# Patient Record
Sex: Female | Born: 1945 | Race: White | Hispanic: Yes | Smoking: Never smoker
Health system: Southern US, Community
[De-identification: ages and names within clinical notes are randomized; demographics above are authoritative.]

## PROBLEM LIST (undated history)

## (undated) DIAGNOSIS — E785 Hyperlipidemia, unspecified: Secondary | ICD-10-CM

## (undated) DIAGNOSIS — R7303 Prediabetes: Secondary | ICD-10-CM

## (undated) DIAGNOSIS — Z9889 Other specified postprocedural states: Secondary | ICD-10-CM

## (undated) DIAGNOSIS — M1990 Unspecified osteoarthritis, unspecified site: Secondary | ICD-10-CM

## (undated) DIAGNOSIS — R112 Nausea with vomiting, unspecified: Secondary | ICD-10-CM

## (undated) DIAGNOSIS — I10 Essential (primary) hypertension: Secondary | ICD-10-CM

## (undated) DIAGNOSIS — K649 Unspecified hemorrhoids: Secondary | ICD-10-CM

## (undated) DIAGNOSIS — G510 Bell's palsy: Secondary | ICD-10-CM

## (undated) DIAGNOSIS — M810 Age-related osteoporosis without current pathological fracture: Secondary | ICD-10-CM

## (undated) HISTORY — DX: Unspecified hemorrhoids: K64.9

## (undated) HISTORY — DX: Bell's palsy: G51.0

## (undated) HISTORY — DX: Hyperlipidemia, unspecified: E78.5

## (undated) HISTORY — DX: Prediabetes: R73.03

## (undated) HISTORY — DX: Age-related osteoporosis without current pathological fracture: M81.0

## (undated) HISTORY — DX: Essential (primary) hypertension: I10

---

## 2006-10-15 ENCOUNTER — Emergency Department (HOSPITAL_COMMUNITY): Admission: EM | Admit: 2006-10-15 | Discharge: 2006-10-16 | Payer: Self-pay | Admitting: Emergency Medicine

## 2009-03-28 ENCOUNTER — Ambulatory Visit: Payer: Self-pay | Admitting: Family Medicine

## 2009-04-06 ENCOUNTER — Ambulatory Visit: Payer: Self-pay | Admitting: *Deleted

## 2013-04-09 DIAGNOSIS — Z131 Encounter for screening for diabetes mellitus: Secondary | ICD-10-CM | POA: Diagnosis not present

## 2013-04-09 DIAGNOSIS — Z13220 Encounter for screening for lipoid disorders: Secondary | ICD-10-CM | POA: Diagnosis not present

## 2013-04-09 DIAGNOSIS — I10 Essential (primary) hypertension: Secondary | ICD-10-CM | POA: Diagnosis not present

## 2013-04-09 DIAGNOSIS — M2550 Pain in unspecified joint: Secondary | ICD-10-CM | POA: Diagnosis not present

## 2013-05-10 DIAGNOSIS — Z1211 Encounter for screening for malignant neoplasm of colon: Secondary | ICD-10-CM | POA: Diagnosis not present

## 2013-05-10 DIAGNOSIS — M2550 Pain in unspecified joint: Secondary | ICD-10-CM | POA: Diagnosis not present

## 2013-05-10 DIAGNOSIS — E669 Obesity, unspecified: Secondary | ICD-10-CM | POA: Diagnosis not present

## 2013-05-10 DIAGNOSIS — I10 Essential (primary) hypertension: Secondary | ICD-10-CM | POA: Diagnosis not present

## 2013-08-09 DIAGNOSIS — I10 Essential (primary) hypertension: Secondary | ICD-10-CM | POA: Diagnosis not present

## 2013-08-09 DIAGNOSIS — Z1239 Encounter for other screening for malignant neoplasm of breast: Secondary | ICD-10-CM | POA: Diagnosis not present

## 2013-08-09 DIAGNOSIS — M2550 Pain in unspecified joint: Secondary | ICD-10-CM | POA: Diagnosis not present

## 2013-08-09 DIAGNOSIS — Z23 Encounter for immunization: Secondary | ICD-10-CM | POA: Diagnosis not present

## 2013-08-18 DIAGNOSIS — E782 Mixed hyperlipidemia: Secondary | ICD-10-CM | POA: Diagnosis not present

## 2013-08-18 DIAGNOSIS — I10 Essential (primary) hypertension: Secondary | ICD-10-CM | POA: Diagnosis not present

## 2013-08-18 DIAGNOSIS — Z0000 Encounter for general adult medical examination without abnormal findings: Secondary | ICD-10-CM | POA: Diagnosis not present

## 2013-08-18 DIAGNOSIS — M1710 Unilateral primary osteoarthritis, unspecified knee: Secondary | ICD-10-CM | POA: Diagnosis not present

## 2013-08-18 DIAGNOSIS — M25519 Pain in unspecified shoulder: Secondary | ICD-10-CM | POA: Diagnosis not present

## 2013-09-08 ENCOUNTER — Other Ambulatory Visit: Payer: Self-pay

## 2013-09-08 DIAGNOSIS — Z1231 Encounter for screening mammogram for malignant neoplasm of breast: Secondary | ICD-10-CM

## 2013-10-01 ENCOUNTER — Ambulatory Visit
Admission: RE | Admit: 2013-10-01 | Discharge: 2013-10-01 | Disposition: A | Discharge status: Home / Self Care | Payer: Medicare Other | Source: Ambulatory Visit

## 2013-10-01 DIAGNOSIS — Z1231 Encounter for screening mammogram for malignant neoplasm of breast: Secondary | ICD-10-CM

## 2013-10-05 ENCOUNTER — Other Ambulatory Visit: Payer: Self-pay | Admitting: Internal Medicine

## 2013-10-05 DIAGNOSIS — R928 Other abnormal and inconclusive findings on diagnostic imaging of breast: Secondary | ICD-10-CM

## 2013-10-25 DIAGNOSIS — I10 Essential (primary) hypertension: Secondary | ICD-10-CM | POA: Diagnosis not present

## 2013-10-25 DIAGNOSIS — F430 Acute stress reaction: Secondary | ICD-10-CM | POA: Diagnosis not present

## 2013-10-25 DIAGNOSIS — M2550 Pain in unspecified joint: Secondary | ICD-10-CM | POA: Diagnosis not present

## 2013-10-25 DIAGNOSIS — J3089 Other allergic rhinitis: Secondary | ICD-10-CM | POA: Diagnosis not present

## 2013-11-01 DIAGNOSIS — M2550 Pain in unspecified joint: Secondary | ICD-10-CM | POA: Diagnosis not present

## 2013-11-01 DIAGNOSIS — F430 Acute stress reaction: Secondary | ICD-10-CM | POA: Diagnosis not present

## 2013-11-01 DIAGNOSIS — I10 Essential (primary) hypertension: Secondary | ICD-10-CM | POA: Diagnosis not present

## 2013-11-19 ENCOUNTER — Ambulatory Visit
Admission: RE | Admit: 2013-11-19 | Discharge: 2013-11-19 | Disposition: A | Discharge status: Home / Self Care | Payer: Medicare Other | Source: Ambulatory Visit | Attending: Internal Medicine | Admitting: Internal Medicine

## 2013-11-19 ENCOUNTER — Other Ambulatory Visit: Payer: Self-pay | Admitting: Internal Medicine

## 2013-11-19 ENCOUNTER — Ambulatory Visit
Admission: RE | Admit: 2013-11-19 | Discharge: 2013-11-19 | Disposition: A | Discharge status: Home / Self Care | Payer: Medicaid Other | Source: Ambulatory Visit | Attending: Internal Medicine | Admitting: Internal Medicine

## 2013-11-19 DIAGNOSIS — R928 Other abnormal and inconclusive findings on diagnostic imaging of breast: Secondary | ICD-10-CM

## 2013-11-19 DIAGNOSIS — N6009 Solitary cyst of unspecified breast: Secondary | ICD-10-CM | POA: Diagnosis not present

## 2013-11-19 DIAGNOSIS — N6002 Solitary cyst of left breast: Secondary | ICD-10-CM

## 2013-11-30 ENCOUNTER — Ambulatory Visit
Admission: RE | Admit: 2013-11-30 | Discharge: 2013-11-30 | Disposition: A | Discharge status: Home / Self Care | Payer: Medicare Other | Source: Ambulatory Visit | Attending: Internal Medicine | Admitting: Internal Medicine

## 2013-11-30 DIAGNOSIS — N6002 Solitary cyst of left breast: Secondary | ICD-10-CM

## 2013-11-30 DIAGNOSIS — N6009 Solitary cyst of unspecified breast: Secondary | ICD-10-CM | POA: Diagnosis not present

## 2013-12-12 ENCOUNTER — Emergency Department (INDEPENDENT_AMBULATORY_CARE_PROVIDER_SITE_OTHER)
Admission: EM | Admit: 2013-12-12 | Discharge: 2013-12-12 | Disposition: A | Discharge status: Home / Self Care | Payer: Medicare Other | Source: Home / Self Care | Attending: Family Medicine | Admitting: Family Medicine

## 2013-12-12 ENCOUNTER — Encounter (HOSPITAL_COMMUNITY): Payer: Self-pay | Admitting: Emergency Medicine

## 2013-12-12 DIAGNOSIS — F418 Other specified anxiety disorders: Secondary | ICD-10-CM

## 2013-12-12 DIAGNOSIS — F411 Generalized anxiety disorder: Secondary | ICD-10-CM | POA: Diagnosis not present

## 2013-12-12 MED ORDER — CLONAZEPAM 0.5 MG PO TABS: 0.5000 mg | ORAL_TABLET | Freq: Two times a day (BID) | ORAL | Status: DC | PRN

## 2013-12-12 NOTE — Discharge Instructions (Signed)
Take medicine as needed and see your doctor on mon for recheck.

## 2013-12-12 NOTE — ED Provider Notes (Signed)
CSN: [id number]     Arrival date & time 12/12/13  1328 History   First MD Initiated Contact with Patient 12/12/13 1433     Chief Complaint  Patient presents with  . Generalized Body Aches   (Consider location/radiation/quality/duration/timing/severity/associated sxs/prior Treatment) Patient is a 68 y.o. female presenting with neurologic complaint. The history is provided by the patient and a relative.  Neurologic Problem This is a new problem. The current episode started 12 to 24 hours ago (watching tv and began with left sided body painsfrom head to toes, no weakness, no speech or mental status changes.. no n/v. no dizziness.). The problem has not changed since onset.Associated symptoms include chest pain and abdominal pain. Pertinent negatives include no headaches. Nothing aggravates the symptoms. Nothing relieves the symptoms.    History reviewed. No pertinent past medical history. History reviewed. No pertinent past surgical history. History reviewed. No pertinent family history. History  Substance Use Topics  . Smoking status: Not on file  . Smokeless tobacco: Not on file  . Alcohol Use: No   OB History   Grav Para Term Preterm Abortions TAB SAB Ect Mult Living                 Review of Systems  Constitutional: Negative.   HENT: Negative.   Cardiovascular: Positive for chest pain.  Gastrointestinal: Positive for abdominal pain.  Neurological: Negative for dizziness, facial asymmetry, speech difficulty, weakness, numbness and headaches.    Allergies  Review of patient's allergies indicates no known allergies.  Home Medications   Current Outpatient Rx  Name  Route  Sig  Dispense  Refill  . aspirin 81 MG tablet   Oral   Take 81 mg by mouth daily.         Marland Kitchen. losartan (COZAAR) 50 MG tablet   Oral   Take 50 mg by mouth daily.         . clonazePAM (KLONOPIN) 0.5 MG tablet   Oral   Take 1 tablet (0.5 mg total) by mouth 2 (two) times daily as needed for anxiety.  30 tablet   0    There were no vitals taken for this visit. Physical Exam  Nursing note and vitals reviewed. Constitutional: She is oriented to person, place, and time. She appears well-developed and well-nourished.  Eyes: Conjunctivae and EOM are normal. Pupils are equal, round, and reactive to light.  Neck: Trachea normal and normal range of motion. Neck supple. Carotid bruit is not present.  Musculoskeletal: Normal range of motion.  Pulses nl, no edema.  Lymphadenopathy:    She has no cervical adenopathy.  Neurological: She is alert and oriented to person, place, and time. No cranial nerve deficit. She exhibits normal muscle tone. Coordination normal.  Skin: Skin is warm and dry.    ED Course  Procedures (including critical care time) Labs Review Labs Reviewed - No data to display Imaging Review No results found.  EKG Interpretation    Date/Time:    Ventricular Rate:    PR Interval:    QRS Duration:   QT Interval:    QTC Calculation:   R Axis:     Text Interpretation:              MDM   1. Situational anxiety       Linna HoffJames D Kindl, MD 12/12/13 1505

## 2013-12-12 NOTE — ED Notes (Addendum)
pT  REPORTS  NECK PAIN   WITH  PAIN  DOWN L  SIDE  OF  BODY          FROM TOP  OF  HER HEAD  TO THE  TIP OF HIS  TOES          SHE  DESCIBES  THE  SENSATION AS  PINS  AND  NEEDLES            SHE DENYS  ANY  SPECEFIC  INJURY   Symptoms  Started  Last pm

## 2013-12-22 DIAGNOSIS — Z1211 Encounter for screening for malignant neoplasm of colon: Secondary | ICD-10-CM | POA: Diagnosis not present

## 2014-08-25 DIAGNOSIS — Z13220 Encounter for screening for lipoid disorders: Secondary | ICD-10-CM | POA: Diagnosis not present

## 2014-08-25 DIAGNOSIS — N3281 Overactive bladder: Secondary | ICD-10-CM | POA: Diagnosis not present

## 2014-08-25 DIAGNOSIS — K649 Unspecified hemorrhoids: Secondary | ICD-10-CM | POA: Diagnosis not present

## 2014-08-25 DIAGNOSIS — Z23 Encounter for immunization: Secondary | ICD-10-CM | POA: Diagnosis not present

## 2014-08-25 DIAGNOSIS — M25572 Pain in left ankle and joints of left foot: Secondary | ICD-10-CM | POA: Diagnosis not present

## 2014-08-25 DIAGNOSIS — I10 Essential (primary) hypertension: Secondary | ICD-10-CM | POA: Diagnosis not present

## 2014-08-25 DIAGNOSIS — Z131 Encounter for screening for diabetes mellitus: Secondary | ICD-10-CM | POA: Diagnosis not present

## 2015-02-21 ENCOUNTER — Other Ambulatory Visit: Payer: Self-pay

## 2015-02-21 DIAGNOSIS — Z1231 Encounter for screening mammogram for malignant neoplasm of breast: Secondary | ICD-10-CM

## 2015-02-23 ENCOUNTER — Ambulatory Visit
Admission: RE | Admit: 2015-02-23 | Discharge: 2015-02-23 | Disposition: A | Discharge status: Home / Self Care | Payer: Medicare Other | Source: Ambulatory Visit

## 2015-02-23 DIAGNOSIS — Z1231 Encounter for screening mammogram for malignant neoplasm of breast: Secondary | ICD-10-CM

## 2015-02-24 ENCOUNTER — Ambulatory Visit: Payer: Medicare Other

## 2015-04-03 DIAGNOSIS — H3530 Unspecified macular degeneration: Secondary | ICD-10-CM | POA: Diagnosis not present

## 2015-09-05 DIAGNOSIS — K5900 Constipation, unspecified: Secondary | ICD-10-CM | POA: Diagnosis not present

## 2015-09-05 DIAGNOSIS — Z131 Encounter for screening for diabetes mellitus: Secondary | ICD-10-CM | POA: Diagnosis not present

## 2015-09-05 DIAGNOSIS — G4700 Insomnia, unspecified: Secondary | ICD-10-CM | POA: Diagnosis not present

## 2015-09-05 DIAGNOSIS — K648 Other hemorrhoids: Secondary | ICD-10-CM | POA: Diagnosis not present

## 2015-09-05 DIAGNOSIS — Z13220 Encounter for screening for lipoid disorders: Secondary | ICD-10-CM | POA: Diagnosis not present

## 2015-09-05 DIAGNOSIS — J302 Other seasonal allergic rhinitis: Secondary | ICD-10-CM | POA: Diagnosis not present

## 2015-09-05 DIAGNOSIS — I10 Essential (primary) hypertension: Secondary | ICD-10-CM | POA: Diagnosis not present

## 2015-09-05 DIAGNOSIS — Z23 Encounter for immunization: Secondary | ICD-10-CM | POA: Diagnosis not present

## 2015-10-20 DIAGNOSIS — E2839 Other primary ovarian failure: Secondary | ICD-10-CM | POA: Diagnosis not present

## 2015-10-20 DIAGNOSIS — N760 Acute vaginitis: Secondary | ICD-10-CM | POA: Diagnosis not present

## 2015-10-20 DIAGNOSIS — I10 Essential (primary) hypertension: Secondary | ICD-10-CM | POA: Diagnosis not present

## 2015-10-20 DIAGNOSIS — K648 Other hemorrhoids: Secondary | ICD-10-CM | POA: Diagnosis not present

## 2015-10-20 DIAGNOSIS — Z124 Encounter for screening for malignant neoplasm of cervix: Secondary | ICD-10-CM | POA: Diagnosis not present

## 2015-10-27 ENCOUNTER — Other Ambulatory Visit: Payer: Self-pay | Admitting: Internal Medicine

## 2015-10-27 DIAGNOSIS — E2839 Other primary ovarian failure: Secondary | ICD-10-CM

## 2015-11-03 ENCOUNTER — Ambulatory Visit
Admission: RE | Admit: 2015-11-03 | Discharge: 2015-11-03 | Disposition: A | Discharge status: Home / Self Care | Payer: Medicare Other | Source: Ambulatory Visit | Attending: Internal Medicine | Admitting: Internal Medicine

## 2015-11-03 DIAGNOSIS — E2839 Other primary ovarian failure: Secondary | ICD-10-CM

## 2015-11-03 DIAGNOSIS — M810 Age-related osteoporosis without current pathological fracture: Secondary | ICD-10-CM | POA: Diagnosis not present

## 2015-11-06 ENCOUNTER — Other Ambulatory Visit: Payer: Medicare Other

## 2015-11-19 HISTORY — PX: EYE SURGERY: SHX253

## 2016-01-23 DIAGNOSIS — K648 Other hemorrhoids: Secondary | ICD-10-CM | POA: Diagnosis not present

## 2016-01-23 DIAGNOSIS — M810 Age-related osteoporosis without current pathological fracture: Secondary | ICD-10-CM | POA: Diagnosis not present

## 2016-01-23 DIAGNOSIS — L259 Unspecified contact dermatitis, unspecified cause: Secondary | ICD-10-CM | POA: Diagnosis not present

## 2016-01-23 DIAGNOSIS — I10 Essential (primary) hypertension: Secondary | ICD-10-CM | POA: Diagnosis not present

## 2016-01-23 DIAGNOSIS — J302 Other seasonal allergic rhinitis: Secondary | ICD-10-CM | POA: Diagnosis not present

## 2016-03-19 ENCOUNTER — Ambulatory Visit: Payer: Medicare Other | Admitting: Family Medicine

## 2016-04-02 ENCOUNTER — Ambulatory Visit: Payer: Medicare Other | Admitting: Family Medicine

## 2016-04-04 ENCOUNTER — Other Ambulatory Visit: Payer: Self-pay

## 2016-04-10 ENCOUNTER — Encounter: Payer: Self-pay | Admitting: Family Medicine

## 2016-04-10 ENCOUNTER — Other Ambulatory Visit: Payer: Self-pay

## 2016-04-10 ENCOUNTER — Ambulatory Visit (INDEPENDENT_AMBULATORY_CARE_PROVIDER_SITE_OTHER): Payer: Medicare Other | Admitting: Family Medicine

## 2016-04-10 VITALS — BP 147/71 | HR 68 | Temp 98.5°F | Resp 16 | Ht <= 58 in | Wt 159.0 lb

## 2016-04-10 DIAGNOSIS — Z91048 Other nonmedicinal substance allergy status: Secondary | ICD-10-CM

## 2016-04-10 DIAGNOSIS — K5900 Constipation, unspecified: Secondary | ICD-10-CM

## 2016-04-10 DIAGNOSIS — E781 Pure hyperglyceridemia: Secondary | ICD-10-CM

## 2016-04-10 DIAGNOSIS — M1990 Unspecified osteoarthritis, unspecified site: Secondary | ICD-10-CM | POA: Insufficient documentation

## 2016-04-10 DIAGNOSIS — E669 Obesity, unspecified: Secondary | ICD-10-CM | POA: Insufficient documentation

## 2016-04-10 DIAGNOSIS — I10 Essential (primary) hypertension: Secondary | ICD-10-CM | POA: Diagnosis not present

## 2016-04-10 DIAGNOSIS — Z23 Encounter for immunization: Secondary | ICD-10-CM | POA: Diagnosis not present

## 2016-04-10 DIAGNOSIS — R739 Hyperglycemia, unspecified: Secondary | ICD-10-CM | POA: Diagnosis not present

## 2016-04-10 DIAGNOSIS — Z1239 Encounter for other screening for malignant neoplasm of breast: Secondary | ICD-10-CM

## 2016-04-10 DIAGNOSIS — Z1231 Encounter for screening mammogram for malignant neoplasm of breast: Secondary | ICD-10-CM

## 2016-04-10 DIAGNOSIS — Z789 Other specified health status: Secondary | ICD-10-CM

## 2016-04-10 DIAGNOSIS — M810 Age-related osteoporosis without current pathological fracture: Secondary | ICD-10-CM

## 2016-04-10 DIAGNOSIS — M129 Arthropathy, unspecified: Secondary | ICD-10-CM | POA: Diagnosis not present

## 2016-04-10 DIAGNOSIS — R7309 Other abnormal glucose: Secondary | ICD-10-CM | POA: Diagnosis not present

## 2016-04-10 DIAGNOSIS — Z9109 Other allergy status, other than to drugs and biological substances: Secondary | ICD-10-CM | POA: Insufficient documentation

## 2016-04-10 DIAGNOSIS — G4700 Insomnia, unspecified: Secondary | ICD-10-CM

## 2016-04-10 LAB — POCT URINALYSIS DIP (DEVICE)
BILIRUBIN URINE: NEGATIVE
Glucose, UA: NEGATIVE mg/dL
HGB URINE DIPSTICK: NEGATIVE
KETONES UR: NEGATIVE mg/dL
Nitrite: NEGATIVE
Protein, ur: NEGATIVE mg/dL
SPECIFIC GRAVITY, URINE: 1.015 (ref 1.005–1.030)
Urobilinogen, UA: 0.2 mg/dL (ref 0.0–1.0)
pH: 7.5 (ref 5.0–8.0)

## 2016-04-10 LAB — COMPLETE METABOLIC PANEL WITH GFR
ALBUMIN: 4 g/dL (ref 3.6–5.1)
ALK PHOS: 61 U/L (ref 33–130)
ALT: 18 U/L (ref 6–29)
AST: 23 U/L (ref 10–35)
BILIRUBIN TOTAL: 0.4 mg/dL (ref 0.2–1.2)
BUN: 16 mg/dL (ref 7–25)
CO2: 21 mmol/L (ref 20–31)
Calcium: 8.4 mg/dL — ABNORMAL LOW (ref 8.6–10.4)
Chloride: 106 mmol/L (ref 98–110)
Creat: 0.54 mg/dL (ref 0.50–0.99)
GFR, Est African American: >89 mL/min (ref >=60)
GFR, Est Non African American: >89 mL/min (ref >=60)
GLUCOSE: 98 mg/dL (ref 65–99)
POTASSIUM: 3.7 mmol/L (ref 3.5–5.3)
SODIUM: 139 mmol/L (ref 135–146)
TOTAL PROTEIN: 7.3 g/dL (ref 6.1–8.1)

## 2016-04-10 LAB — CBC WITH DIFFERENTIAL/PLATELET
BASOS ABS: 0 {cells}/uL (ref 0–200)
Basophils Relative: 0 %
EOS PCT: 1 %
Eosinophils Absolute: 68 cells/uL (ref 15–500)
HCT: 36.9 % (ref 35.0–45.0)
HEMOGLOBIN: 11.8 g/dL (ref 11.7–15.5)
LYMPHS ABS: 2448 {cells}/uL (ref 850–3900)
Lymphocytes Relative: 36 %
MCH: 26 pg — AB (ref 27.0–33.0)
MCHC: 32 g/dL (ref 32.0–36.0)
MCV: 81.5 fL (ref 80.0–100.0)
MPV: 9.7 fL (ref 7.5–12.5)
Monocytes Absolute: 476 cells/uL (ref 200–950)
Monocytes Relative: 7 %
NEUTROS PCT: 56 %
Neutro Abs: 3808 cells/uL (ref 1500–7800)
Platelets: 238 10*3/uL (ref 140–400)
RBC: 4.53 MIL/uL (ref 3.80–5.10)
RDW: 16.1 % — ABNORMAL HIGH (ref 11.0–15.0)
WBC: 6.8 10*3/uL (ref 3.8–10.8)

## 2016-04-10 LAB — HEMOGLOBIN A1C
Hgb A1c MFr Bld: 6.1 % — ABNORMAL HIGH (ref <5.7)
MEAN PLASMA GLUCOSE: 128 mg/dL

## 2016-04-10 LAB — SEDIMENTATION RATE: SED RATE: 11 mm/h (ref 0–30)

## 2016-04-10 LAB — C-REACTIVE PROTEIN: CRP: <0.5 mg/dL (ref <0.60)

## 2016-04-10 LAB — GLUCOSE, CAPILLARY: Glucose-Capillary: 107 mg/dL — ABNORMAL HIGH (ref 65–99)

## 2016-04-10 MED ORDER — CELECOXIB 100 MG PO CAPS: 100.0000 mg | ORAL_CAPSULE | Freq: Two times a day (BID) | ORAL | Status: DC

## 2016-04-10 MED ORDER — ALENDRONATE SODIUM 70 MG PO TABS: 70.0000 mg | ORAL_TABLET | ORAL | Status: DC

## 2016-04-10 MED ORDER — CETIRIZINE HCL 10 MG PO TABS: 10.0000 mg | ORAL_TABLET | Freq: Every day | ORAL | Status: DC

## 2016-04-10 NOTE — Patient Instructions (Addendum)
Centrum Silver OTC Multivitain Will add Celibrex 100 mg twice daily for arthritis pain Will continue Cetirizine 10 mg daily for environmental allergies Will continue Amitriptyline 10 mg at night for insomnia as needed.   Artritis (Arthritis) El significado del trmino artritis es Engineer, mining de las articulaciones. Tambin puede significar enfermedad articular. Una articulacin es Immunologist de unin de [place]. Las personas que sufren artritis pueden tener lo siguiente:  Enrojecimiento de las articulaciones.  Inflamaciones articulares.  Articulaciones rgidas.  Articulaciones calientes.  Grant Ruts.  Sensacin de estar enfermo. CUIDADOS EN EL HOGAR Est atento a cualquier cambio en los sntomas. Tome estas medidas para Engineer, materials y la hinchazn. Medicamentos  [employer] de venta libre y los recetados solamente como se lo haya indicado el mdico.  No tome aspirina para Engineer, materials si el mdico le dice que puede tener gota. Actividades  Ponga la articulacin en reposo si el mdico se lo indica.  Evite las actividades que intensifiquen Chief Technology Officer.  Haga ejercicios con la articulacin como se lo haya indicado el mdico. Intente hacer ejercicios tales como:  Natacin.  Gimnasia acutica.  Andar en bicicleta.  Caminar. Cuidado de la articulacin  Si la articulacin est hinchada, mantngala elevada si el mdico se lo indic.  Si al despertar por la maana, nota que la articulacin est rgida, intente tomar una ducha con agua tibia.  Si es diabtico, no se ponga calor sin consultarle al mdico.  Si se lo indican, pngase calor en la articulacin:  Coloque una toalla entre la articulacin y la compresa caliente o la almohadilla trmica.  Coloque el calor en la zona durante 20 o .  Si se lo indican, pngase hielo en la articulacin:  Ponga el hielo en una bolsa plstica.  Coloque una toalla entre la piel y la bolsa de hielo.  Coloque el hielo  durante , 2 a 3veces por Futures trader.  Concurra a todas las visitas de control como se lo haya indicado el mdico. SOLICITE AYUDA SI:  El dolor empeora.  Tiene fiebre. SOLICITE AYUDA DE INMEDIATO SI:  Siente un dolor muy intenso en la articulacin.  Se le hincha la articulacin.  La articulacin est enrojecida.  Siente dolor en muchas articulaciones y se le hinchan.  Siente un dolor muy intenso en la espalda.  Tiene la pierna muy dbil.  No puede controlar la miccin (orina) o la defecacin (materia fecal).   Esta informacin no tiene Theme park manager el consejo del mdico. Asegrese de hacerle al mdico cualquier pregunta que tenga.   Document Released: 05/05/2012 Document Revised: 07/26/2015 Elsevier Interactive Patient Education 2016 [employer]. [place] (Allergies) La alergia ocurre cuando el cuerpo reacciona a una sustancia de un modo que no es normal. Una reaccin alrgica puede producirse despus de cualquiera de estas acciones:  Comer algo.  Inhalar algo.  Tocar algo. CULES SON LAS CLASES DE ALERGIAS? Se puede ser alrgico a lo siguiente:  Cosas que surgen solo durante ciertas temporadas, como moho y [place].  Alimentos.  Medicamentos.  Insectos.  Caspa de los [place]. CULES SON LOS SNTOMAS DE LAS ALERGIAS?  Hinchazn. Puede aparecer en los labios, la cara, la lengua, la boca o la garganta.  Estornudos.  Tos.  Respiracin ruidosa (sibilancias).  Nariz tapada.  Hormigueo en la boca.  Una erupcin.  Picazn.  Zonas de piel hinchadas, rojas y que producen picazn (ronchas).  Lagrimeo.  Vmitos.  Deposiciones lquidas (diarrea).  Mareos.  Desmayo o sensacin de desvanecimiento.  Problemas para respirar o tragar.  Sensacin de opresin en el pecho.  Latidos cardacos acelerados. CMO SE DIAGNOSTICAN LAS ALERGIAS? Las alergias pueden diagnosticarse mediante lo siguiente:  Antecedentes mdicos y  familiares.  Pruebas cutneas.  Anlisis de [place].  Un registro de alimentos. Un registro de [employer], las bebidas y los sntomas diarios.  Los resultados de una dieta de eliminacin. Esta dieta implica asegurarse de no comer determinados alimentos y [employer] ver qu sucede cuando comienza a comerlos de nuevo. CMO SE TRATAN LAS ALERGIAS? No hay una cura para las [place], pero las reacciones alrgicas pueden tratarse con medicamentos. Generalmente, las reacciones graves deben tratarse en un hospital.  CMO PUEDEN PREVENIRSE LAS REACCIONES? La mejor manera de prevenir una reaccin alrgica es evitar el elemento que le causa Programmer, multimedia. Las vacunas y los medicamentos para la alergia tambin pueden ayudar a prevenir las reacciones en [employer].   Esta informacin no tiene Theme park manager el consejo del mdico. Asegrese de hacerle al mdico cualquier pregunta que tenga.   Document Released: 07/07/2013 Document Revised: 11/25/2014 Elsevier Interactive Patient Education 2016 [employer]. Dolor de rodilla (Knee Pain) El dolor de rodilla es un sntoma muy comn y puede tener muchas causas. Suele desaparecer cuando se siguen las indicaciones del mdico en lo que respecta a Engineer, materials y las molestias en la casa. Sin embargo, puede Scientist, product/process development en una afeccin que requiere [place]. Algunas afecciones pueden incluir lo siguiente:  Artritis por uso y Chiropractor (artrosis).  Artritis por hinchazn e irritacin (artritis reumatoide o gota).  Un quiste o un crecimiento en la rodilla.  Una infeccin en la articulacin de la rodilla.  Una lesin que no se [place].  Dao, hinchazn o irritacin de los tejidos que sostienen la rodilla (distensin de ligamentos o tendinitis). Si el dolor de Paramedic, tal vez haya que realizar ms estudios para Scientist, forensic, los cuales pueden incluir radiografas u otros estudios de diagnstico  por imgenes de la rodilla. Adems, es posible que haya que extraerle lquido de la rodilla. El tratamiento del dolor continuo de rodilla depende de la causa, pero puede incluir lo siguiente:  Medicamentos para Engineer, materials o reducir la hinchazn.  Inyecciones de corticoides en la rodilla.  Fisioterapia.  [place]. INSTRUCCIONES PARA EL CUIDADO EN EL HOGAR  Tome los medicamentos solamente como se lo haya indicado el mdico.  Mantenga la rodilla en reposo y en alto (elevada) mientras est descansando.  No haga cosas que le causen dolor o que lo intensifiquen.  Evite las actividades o los ejercicios de alto impacto, como correr, Public relations account executive soga o hacer saltos de tijera.  Aplique hielo sobre la zona de la rodilla:  Ponga el hielo en una bolsa plstica.  Coloque una toalla entre la piel y la bolsa de hielo.  Coloque el hielo durante 20 minutos, 2 a 3 veces por da.  Pregntele al mdico si debe usar una Neurosurgeon.  Cuando duerma, pngase una almohada debajo de la rodilla.  Baje de peso si es necesario. [place] extra puede generar presin en la rodilla.  No consuma ningn producto que contenga tabaco, lo que incluye cigarrillos, tabaco de Theatre manager o Administrator, Civil Service. Si necesita ayuda para dejar de fumar, consulte al mdico. Fumar puede retrasar la curacin de cualquier problema que tenga en el hueso y Nurse, learning disability. SOLICITE ATENCIN MDICA SI:  El dolor de rodilla contina, [place].  Tiene fiebre junto con dolor de  rodilla.  La rodilla se le tuerce o se le traba.  La rodilla est ms hinchada. SOLICITE ATENCIN MDICA DE INMEDIATO SI:   La articulacin de la rodilla est caliente al tacto.  Tiene dolor en el pecho o dificultad para respirar.   Esta informacin no tiene Theme park manager el consejo del mdico. Asegrese de hacerle al mdico cualquier pregunta que tenga.   Document Released: 04/22/2008 Document Revised: 11/25/2014 Elsevier  Interactive Patient Education 2016 [employer]. Dieta restringida en grasas y colesterol (Fat and Cholesterol Restricted Diet) El exceso de grasas y colesterol en la dieta puede causar problemas de [place]. Esta dieta lo ayudar a Pharmacologist las grasas y Print production planner en los niveles normales para evitar enfermarse. QU TIPOS DE GRASAS DEBO ELEGIR?  Elija grasas monosaturadas y polinsaturadas. Estas se encuentran en alimentos como el aceite de oliva, aceite de canola, semillas de lino, nueces, almendras y semillas.  Consuma ms grasas omega-3. Las mejores opciones incluyen salmn, caballa, sardinas, atn, aceite de lino y semillas de lino molidas.  Limite el consumo de grasas saturadas, que se encuentran en productos de origen animal, como carnes, mantequilla y crema. Tambin pueden estar en productos vegetales, como aceite de palma, de palmiste y de coco.   Evite los alimentos con aceites parcialmente hidrogenados. Estos contienen grasas trans. Entre los ejemplos de alimentos con grasas trans se incluyen margarinas en barra, algunas margarinas untables, galletas dulces o saladas y otros productos horneados. QU PAUTAS GENERALES DEBO SEGUIR?   Lea las etiquetas de los alimentos. Busque las palabras "grasas trans" y "grasas saturadas".  Al preparar una comida:  Llene la mitad del plato con verduras y ensaladas de hojas verdes.  Llene un cuarto del plato con cereales integrales. Busque la palabra "integral" en Estate agent de la lista de ingredientes.  Llene un cuarto del plato con alimentos con protenas magras.  Limite las frutas a dos porciones por da. Elija frutas en lugar de jugos.  Coma ms alimentos con fibra soluble, por ejemplo, manzanas, brcoli, zanahorias, frijoles, guisantes y [place]. Trate de consumir de 20a 30g (gramos) de Firefighter.  Coma ms comidas caseras. Coma menos en los restaurantes y los bares.  Limite o evite el alcohol.  Limite los alimentos con alto  contenido de almidn y International aid/development worker.  Limite el consumo de alimentos fritos.  Cocine los alimentos sin frerlos. Las opciones de coccin ms [place] son Development worker, community, Regulatory affairs officer, Software engineer y asar a Patent attorney.  Baje de peso si es necesario. Aunque pierda [employer], esto puede ser importante para la salud general. Tambin puede ayudar a prevenir enfermedades como diabetes y enfermedad cardaca. QU ALIMENTOS PUEDO COMER? Cereales Cereales integrales, como los panes de salvado o [place], las [place], los cereales y las pastas. Avena sin endulzar, trigo, [place], quinua o arroz integral. Tortillas de harina de maz o de salvado. Verduras Verduras frescas o congeladas (crudas, al vapor, asadas o grilladas). Ensaladas de hojas verdes. Nils Pyle Nils Pyle frescas, en conserva (en su jugo natural) o frutas congeladas. Carnes y otros productos con protenas Carne de res molida (al 85% o ms [place]), carne de res de animales alimentados con pastos o carne de res sin la grasa. Pollo o pavo sin piel. Carne de pollo o de [place]. Cerdo sin la grasa. Todos los pescados y frutos de mar. Huevos. Porotos, guisantes o lentejas secos. Frutos secos o semillas sin sal. Frijoles secos o en lata sin sal. Lcteos Productos lcteos con bajo contenido de [place], como [place]  descremada o al 1%, quesos reducidos en grasas o al 2%, ricota con bajo contenido de grasas o Leggett & Plattqueso cottage, o yogur natural con bajo contenido de [place] Cornergrasas. Grasas y Hershey Companyaceites Margarinas untables que no contengan grasas trans. [place] y condimentos para ensaladas livianos o reducidos en grasas. [place]. Aceites de oliva, canola, ssamo o crtamo. Mantequilla natural de cacahuate o almendra (elija la que no tenga agregado de aceite o azcar). Los artculos mencionados arriba pueden no ser Raytheonuna lista completa de las bebidas o los alimentos recomendados. Comunquese con el nutricionista para conocer ms opciones. QU ALIMENTOS NO SE RECOMIENDAN? Cereales Pan  blanco. Pastas blancas. Arroz blanco. Pan de maz. Bagels, pasteles y croissants. Galletas saladas que contengan grasas trans. Verduras Papas blancas. MazHoover Brunette. Verduras con crema o fritas. Verduras en salsa de [place]. Nils PyleFrutas Frutas secas. Fruta enlatada en almbar liviano o espeso. Jugo de frutas. Carnes y otros productos con protenas [place] de carne con Holiday representativegrasa. Costillas, alas de pollo, tocineta, salchicha, mortadela, salame, chinchulines, tocino, perros calientes, salchichas alemanas y embutidos envasados. Hgado y otros rganos. Lcteos Leche entera o al 2%, crema, mezcla de [place] y crema, y queso crema. Quesos enteros. Yogur entero o endulzado. Quesos con toda su grasa. Cremas no lcteas y coberturas batidas. Quesos procesados, quesos para untar o cuajadas. Dulces y postres Jarabe de maz, azcares, miel y Radio broadcast assistantmelazas. Caramelos. Mermelada y Kazakhstanjalea. Doreen BeamJarabe. Cereales endulzados. Galletas, pasteles, bizcochuelos, donas, muffins y helado. Grasas y [address] Mainaceites Mantequilla, Indiamargarina en barra, Jackson Centermanteca de Emmetcerdo, Bell Arthurgrasa, Singaporemantequilla clarificada o grasa de tocino. Aceites de coco, de palmiste o de palma. Bebidas Alcohol. Bebidas endulzadas (como refrescos, limonadas y bebidas frutales o ponches). Los artculos mencionados arriba pueden no ser Raytheonuna lista completa de las bebidas y los alimentos que se Theatre stage managerdeben evitar. Comunquese con el nutricionista para obtener ms informacin.   Esta informacin no tiene Theme park managercomo fin reemplazar el consejo del mdico. Asegrese de hacerle al mdico cualquier pregunta que tenga.   Document Released: 11/04/2005 Document Revised: 11/25/2014 Elsevier Interactive Patient Education [employer] Inc2016 Elsevier Inc.

## 2016-04-10 NOTE — Progress Notes (Signed)
Subjective:    Patient ID: Elizabeth Jennings, female    DOB: Oct 21, 1946, 70 y.o.   MRN: [id number]  HPI  [name], a 70 year old female presents to establish care. Patient primariily speaks spanish, utilizing video interpreter and patient's niece to assist with communication. [name] says that she was a patient of Dr. Fleet Contras prior to establishing care.  Patient has a history of hypertension. She has been taking Losartan 50 mg consistently over the past several months. She does not check blood pressures at home.  She is not exercising and is not adherent to low salt diet.  Patient denies chest pain, dyspnea, fatigue, lower extremity edema, palpitations, syncope and tachypnea.  Cardiovascular risk factors include: obesity (BMI >= 30 kg/m2) and sedentary lifestyle.   Patient is also complaining of pain primarily involving the right knee. She also complains of joint pains in hands and elbows. Pain has been increasing over the past several months. She complains of stiffness in am. Also, pain interferes with sleeping, she has been taking Amitriptyline at night to induce sleep. Pain is worsened when transitioning from sitting to standing. She has not attempted any OTC interventions to alleviate pain. Her current pain intensity is 4/10 described as intermittent and aching.  She denies, fever, fatigue, dysuria, or skin changes.   [name] also has a history of allergic rhinitis. Symptoms typically occur with season changes. She complains of itchy, watery eyes and runny nose.   Current triggers include exposure to pollens, dust and mold.  The patient has tried prescription antihistamines with satisfactory relief of symptoms.   Past Medical History  Diagnosis Date  . Osteoporosis    Immunization History  Administered Date(s) Administered  . Pneumococcal Conjugate-13 04/10/2016  . Tdap 04/10/2016   Social History   Social History  . Marital Status: Divorced    Spouse Name: N/A   . Number of Children: N/A  . Years of Education: N/A   Occupational History  . Not on file.   Social History Main Topics  . Smoking status: Never Smoker   . Smokeless tobacco: Not on file  . Alcohol Use: No  . Drug Use: No  . Sexual Activity: Not on file   Other Topics Concern  . Not on file   Social History Narrative   Review of Systems  Constitutional: Positive for unexpected weight change (weight gain).  HENT: Negative.   Eyes: Negative.  Negative for photophobia and visual disturbance.  Respiratory: Negative.   Cardiovascular: Negative.  Negative for chest pain, palpitations and leg swelling.  Gastrointestinal: Negative.   Endocrine: Negative.   Genitourinary: Negative.   Musculoskeletal: Positive for arthralgias (Bilateral knee pain).  Skin: Negative.   Allergic/Immunologic: Positive for environmental allergies.  Neurological: Negative.   Hematological: Negative.   Psychiatric/Behavioral: Negative.  The patient is not nervous/anxious.        Objective:   Physical Exam  Constitutional: She is oriented to person, place, and time. She appears well-developed and well-nourished.  HENT:  Head: Normocephalic and atraumatic.  Right Ear: External ear normal.  Left Ear: External ear normal.  Nose: Rhinorrhea present. Right sinus exhibits no maxillary sinus tenderness and no frontal sinus tenderness. Left sinus exhibits no frontal sinus tenderness.  Mouth/Throat: Oropharynx is clear and moist.  Increased cerumen to left ear canal  Eyes: Conjunctivae and EOM are normal. Pupils are equal, round, and reactive to light.  Neck: Normal range of motion. Neck supple.  Cardiovascular: Normal rate, regular rhythm,  normal heart sounds and intact distal pulses.   Pulmonary/Chest: Effort normal and breath sounds normal.  Abdominal: Soft. Bowel sounds are normal.  Musculoskeletal:       Right knee: She exhibits decreased range of motion. Swelling: trace edema.       Left knee: She  exhibits decreased range of motion.  Neurological: She is alert and oriented to person, place, and time. She has normal reflexes.  Skin: Skin is warm and dry.  Psychiatric: She has a normal mood and affect. Her behavior is normal. Judgment and thought content normal.      BP 147/71 mmHg  Pulse 68  Temp(Src) 98.5 F (36.9 C) (Oral)  Resp 16  Ht 4\' 8"  (1.422 m)  Wt 159 lb (72.122 kg)  BMI 35.67 kg/m2  SpO2 98% Assessment & Plan:  1. Essential hypertension Blood pressure at goal on current medication regimen. Will continue Losartan 50 mg daily, no proteinuria present.  - Ambulatory referral to Ophthalmology - COMPLETE METABOLIC PANEL WITH GFR - CBC with Differential  2. Arthritis  - celecoxib (CELEBREX) 100 MG capsule; Take 1 capsule (100 mg total) by mouth 2 (two) times daily.  Dispense: 60 capsule; Refill: 0 - Sedimentation Rate - C-reactive protein  3. Osteoporosis - Vitamin D, 25-hydroxy - alendronate (FOSAMAX) 70 MG tablet; Take 1 tablet (70 mg total) by mouth once a week. Take with a full glass of water on an empty stomach.  Dispense: 12 tablet; Refill: 1  4. Obesity Recommend a lowfat, low carbohydrate diet divided over 5-6 small meals, increase water intake to 6-8 glasses, and 150 minutes per week of cardiovascular exercise.    5. Hyperglycemia - Glucose (CBG) - Hemoglobin A1c  6. Environmental allergies - triamcinolone cream (KENALOG) 0.5 %; Apply 1 application topically 2 (two) times daily. - Olopatadine HCl (PATADAY) 0.2 % SOLN; Apply 1 drop to eye every morning. - cetirizine (ZYRTEC) 10 MG tablet; Take 1 tablet (10 mg total) by mouth daily.  Dispense: 30 tablet; Refill: 11  7. Constipation, unspecified constipation type - polyethylene glycol powder (GLYCOLAX/MIRALAX) powder; Take 1 Container by mouth once.  8. Insomnia - amitriptyline (ELAVIL) 10 MG tablet; Take 10 mg by mouth at bedtime. Reported on 04/10/2016  9. Need for Tdap vaccination - Tdap vaccine  greater than or equal to 7yo IM  10. Immunization due - Pneumococcal conjugate vaccine 13-valent  11. Hypertriglyceridemia - Lipid Panel     Routine Health Maintenance:    Pap smear: 10/24/2015.  [facility] to date with vaccinations   RTC: 1 month for hypertension and arthritis  Massie MaroonHollis,Rapheal Masso M, FNP

## 2016-04-11 ENCOUNTER — Other Ambulatory Visit: Payer: Self-pay | Admitting: Family Medicine

## 2016-04-11 ENCOUNTER — Ambulatory Visit
Admission: RE | Admit: 2016-04-11 | Discharge: 2016-04-11 | Disposition: A | Discharge status: Home / Self Care | Payer: Medicare Other | Source: Ambulatory Visit

## 2016-04-11 DIAGNOSIS — Z1231 Encounter for screening mammogram for malignant neoplasm of breast: Secondary | ICD-10-CM | POA: Diagnosis not present

## 2016-04-11 DIAGNOSIS — E559 Vitamin D deficiency, unspecified: Secondary | ICD-10-CM | POA: Insufficient documentation

## 2016-04-11 DIAGNOSIS — R7303 Prediabetes: Secondary | ICD-10-CM

## 2016-04-11 LAB — VITAMIN D 25 HYDROXY (VIT D DEFICIENCY, FRACTURES): Vit D, 25-Hydroxy: 15 ng/mL — ABNORMAL LOW (ref 30–100)

## 2016-04-11 MED ORDER — ERGOCALCIFEROL 1.25 MG (50000 UT) PO CAPS: 50000.0000 [IU] | ORAL_CAPSULE | ORAL | Status: DC

## 2016-04-12 NOTE — Progress Notes (Signed)
Patient's niece returned with patient. Patient gave verbal permission to speak with niece regarding labs since patient speaks very little english. I advised patient of labs and to take vitamin D supplement once weekly and eat low fat/low carb diet over 5 to 6 small meals daily. Thanks!

## 2016-04-12 NOTE — Progress Notes (Signed)
Called using Interpreter ID # [id number] with [employer] interpre[employer] No answer left message advising patient to return call regarding labs. Thanks!

## 2016-05-09 DIAGNOSIS — H353110 Nonexudative age-related macular degeneration, right eye, stage unspecified: Secondary | ICD-10-CM | POA: Diagnosis not present

## 2016-05-09 DIAGNOSIS — H2512 Age-related nuclear cataract, left eye: Secondary | ICD-10-CM | POA: Diagnosis not present

## 2016-05-09 DIAGNOSIS — H2511 Age-related nuclear cataract, right eye: Secondary | ICD-10-CM | POA: Diagnosis not present

## 2016-05-09 DIAGNOSIS — H25011 Cortical age-related cataract, right eye: Secondary | ICD-10-CM | POA: Diagnosis not present

## 2016-05-09 DIAGNOSIS — H353120 Nonexudative age-related macular degeneration, left eye, stage unspecified: Secondary | ICD-10-CM | POA: Diagnosis not present

## 2016-05-09 DIAGNOSIS — H25012 Cortical age-related cataract, left eye: Secondary | ICD-10-CM | POA: Diagnosis not present

## 2016-05-16 ENCOUNTER — Ambulatory Visit: Payer: Medicare Other | Admitting: Family Medicine

## 2016-05-17 ENCOUNTER — Encounter: Payer: Self-pay | Admitting: Family Medicine

## 2016-05-17 ENCOUNTER — Ambulatory Visit: Payer: Medicare Other | Admitting: Family Medicine

## 2016-05-17 ENCOUNTER — Ambulatory Visit (INDEPENDENT_AMBULATORY_CARE_PROVIDER_SITE_OTHER): Payer: Medicare Other | Admitting: Family Medicine

## 2016-05-17 VITALS — BP 150/78 | HR 68 | Temp 98.0°F | Resp 16 | Ht <= 58 in | Wt 155.0 lb

## 2016-05-17 DIAGNOSIS — I10 Essential (primary) hypertension: Secondary | ICD-10-CM | POA: Diagnosis not present

## 2016-05-17 DIAGNOSIS — Z789 Other specified health status: Secondary | ICD-10-CM

## 2016-05-17 DIAGNOSIS — E785 Hyperlipidemia, unspecified: Secondary | ICD-10-CM | POA: Diagnosis not present

## 2016-05-17 DIAGNOSIS — M1990 Unspecified osteoarthritis, unspecified site: Secondary | ICD-10-CM

## 2016-05-17 LAB — LIPID PANEL
CHOLESTEROL: 136 mg/dL (ref 125–200)
HDL: 62 mg/dL (ref >=46)
LDL Cholesterol: 54 mg/dL (ref <130)
Total CHOL/HDL Ratio: 2.2 Ratio (ref <=5.0)
Triglycerides: 98 mg/dL (ref <150)
VLDL: 20 mg/dL (ref <30)

## 2016-05-17 MED ORDER — LOSARTAN POTASSIUM 50 MG PO TABS
50.0000 mg | ORAL_TABLET | Freq: Every day | ORAL | Status: DC
Start: 2016-05-17 — End: 2016-11-28

## 2016-05-17 MED ORDER — CELECOXIB 100 MG PO CAPS: 100.0000 mg | ORAL_CAPSULE | Freq: Two times a day (BID) | ORAL | Status: DC

## 2016-05-17 NOTE — Patient Instructions (Signed)
Plan de alimentación DASH  (DASH Eating Plan)  DASH es la sigla en inglés de "Enfoques Alimentarios para Detener la Hipertensión". El plan de alimentación DASH ha demostrado bajar la presión arterial elevada (hipertensión). Los beneficios adicionales para la salud pueden incluir la disminución del riesgo de diabetes mellitus tipo 2, enfermedades cardíacas e ictus. Este plan también puede ayudar a adelgazar.  ¿QUÉ DEBO SABER ACERCA DEL PLAN DE ALIMENTACIÓN DASH?  Para el plan de alimentación DASH, seguirá las siguientes pautas generales:  · Elija los alimentos con un valor porcentual diario de sodio de menos del 5 % (según figura en la etiqueta del alimento).  · Use hierbas o aderezos sin sal, en lugar de sal de mesa o sal marina.  · Consulte al médico o farmacéutico antes de usar sustitutos de la sal.  · Coma productos con bajo contenido de sodio, cuya etiqueta suele decir "bajo contenido de sodio" o "sin agregado de sal".  · Coma alimentos frescos.  · Coma más verduras, frutas y productos lácteos con bajo contenido de grasas.  · Elija los cereales integrales. Busque la palabra "integral" en el primer lugar de la lista de ingredientes.  · Elija el pescado y el pollo o el pavo sin piel más a menudo que las carnes rojas. Limite el consumo de pescado, carne de ave y carne a 6 onzas (170 g) por día.  · Limite el consumo de dulces, postres, azúcares y bebidas azucaradas.  · Elija las grasas saludables para el corazón.  · Limite el consumo de queso a 1 onza (28 g) por día.  · Consuma más comida casera y menos de restaurante, de bufé y comida rápida.  · Limite el consumo de alimentos fritos.  · Cocine los alimentos utilizando métodos que no sean la fritura.  · Limite las verduras enlatadas. Si las consume, enjuáguelas bien para disminuir el sodio.  · Cuando coma en un restaurante, pida que preparen su comida con menos sal o, en lo posible, sin nada de sal.  ¿QUÉ ALIMENTOS PUEDO COMER?  Pida ayuda a un nutricionista para  conocer las necesidades calóricas individuales.  Cereales  Pan de salvado o integral. Arroz integral. Pastas de salvado o integrales. Quinua, trigo burgol y cereales integrales. Cereales con bajo contenido de sodio. Tortillas de harina de maíz o de salvado. Pan de maíz integral. Galletas saladas integrales. Galletas con bajo contenido de sodio.  Vegetales  Verduras frescas o congeladas (crudas, al vapor, asadas o grilladas). Jugos de tomate y verduras con contenido bajo o reducido de sodio. Pasta y salsa de tomate con contenido bajo o reducido de sodio. Verduras enlatadas con bajo contenido de sodio o reducido de sodio.   Frutas  Frutas frescas, en conserva (en su jugo natural) o frutas congeladas.  Carnes y otros productos con proteínas  Carne de res molida (al 85 % o más magra), carne de res de animales alimentados con pastos o carne de res sin la grasa. Pollo o pavo sin piel. Carne de pollo o de pavo molida. Cerdo sin la grasa. Todos los pescados y frutos de mar. Huevos. Porotos, guisantes o lentejas secos. Frutos secos y semillas sin sal. Frijoles enlatados sin sal.  Lácteos  Productos lácteos con bajo contenido de grasas, como leche descremada o al 1 %, quesos reducidos en grasas o al 2 %, ricota con bajo contenido de grasas o queso cottage, o yogur natural con bajo contenido de grasas. Quesos con contenido bajo o reducido de sodio.  Grasas y aceites  Margarinas en barra que no contengan grasas trans.   Mayonesa y aliños para ensaladas livianos o reducidos en grasas (reducidos en sodio). [place]. Aceites de cártamo, oliva o canola. Mantequilla natural de maní o almendra.  Otros  Palomitas de maíz y pretzels sin sal.  Los artículos mencionados arriba pueden no ser una lista completa de las bebidas o los alimentos recomendados. Comuníquese con el nutricionista para conocer más opciones.  ¿QUÉ ALIMENTOS NO SE RECOMIENDAN?  Cereales  Pan blanco. Pastas blancas. Arroz blanco. Pan de maíz refinado. Bagels y  croissants. Galletas saladas que contengan grasas trans.  Vegetales  Vegetales con crema o fritos. Verduras en salsa de queso. Verduras enlatadas comunes. Pasta y salsa de tomate en lata comunes. Jugos comunes de tomate y de verduras.  Frutas  Frutas secas. Fruta enlatada en almíbar liviano o espeso. Jugo de frutas.  Carnes y otros productos con proteínas  Cortes de carne con grasa. Costillas, alas de pollo, tocineta, salchicha, mortadela, salame, chinchulines, tocino, perros calientes, salchichas alemanas y embutidos envasados. Frutos secos y semillas con sal. Frijoles con sal en lata.  Lácteos  Leche entera o al 2 %, crema, mezcla de leche y crema, y queso crema. Yogur entero o endulzado. Quesos o queso azul con alto contenido de grasas. Cremas no lácteas y coberturas batidas. Quesos procesados, quesos para untar o cuajadas.  Condimentos  Sal de cebolla y ajo, sal condimentada, sal de mesa y sal marina. Salsas en lata y envasadas. Salsa Worcestershire. Salsa tártara. Salsa barbacoa. Salsa teriyaki. Salsa de soja, incluso la que tiene contenido reducido de sodio. Salsa de carne. Salsa de pescado. Salsa de ostras. Salsa rosada. Rábano picante. Ketchup y mostaza. Saborizantes y tiernizantes para carne. Caldo en cubitos. Salsa picante. Salsa tabasco. Adobos. Aderezos para tacos. Salsas.  Grasas y aceites  Mantequilla, margarina en barra, manteca de cerdo, grasa, mantequilla clarificada y grasa de tocino. Aceites de coco, de palmiste o de palma. Aderezos comunes para ensalada.  Otros  Pickles y aceitunas. Palomitas de maíz y pretzels con sal.  Los artículos mencionados arriba pueden no ser una lista completa de las bebidas y los alimentos que se deben evitar. Comuníquese con el nutricionista para obtener más información.  ¿DÓNDE PUEDO ENCONTRAR MÁS INFORMACIÓN?  Instituto Nacional del Corazón, del Pulmón y de la Sangre (National Heart, Lung, and Blood Institute): www.nhlbi.nih.gov/health/health-topics/topics/dash/      Esta información no tiene como fin reemplazar el consejo del médico. Asegúrese de hacerle al médico cualquier pregunta que tenga.     Document Released: 10/24/2011 Document Revised: 11/25/2014  Elsevier Interactive Patient Education ©2016 Elsevier Inc.

## 2016-05-17 NOTE — Progress Notes (Signed)
Subjective:    Patient ID: Elizabeth Jennings, female    DOB: 12/06/1945, 70 y.o.   MRN: [id number]  HPI  Elizabeth Jennings, a 70 year old female presents for a 1 month follow up of hypertension. Patient primariily speaks spanish, utilizing video interpreter and patient's niece to assist with communication. She has not been taking Losartan 50 mg consistently over the past week. She does not check blood pressures at home.  She is not exercising and is not adherent to low salt diet.  Patient denies chest pain, dyspnea, fatigue, lower extremity edema, palpitations, syncope and tachypnea.  Cardiovascular risk factors include: obesity (BMI >= 30 kg/m2) and sedentary lifestyle.   Patient is also complaining of mild pain primarily involving the right knee. She also complains of joint pains in hands and elbows. Pain has been controlled on Celibrex twice daily. She complains of stiffness in am. Also, pain interferes with sleeping, she has been taking Amitriptyline at night to induce sleep. Pain is worsened when transitioning from sitting to standing. Her current pain intensity is 3/10 described as intermittent and aching.  She denies, fever, fatigue, dysuria, or skin changes.   Past Medical History  Diagnosis Date  . Osteoporosis    Immunization History  Administered Date(s) Administered  . Pneumococcal Conjugate-13 04/10/2016  . Tdap 04/10/2016   Social History   Social History  . Marital Status: Divorced    Spouse Name: N/A  . Number of Children: N/A  . Years of Education: N/A   Occupational History  . Not on file.   Social History Main Topics  . Smoking status: Never Smoker   . Smokeless tobacco: Not on file  . Alcohol Use: No  . Drug Use: No  . Sexual Activity: Not on file   Other Topics Concern  . Not on file   Social History Narrative   Review of Systems  HENT: Negative.   Eyes: Negative.  Negative for photophobia and visual disturbance.  Respiratory: Negative.    Cardiovascular: Negative.  Negative for chest pain, palpitations and leg swelling.  Gastrointestinal: Negative.   Endocrine: Negative.   Genitourinary: Negative.   Musculoskeletal: Positive for arthralgias (Bilateral knee pain) and arthritis.  Skin: Negative.   Neurological: Negative.   Hematological: Negative.   Psychiatric/Behavioral: Negative.  The patient is not nervous/anxious.        Objective:   Physical Exam  Constitutional: She is oriented to person, place, and time. She appears well-developed and well-nourished.  HENT:  Head: Normocephalic and atraumatic.  Right Ear: External ear normal.  Left Ear: External ear normal.  Eyes: Conjunctivae and EOM are normal. Pupils are equal, round, and reactive to light.  Neck: Normal range of motion. Neck supple.  Cardiovascular: Normal rate, regular rhythm, normal heart sounds and intact distal pulses.   Pulmonary/Chest: Effort normal and breath sounds normal.  Abdominal: Soft. Bowel sounds are normal.  Musculoskeletal:       Right knee: She exhibits decreased range of motion.  Neurological: She is alert and oriented to person, place, and time. She has normal reflexes.  Skin: Skin is warm and dry.  Psychiatric: She has a normal mood and affect. Her behavior is normal. Judgment and thought content normal.      BP 150/78 mmHg  Pulse 68  Temp(Src) 98 F (36.7 C) (Oral)  Resp 16  Ht 4\' 8"  (1.422 m)  Wt 155 lb (70.308 kg)  BMI 34.77 kg/m2  SpO2 97% Assessment & Plan:  1. Essential hypertension  Blood pressure is above goal on current medication regimen. She has not taken medication for greater than 1 week.   - losartan (COZAAR) 50 MG tablet; Take 1 tablet (50 mg total) by mouth daily. Reported on 05/17/2016  Dispense: 30 tablet; Refill: 5  2. Arthritis - celecoxib (CELEBREX) 100 MG capsule; Take 1 capsule (100 mg total) by mouth 2 (two) times daily.  Dispense: 60 capsule; Refill: 5  3. Hyperlipidemia The patient is asked to  make an attempt to improve diet and exercise patterns to aid in medical management of this problem. - Lipid Panel  4. Language barrier to communication Used video interpreter to assist with communication.   Routine Health Maintenance:    Pap smear: 10/24/2015.  [facility] Up to date with vaccinations   RTC: 1 month for hypertension and arthritis  Massie MaroonHollis,Alexei Ey M, FNP

## 2016-06-11 ENCOUNTER — Other Ambulatory Visit: Payer: Medicare Other

## 2016-06-11 NOTE — Progress Notes (Signed)
Patient has taken medication today and patient has eaten.

## 2016-06-11 NOTE — Patient Instructions (Signed)
Patient is aware of continuing with current medications and to FU in September.

## 2016-06-14 ENCOUNTER — Ambulatory Visit: Payer: Medicare Other

## 2016-08-02 ENCOUNTER — Ambulatory Visit: Payer: Medicare Other | Admitting: Family Medicine

## 2016-08-23 ENCOUNTER — Encounter: Payer: Self-pay | Admitting: Family Medicine

## 2016-08-23 DIAGNOSIS — H25013 Cortical age-related cataract, bilateral: Secondary | ICD-10-CM | POA: Diagnosis not present

## 2016-08-23 DIAGNOSIS — H2511 Age-related nuclear cataract, right eye: Secondary | ICD-10-CM | POA: Diagnosis not present

## 2016-08-23 DIAGNOSIS — H25011 Cortical age-related cataract, right eye: Secondary | ICD-10-CM | POA: Diagnosis not present

## 2016-08-23 DIAGNOSIS — H25041 Posterior subcapsular polar age-related cataract, right eye: Secondary | ICD-10-CM | POA: Diagnosis not present

## 2016-08-23 DIAGNOSIS — H35033 Hypertensive retinopathy, bilateral: Secondary | ICD-10-CM | POA: Diagnosis not present

## 2016-08-23 DIAGNOSIS — H353130 Nonexudative age-related macular degeneration, bilateral, stage unspecified: Secondary | ICD-10-CM | POA: Diagnosis not present

## 2016-08-23 DIAGNOSIS — H2513 Age-related nuclear cataract, bilateral: Secondary | ICD-10-CM | POA: Diagnosis not present

## 2016-08-23 LAB — HM DIABETES EYE EXAM

## 2016-08-28 ENCOUNTER — Ambulatory Visit: Payer: Medicare Other | Admitting: Family Medicine

## 2016-09-04 DIAGNOSIS — H25011 Cortical age-related cataract, right eye: Secondary | ICD-10-CM | POA: Diagnosis not present

## 2016-09-04 DIAGNOSIS — H25811 Combined forms of age-related cataract, right eye: Secondary | ICD-10-CM | POA: Diagnosis not present

## 2016-09-04 DIAGNOSIS — H2511 Age-related nuclear cataract, right eye: Secondary | ICD-10-CM | POA: Diagnosis not present

## 2016-09-11 DIAGNOSIS — H2512 Age-related nuclear cataract, left eye: Secondary | ICD-10-CM | POA: Diagnosis not present

## 2016-09-11 DIAGNOSIS — H25012 Cortical age-related cataract, left eye: Secondary | ICD-10-CM | POA: Diagnosis not present

## 2016-09-28 ENCOUNTER — Other Ambulatory Visit: Payer: Self-pay | Admitting: Family Medicine

## 2016-09-28 DIAGNOSIS — M810 Age-related osteoporosis without current pathological fracture: Secondary | ICD-10-CM

## 2016-10-02 ENCOUNTER — Other Ambulatory Visit: Payer: Self-pay | Admitting: Family Medicine

## 2016-10-02 ENCOUNTER — Ambulatory Visit: Payer: Medicare Other | Admitting: Family Medicine

## 2016-10-02 DIAGNOSIS — E559 Vitamin D deficiency, unspecified: Secondary | ICD-10-CM

## 2016-10-23 DIAGNOSIS — H2512 Age-related nuclear cataract, left eye: Secondary | ICD-10-CM | POA: Diagnosis not present

## 2016-10-23 DIAGNOSIS — H25812 Combined forms of age-related cataract, left eye: Secondary | ICD-10-CM | POA: Diagnosis not present

## 2016-10-23 DIAGNOSIS — H25012 Cortical age-related cataract, left eye: Secondary | ICD-10-CM | POA: Diagnosis not present

## 2016-11-18 HISTORY — PX: EYE SURGERY: SHX253

## 2016-11-28 ENCOUNTER — Telehealth: Payer: Self-pay

## 2016-11-28 DIAGNOSIS — I10 Essential (primary) hypertension: Secondary | ICD-10-CM

## 2016-11-28 MED ORDER — LOSARTAN POTASSIUM 50 MG PO TABS: 50.0000 mg | ORAL_TABLET | Freq: Every day | ORAL | 0 refills | Status: DC

## 2016-11-28 NOTE — Telephone Encounter (Signed)
Spoke with patient and appointment has been scheduled, refilled Lostartan for 1 month until patient can come in for appointment. Thanks!

## 2016-12-13 ENCOUNTER — Ambulatory Visit (INDEPENDENT_AMBULATORY_CARE_PROVIDER_SITE_OTHER): Payer: Medicare Other | Admitting: Family Medicine

## 2016-12-13 ENCOUNTER — Encounter: Payer: Self-pay | Admitting: Family Medicine

## 2016-12-13 VITALS — BP 146/84 | HR 72 | Temp 98.1°F | Resp 16 | Ht <= 58 in | Wt 161.0 lb

## 2016-12-13 DIAGNOSIS — M1990 Unspecified osteoarthritis, unspecified site: Secondary | ICD-10-CM | POA: Diagnosis not present

## 2016-12-13 DIAGNOSIS — M810 Age-related osteoporosis without current pathological fracture: Secondary | ICD-10-CM

## 2016-12-13 DIAGNOSIS — Z0000 Encounter for general adult medical examination without abnormal findings: Secondary | ICD-10-CM

## 2016-12-13 DIAGNOSIS — Z23 Encounter for immunization: Secondary | ICD-10-CM

## 2016-12-13 DIAGNOSIS — K649 Unspecified hemorrhoids: Secondary | ICD-10-CM

## 2016-12-13 DIAGNOSIS — R7303 Prediabetes: Secondary | ICD-10-CM | POA: Diagnosis not present

## 2016-12-13 DIAGNOSIS — I10 Essential (primary) hypertension: Secondary | ICD-10-CM | POA: Diagnosis not present

## 2016-12-13 DIAGNOSIS — E559 Vitamin D deficiency, unspecified: Secondary | ICD-10-CM

## 2016-12-13 DIAGNOSIS — Z9109 Other allergy status, other than to drugs and biological substances: Secondary | ICD-10-CM | POA: Diagnosis not present

## 2016-12-13 DIAGNOSIS — Z789 Other specified health status: Secondary | ICD-10-CM

## 2016-12-13 LAB — POCT GLYCOSYLATED HEMOGLOBIN (HGB A1C): Hemoglobin A1C: 5.6

## 2016-12-13 MED ORDER — ALENDRONATE SODIUM 70 MG PO TABS: ORAL_TABLET | ORAL | 1 refills | Status: DC

## 2016-12-13 MED ORDER — LOSARTAN POTASSIUM 50 MG PO TABS: 50.0000 mg | ORAL_TABLET | Freq: Every day | ORAL | 0 refills | Status: DC

## 2016-12-13 MED ORDER — CELECOXIB 100 MG PO CAPS: 100.0000 mg | ORAL_CAPSULE | Freq: Two times a day (BID) | ORAL | 5 refills | Status: DC

## 2016-12-13 MED ORDER — ZOSTER VACCINE LIVE 19400 UNT/0.65ML ~~LOC~~ SUSR: 0.6500 mL | Freq: Once | SUBCUTANEOUS | 0 refills | Status: AC

## 2016-12-13 MED ORDER — VITAMIN D (ERGOCALCIFEROL) 1.25 MG (50000 UNIT) PO CAPS: 50000.0000 [IU] | ORAL_CAPSULE | ORAL | 1 refills | Status: DC

## 2016-12-13 MED ORDER — CETIRIZINE HCL 10 MG PO TABS: 10.0000 mg | ORAL_TABLET | Freq: Every day | ORAL | 11 refills | Status: DC

## 2016-12-13 MED ORDER — HYDROCORTISONE 2.5 % RE CREA: 1.0000 "application " | TOPICAL_CREAM | Freq: Two times a day (BID) | RECTAL | 0 refills | Status: DC

## 2016-12-13 NOTE — Progress Notes (Signed)
Subjective:    Patient ID: Elizabeth Jennings, female    DOB: Jan 02, 1946, 71 y.o.   MRN: [id number]  HPI  [name], a 71 year old female presents for  follow up of hypertension. Patient primariily speaks spanish, utilizing patient's niece to assist with communication. She has  been taking Losartan 50 mg consistently over the past week. She does not check blood pressures at home.  She is not exercising and is not adherent to low salt diet.  Patient denies chest pain, dyspnea, fatigue, lower extremity edema, palpitations, syncope and tachypnea.  Cardiovascular risk factors include: obesity (BMI >= 30 kg/m2) and sedentary lifestyle.   Patient is also complaining of mild pain primarily involving the right knee. She also complains of joint pains in hands and elbows. Pain has been controlled on Celibrex twice daily. She complains of stiffness in am. Also, pain interferes with sleeping, she has been taking Amitriptyline at night to induce sleep. Pain is worsened when transitioning from sitting to standing. Her current pain intensity is 3/10 described as intermittent and aching.  She denies, fever, fatigue, dysuria, or skin changes.   Past Medical History:  Diagnosis Date  . Osteoporosis    Immunization History  Administered Date(s) Administered  . Pneumococcal Conjugate-13 04/10/2016  . Tdap 04/10/2016   Social History   Social History  . Marital status: Divorced    Spouse name: N/A  . Number of children: N/A  . Years of education: N/A   Occupational History  . Not on file.   Social History Main Topics  . Smoking status: Never Smoker  . Smokeless tobacco: Never Used  . Alcohol use No  . Drug use: No  . Sexual activity: Not on file   Other Topics Concern  . Not on file   Social History Narrative  . No narrative on file   Review of Systems  HENT: Negative.   Eyes: Negative.  Negative for photophobia and visual disturbance.  Respiratory: Negative.   Cardiovascular:  Negative.  Negative for chest pain, palpitations and leg swelling.  Gastrointestinal: Negative.   Endocrine: Negative.   Genitourinary: Negative.   Musculoskeletal: Positive for arthralgias (Bilateral knee pain).  Skin: Negative.   Neurological: Negative.   Hematological: Negative.   Psychiatric/Behavioral: Negative.  The patient is not nervous/anxious.        Objective:   Physical Exam  Constitutional: She is oriented to person, place, and time. She appears well-developed and well-nourished.  HENT:  Head: Normocephalic and atraumatic.  Right Ear: External ear normal.  Left Ear: External ear normal.  Eyes: Conjunctivae and EOM are normal. Pupils are equal, round, and reactive to light.  Neck: Normal range of motion. Neck supple.  Cardiovascular: Normal rate, regular rhythm, normal heart sounds and intact distal pulses.   Pulmonary/Chest: Effort normal and breath sounds normal.  Abdominal: Soft. Bowel sounds are normal.  Musculoskeletal:       Right knee: She exhibits decreased range of motion.  Neurological: She is alert and oriented to person, place, and time. She has normal reflexes.  Skin: Skin is warm and dry.  Psychiatric: She has a normal mood and affect. Her behavior is normal. Judgment and thought content normal.      BP (!) 146/84 (BP Location: Left Arm, Patient Position: Sitting, Cuff Size: Normal)   Pulse 72   Temp 98.1 F (36.7 C) (Oral)   Resp 16   Ht 4\' 8"  (1.422 m)   Wt 161 lb (73 kg)  SpO2 98%   BMI 36.10 kg/m  Assessment & Plan:  1. Essential hypertension Blood pressure is at goal on current medication regimen - COMPLETE METABOLIC PANEL WITH GFR - Lipid Panel - losartan (COZAAR) 50 MG tablet; Take 1 tablet (50 mg total) by mouth daily. Reported on 05/17/2016  Dispense: 30 tablet; Refill: 0  2. Arthritis  - celecoxib (CELEBREX) 100 MG capsule; Take 1 capsule (100 mg total) by mouth 2 (two) times daily.  Dispense: 60 capsule; Refill: 5  3.  Prediabetes Recommend a lowfat, low carbohydrate diet divided over 5-6 small meals, increase water intake to 6-8 glasses, and 150 minutes per week of cardiovascular exercise.   - HgB A1c  4. Vitamin D deficiency - Vitamin D, 25-hydroxy - Vitamin D, Ergocalciferol, (DRISDOL) 50000 units CAPS capsule; Take 1 capsule (50,000 Units total) by mouth once a week.  Dispense: 12 capsule; Refill: 1  5. Osteoporosis, unspecified osteoporosis type, unspecified pathological fracture presence - alendronate (FOSAMAX) 70 MG tablet; TAKE 1 TABLET BY MOUTH ONCE A WEEK ON AN EMPTY STOMACH WITH A FULL GLASS OF WATER  Dispense: 12 tablet; Refill: 1  6. Environmental allergies - cetirizine (ZYRTEC) 10 MG tablet; Take 1 tablet (10 mg total) by mouth daily.  Dispense: 30 tablet; Refill: 11  7. Hemorrhoids, unspecified hemorrhoid type - hydrocortisone (ANUSOL-HC) 2.5 % rectal cream; Place 1 application rectally 2 (two) times daily.  Dispense: 30 g; Refill: 0  8. Need for prophylactic vaccination and inoculation against influenza - Flu Vaccine QUAD 36+ mos PF IM (Fluarix & Fluzone Quad PF)  9. Immunization due  - Zoster Vaccine Live, PF, (ZOSTAVAX) [id number] UNT/0.65ML injection; Inject 19,400 Units into the skin once.  Dispense: 1 each; Refill: 0  10. Language barrier to communication Used niece to assist with interpreting, she primarily speaks spanish  3211. Healthcare maintenance - Multiple Vitamins-Minerals (ICAPS AREDS 2 PO); Take by mouth.   RTC: 6 months for chronic conditions   The patient was given clear instructions to go to ER or return to medical center if symptoms do not improve, worsen or new problems develop. The patient verbalized understanding. Will notify patient with laboratory results.  Massie MaroonHollis,Darika Ildefonso M, FNP

## 2016-12-13 NOTE — Patient Instructions (Addendum)
Artritis (Arthritis) El significado del trmino artritis es Engineer, mining de las articulaciones. Tambin puede significar enfermedad articular. Una articulacin es Immunologist de unin de [place]. Las personas que sufren artritis pueden tener lo siguiente:  Enrojecimiento de las articulaciones.  Inflamaciones articulares.  Articulaciones rgidas.  Articulaciones calientes.  Grant Ruts.  Sensacin de estar enfermo. CUIDADOS EN EL HOGAR Est atento a cualquier cambio en los sntomas. Tome estas medidas para Engineer, materials y la hinchazn. Medicamentos   [employer] de venta libre y los recetados solamente como se lo haya indicado el mdico.  No tome aspirina para Engineer, materials si el mdico le dice que puede tener gota. Actividades   Ponga la articulacin en reposo si el mdico se lo indica.  Evite las actividades que intensifiquen Chief Technology Officer.  Haga ejercicios con la articulacin como se lo haya indicado el mdico. Intente hacer ejercicios tales como:  Natacin.  Gimnasia acutica.  Andar en bicicleta.  Caminar. Cuidado de la articulacin   Si la articulacin est hinchada, mantngala elevada si el mdico se lo indic.  Si al despertar por la maana, nota que la articulacin est rgida, intente tomar una ducha con agua tibia.  Si es diabtico, no se ponga calor sin consultarle al mdico.  Si se lo indican, pngase calor en la articulacin:  Coloque una toalla entre la articulacin y la compresa caliente o la almohadilla trmica.  Coloque el calor en la zona durante 20 o .  Si se lo indican, pngase hielo en la articulacin:  Ponga el hielo en una bolsa plstica.  Coloque una toalla entre la piel y la bolsa de hielo.  Coloque el hielo durante , 2 a 3veces por Futures trader.  Concurra a todas las visitas de control como se lo haya indicado el mdico. SOLICITE AYUDA SI:  El dolor empeora.  Tiene fiebre. SOLICITE AYUDA DE INMEDIATO SI:  Siente  un dolor muy intenso en la articulacin.  Se le hincha la articulacin.  La articulacin est enrojecida.  Siente dolor en muchas articulaciones y se le hinchan.  Siente un dolor muy intenso en la espalda.  Tiene la pierna muy dbil.  No puede controlar la miccin (orina) o la defecacin (materia fecal). Esta informacin no tiene Theme park manager el consejo del mdico. Asegrese de hacerle al mdico cualquier pregunta que tenga. Document Released: 05/05/2012 Document Revised: 02/26/2016 Document Reviewed: 01/30/2015 Elsevier Interactive Patient Education  2017 Elsevier Inc.  Hipertensin (Hypertension) El trmino hipertensin es otra forma de denominar a la presin arterial elevada. La presin arterial elevada fuerza al corazn a trabajar ms para bombear la sangre. Una lectura de la presin arterial consta de dos nmeros: uno ms alto sobre uno ms bajo (por ejemplo, 110/72). CUIDADOS EN EL HOGAR  Haga que el mdico le tome nuevamente la presin arterial.  Tome los medicamentos solamente como se lo haya indicado el mdico. Siga cuidadosamente las indicaciones. Los medicamentos pierden eficacia si omite dosis. El hecho de omitir las dosis tambin [place] el riesgo de otros problemas.  No fume.  Contrlese la presin arterial en su casa como se lo haya indicado el mdico. SOLICITE AYUDA SI:  Piensa que tiene una reaccin a los medicamentos que est tomando.  Tiene mareos o dolores de cabeza reiterados.  Se le inflaman (hinchan) los tobillos.  Tiene problemas de visin. SOLICITE AYUDA DE INMEDIATO SI:  Tiene un dolor de cabeza muy intenso y est confundido.  Se siente dbil, aturdido o se desmaya.  Tiene  dolor en el pecho o el estmago (abdominal).  Tiene vmitos.  No puede respirar Kimberly-Clarkmuy bien. ASEGRESE DE QUE:  Comprende estas instrucciones.  Controlar su afeccin.  Recibir ayuda de inmediato si no mejora o si empeora. Esta informacin no tiene Microbiologistcomo fin  reemplazar el consejo del mdico. Asegrese de hacerle al mdico cualquier pregunta que tenga. Document Released: 04/24/2010 Document Revised: 11/09/2013 Document Reviewed: 08/27/2013 Elsevier Interactive Patient Education  2017 [employer]

## 2016-12-14 LAB — COMPLETE METABOLIC PANEL WITH GFR
ALBUMIN: 3.8 g/dL (ref 3.6–5.1)
ALK PHOS: 67 U/L (ref 33–130)
ALT: 21 U/L (ref 6–29)
AST: 27 U/L (ref 10–35)
BUN: 21 mg/dL (ref 7–25)
CALCIUM: 9 mg/dL (ref 8.6–10.4)
CO2: 25 mmol/L (ref 20–31)
Chloride: 103 mmol/L (ref 98–110)
Creat: 0.62 mg/dL (ref 0.50–0.99)
GFR, Est African American: >89 mL/min (ref >=60)
Glucose, Bld: 96 mg/dL (ref 65–99)
POTASSIUM: 4.1 mmol/L (ref 3.5–5.3)
SODIUM: 139 mmol/L (ref 135–146)
Total Bilirubin: 0.4 mg/dL (ref 0.2–1.2)
Total Protein: 7.2 g/dL (ref 6.1–8.1)

## 2016-12-14 LAB — LIPID PANEL
CHOL/HDL RATIO: 3 ratio (ref <5.0)
CHOLESTEROL: 127 mg/dL (ref <200)
HDL: 43 mg/dL — AB (ref >50)
LDL Cholesterol: 49 mg/dL (ref <100)
Triglycerides: 176 mg/dL — ABNORMAL HIGH (ref <150)
VLDL: 35 mg/dL — ABNORMAL HIGH (ref <30)

## 2016-12-14 LAB — VITAMIN D 25 HYDROXY (VIT D DEFICIENCY, FRACTURES): Vit D, 25-Hydroxy: 40 ng/mL (ref 30–100)

## 2016-12-17 ENCOUNTER — Telehealth: Payer: Self-pay

## 2016-12-17 NOTE — Telephone Encounter (Signed)
Called patient using interpreter Elizabeth Jennings with [employer] ID [phone], no answer. Message was left for patient to call back when available. Thanks!

## 2016-12-17 NOTE — Telephone Encounter (Signed)
-----   Message from Massie MaroonLachina M Hollis, [place]NP sent at 12/16/2016  4:54 PM EST ----- Regarding: lab results Please inform patient that triglycerides are mildly elevated. Recommend a lowfat, low carbohydrate diet divided over 5-6 small meals, increase water intake to 6-8 glasses, and 150 minutes per week of cardiovascular exercise. Will re-check in 6 months.   Thanks ----- Message ----- From: Loney HeringLaura E Delainee Tramel, LPN Sent: 8/29/56211/26/2018  10:35 AM To: Massie MaroonLachina M Hollis, FNP

## 2016-12-19 DIAGNOSIS — H02835 Dermatochalasis of left lower eyelid: Secondary | ICD-10-CM | POA: Diagnosis not present

## 2016-12-19 DIAGNOSIS — Z961 Presence of intraocular lens: Secondary | ICD-10-CM | POA: Diagnosis not present

## 2016-12-19 DIAGNOSIS — H02423 Myogenic ptosis of bilateral eyelids: Secondary | ICD-10-CM | POA: Diagnosis not present

## 2016-12-19 DIAGNOSIS — H02836 Dermatochalasis of left eye, unspecified eyelid: Secondary | ICD-10-CM | POA: Diagnosis not present

## 2016-12-19 DIAGNOSIS — H02831 Dermatochalasis of right upper eyelid: Secondary | ICD-10-CM | POA: Diagnosis not present

## 2016-12-19 DIAGNOSIS — H02833 Dermatochalasis of right eye, unspecified eyelid: Secondary | ICD-10-CM | POA: Diagnosis not present

## 2016-12-19 NOTE — Telephone Encounter (Signed)
Called using [employer] ID # [id number]. No answer. Message was left for patient to call back to our office. Thanks!

## 2016-12-20 NOTE — Telephone Encounter (Signed)
Have not been able to contact patient by phone will send letter. Thanks!

## 2017-01-26 ENCOUNTER — Other Ambulatory Visit: Payer: Self-pay | Admitting: Family Medicine

## 2017-01-26 DIAGNOSIS — I10 Essential (primary) hypertension: Secondary | ICD-10-CM

## 2017-02-09 ENCOUNTER — Other Ambulatory Visit: Payer: Self-pay | Admitting: Family Medicine

## 2017-02-09 DIAGNOSIS — K649 Unspecified hemorrhoids: Secondary | ICD-10-CM

## 2017-02-24 ENCOUNTER — Other Ambulatory Visit: Payer: Self-pay | Admitting: Family Medicine

## 2017-02-24 DIAGNOSIS — I10 Essential (primary) hypertension: Secondary | ICD-10-CM

## 2017-02-26 DIAGNOSIS — IMO0001 Reserved for inherently not codable concepts without codable children: Secondary | ICD-10-CM | POA: Insufficient documentation

## 2017-02-26 DIAGNOSIS — Z531 Procedure and treatment not carried out because of patient's decision for reasons of belief and group pressure: Secondary | ICD-10-CM | POA: Insufficient documentation

## 2017-03-05 ENCOUNTER — Other Ambulatory Visit: Payer: Self-pay | Admitting: Family Medicine

## 2017-03-05 DIAGNOSIS — E559 Vitamin D deficiency, unspecified: Secondary | ICD-10-CM

## 2017-03-19 ENCOUNTER — Other Ambulatory Visit: Payer: Self-pay | Admitting: Family Medicine

## 2017-03-19 DIAGNOSIS — I10 Essential (primary) hypertension: Secondary | ICD-10-CM

## 2017-03-28 ENCOUNTER — Encounter: Payer: Self-pay | Admitting: Family Medicine

## 2017-04-24 ENCOUNTER — Other Ambulatory Visit: Payer: Self-pay | Admitting: Family Medicine

## 2017-04-24 DIAGNOSIS — Z1231 Encounter for screening mammogram for malignant neoplasm of breast: Secondary | ICD-10-CM

## 2017-04-30 ENCOUNTER — Other Ambulatory Visit: Payer: Self-pay | Admitting: Family Medicine

## 2017-04-30 DIAGNOSIS — I10 Essential (primary) hypertension: Secondary | ICD-10-CM

## 2017-05-02 DIAGNOSIS — M1990 Unspecified osteoarthritis, unspecified site: Secondary | ICD-10-CM | POA: Diagnosis not present

## 2017-05-02 DIAGNOSIS — J309 Allergic rhinitis, unspecified: Secondary | ICD-10-CM | POA: Diagnosis not present

## 2017-05-02 DIAGNOSIS — Z7982 Long term (current) use of aspirin: Secondary | ICD-10-CM | POA: Diagnosis not present

## 2017-05-02 DIAGNOSIS — I10 Essential (primary) hypertension: Secondary | ICD-10-CM | POA: Diagnosis not present

## 2017-05-02 DIAGNOSIS — H02834 Dermatochalasis of left upper eyelid: Secondary | ICD-10-CM | POA: Diagnosis not present

## 2017-05-02 DIAGNOSIS — H0234 Blepharochalasis left upper eyelid: Secondary | ICD-10-CM | POA: Diagnosis not present

## 2017-05-02 DIAGNOSIS — H02831 Dermatochalasis of right upper eyelid: Secondary | ICD-10-CM | POA: Diagnosis not present

## 2017-05-02 DIAGNOSIS — H0231 Blepharochalasis right upper eyelid: Secondary | ICD-10-CM | POA: Diagnosis not present

## 2017-05-02 DIAGNOSIS — M810 Age-related osteoporosis without current pathological fracture: Secondary | ICD-10-CM | POA: Diagnosis not present

## 2017-05-02 DIAGNOSIS — H02423 Myogenic ptosis of bilateral eyelids: Secondary | ICD-10-CM | POA: Diagnosis not present

## 2017-05-09 ENCOUNTER — Ambulatory Visit: Payer: Medicare Other

## 2017-05-20 DIAGNOSIS — Z9889 Other specified postprocedural states: Secondary | ICD-10-CM | POA: Diagnosis not present

## 2017-05-23 ENCOUNTER — Ambulatory Visit
Admission: RE | Admit: 2017-05-23 | Discharge: 2017-05-23 | Disposition: A | Discharge status: Home / Self Care | Payer: Medicare Other | Source: Ambulatory Visit | Attending: Family Medicine | Admitting: Family Medicine

## 2017-05-23 DIAGNOSIS — Z1231 Encounter for screening mammogram for malignant neoplasm of breast: Secondary | ICD-10-CM | POA: Diagnosis not present

## 2017-05-23 DIAGNOSIS — H35033 Hypertensive retinopathy, bilateral: Secondary | ICD-10-CM | POA: Diagnosis not present

## 2017-05-23 DIAGNOSIS — H353132 Nonexudative age-related macular degeneration, bilateral, intermediate dry stage: Secondary | ICD-10-CM | POA: Diagnosis not present

## 2017-05-23 DIAGNOSIS — Z961 Presence of intraocular lens: Secondary | ICD-10-CM | POA: Diagnosis not present

## 2017-05-23 DIAGNOSIS — H02403 Unspecified ptosis of bilateral eyelids: Secondary | ICD-10-CM | POA: Diagnosis not present

## 2017-05-26 ENCOUNTER — Other Ambulatory Visit: Payer: Self-pay | Admitting: Family Medicine

## 2017-05-26 DIAGNOSIS — E559 Vitamin D deficiency, unspecified: Secondary | ICD-10-CM

## 2017-05-26 DIAGNOSIS — M810 Age-related osteoporosis without current pathological fracture: Secondary | ICD-10-CM

## 2017-06-01 ENCOUNTER — Other Ambulatory Visit: Payer: Self-pay | Admitting: Family Medicine

## 2017-06-01 DIAGNOSIS — I10 Essential (primary) hypertension: Secondary | ICD-10-CM

## 2017-06-13 ENCOUNTER — Ambulatory Visit: Payer: Medicare Other | Admitting: Family Medicine

## 2017-07-10 ENCOUNTER — Other Ambulatory Visit: Payer: Self-pay | Admitting: Family Medicine

## 2017-07-10 DIAGNOSIS — I10 Essential (primary) hypertension: Secondary | ICD-10-CM

## 2017-08-10 ENCOUNTER — Other Ambulatory Visit: Payer: Self-pay | Admitting: Family Medicine

## 2017-08-10 DIAGNOSIS — I10 Essential (primary) hypertension: Secondary | ICD-10-CM

## 2017-08-20 ENCOUNTER — Other Ambulatory Visit: Payer: Self-pay | Admitting: Family Medicine

## 2017-08-20 DIAGNOSIS — K649 Unspecified hemorrhoids: Secondary | ICD-10-CM

## 2017-09-03 ENCOUNTER — Other Ambulatory Visit: Payer: Self-pay | Admitting: Family Medicine

## 2017-09-03 DIAGNOSIS — M810 Age-related osteoporosis without current pathological fracture: Secondary | ICD-10-CM

## 2017-09-26 ENCOUNTER — Other Ambulatory Visit: Payer: Self-pay | Admitting: Family Medicine

## 2017-09-26 DIAGNOSIS — I10 Essential (primary) hypertension: Secondary | ICD-10-CM

## 2017-10-10 ENCOUNTER — Other Ambulatory Visit: Payer: Self-pay | Admitting: Family Medicine

## 2017-10-10 DIAGNOSIS — I10 Essential (primary) hypertension: Secondary | ICD-10-CM

## 2017-10-10 DIAGNOSIS — Z9109 Other allergy status, other than to drugs and biological substances: Secondary | ICD-10-CM

## 2017-11-07 ENCOUNTER — Other Ambulatory Visit: Payer: Self-pay | Admitting: Family Medicine

## 2017-11-07 DIAGNOSIS — E559 Vitamin D deficiency, unspecified: Secondary | ICD-10-CM

## 2017-11-19 ENCOUNTER — Other Ambulatory Visit: Payer: Self-pay | Admitting: Family Medicine

## 2017-11-19 DIAGNOSIS — I10 Essential (primary) hypertension: Secondary | ICD-10-CM

## 2017-11-19 DIAGNOSIS — M810 Age-related osteoporosis without current pathological fracture: Secondary | ICD-10-CM

## 2017-12-10 ENCOUNTER — Ambulatory Visit: Payer: Medicare Other | Admitting: Family Medicine

## 2017-12-11 ENCOUNTER — Ambulatory Visit (INDEPENDENT_AMBULATORY_CARE_PROVIDER_SITE_OTHER): Payer: Medicare Other | Admitting: Family Medicine

## 2017-12-11 ENCOUNTER — Encounter: Payer: Self-pay | Admitting: Family Medicine

## 2017-12-11 VITALS — BP 156/84 | HR 65 | Temp 98.3°F | Resp 16 | Ht <= 58 in | Wt 168.0 lb

## 2017-12-11 DIAGNOSIS — N898 Other specified noninflammatory disorders of vagina: Secondary | ICD-10-CM | POA: Diagnosis not present

## 2017-12-11 DIAGNOSIS — K649 Unspecified hemorrhoids: Secondary | ICD-10-CM

## 2017-12-11 DIAGNOSIS — I10 Essential (primary) hypertension: Secondary | ICD-10-CM

## 2017-12-11 DIAGNOSIS — M1990 Unspecified osteoarthritis, unspecified site: Secondary | ICD-10-CM

## 2017-12-11 DIAGNOSIS — M810 Age-related osteoporosis without current pathological fracture: Secondary | ICD-10-CM

## 2017-12-11 DIAGNOSIS — E559 Vitamin D deficiency, unspecified: Secondary | ICD-10-CM | POA: Diagnosis not present

## 2017-12-11 DIAGNOSIS — R7303 Prediabetes: Secondary | ICD-10-CM

## 2017-12-11 DIAGNOSIS — L219 Seborrheic dermatitis, unspecified: Secondary | ICD-10-CM

## 2017-12-11 DIAGNOSIS — Z789 Other specified health status: Secondary | ICD-10-CM

## 2017-12-11 DIAGNOSIS — Z23 Encounter for immunization: Secondary | ICD-10-CM | POA: Diagnosis not present

## 2017-12-11 LAB — POCT URINALYSIS DIP (DEVICE)
BILIRUBIN URINE: NEGATIVE
Glucose, UA: NEGATIVE mg/dL
Ketones, ur: NEGATIVE mg/dL
LEUKOCYTES UA: NEGATIVE
Nitrite: NEGATIVE
PH: 7 (ref 5.0–8.0)
Protein, ur: NEGATIVE mg/dL
Specific Gravity, Urine: 1.015 (ref 1.005–1.030)
Urobilinogen, UA: 0.2 mg/dL (ref 0.0–1.0)

## 2017-12-11 LAB — POCT GLYCOSYLATED HEMOGLOBIN (HGB A1C): Hemoglobin A1C: 5.5

## 2017-12-11 MED ORDER — HYDROCORTISONE 2.5 % RE CREA: 1.0000 "application " | TOPICAL_CREAM | Freq: Two times a day (BID) | RECTAL | 0 refills | Status: DC

## 2017-12-11 MED ORDER — ALENDRONATE SODIUM 70 MG PO TABS: ORAL_TABLET | ORAL | 1 refills | Status: DC

## 2017-12-11 MED ORDER — LOSARTAN POTASSIUM 50 MG PO TABS: 50.0000 mg | ORAL_TABLET | Freq: Every day | ORAL | 1 refills | Status: DC

## 2017-12-11 MED ORDER — SELENIUM SULFIDE 2.25 % EX SHAM: 1.0000 "application " | MEDICATED_SHAMPOO | CUTANEOUS | 1 refills | Status: DC

## 2017-12-11 NOTE — Patient Instructions (Addendum)
Will follow up by phone with any abnormal laboratory results.  Blood pressure is above goal, make sure that you are taking medication consistently.  We have discussed target BP range and blood pressure goal. I have advised patient to check BP regularly and to call us back or report to clinic if the numbers are consistently higher than 140/90. We discussed the importance of compliance with medical therapy and DASH diet recommended, consequences of uncontrolled hypertension discussed.    For scalp itching. Recommend washing hair with Selenium shampoo weekly. Use regular shampoo on other days.   For anal itching, apply Anusol cream as directed.    Prediabetes (Prediabetes) QU ES LA PREDIABETES? La prediabetes es la enfermedad que presenta un nivel de azcar en la sangre (glucemia) ms alto de lo normal, pero no lo suficientemente alto como para que le diagnostiquen diabetes tipo2. El hecho de ser prediabtico lo pone en riesgo de desarrollar diabetes tipo2 (diabetes mellitus tipo2). La prediabetes tambin se puede llamar intolerancia a la glucosa o glucosa alterada en ayunas. Generalmente, la prediabetes no causa sntomas. El mdico puede diagnosticar esta enfermedad por los anlisis de [place]. Los anlisis para Engineer, manufacturing la prediabetes se pueden realizar si usted tiene sobrepeso y si presenta al menos un factor de riesgo ms de prediabetes. Entre los factores de riesgo de prediabetes, se incluyen los siguientes:  Warehouse manager un familiar con diabetes tipo2.  Sobrepeso u obesidad.  Tener ms de 45 aos.  Ser descendiente de indgenas norteamericanos, afroamericanos, hispanos o latinos, o asiticos o isleos del Pacfico.  Tener un estilo de vida inactivo (sedentario).  Tener antecedentes de diabetes gestacional o sndrome de ovario poliqustico (SOP).  Tener niveles bajos del colesterol bueno (HDL-C) o niveles altos de grasas en la sangre (triglicridos).  Tener hipertensin arterial. QU  ES LA GLUCEMIA Y CMO SE MIDE? La glucemia hace referencia a la cantidad de glucosa que tiene en el torrente sanguneo. La glucosa proviene de los alimentos que contienen azcar y almidn (carbohidratos) que el organismo descompone para formar glucosa. El nivel de glucemia se puede medir en mg/dl (miligramos por decilitro) o mmol/l (milimoles por litro).La glucemia puede controlarse con uno o ms de los siguientes anlisis de sangre:  Medicin de la glucemia en [place]. No se le permitir comer (tendr que [employer]) durante al menos 8horas antes de que se tome una [place]. ? Un rango normal de glucemia en ayunas es de 70 a 100mg /dl (de 3,9 a 4,0JWJX/B).  Un anlisis de sangre de A1c (hemoglobina A1c). Este anlisis proporciona informacin sobre el control de la glucemia durante los ltimos 2 o .  Prueba de tolerancia a la glucosa oral (PTGO). Esta prueba mide la glucemia dos veces: ? Despus del ayuno. Este es el valor inicial. ? Dos horas despus de ingerir una bebida que contiene glucosa. Pueden diagnosticarle prediabetes en los siguientes casos:  Si la glucemia en ayunas es de 100 a 125mg /dl (de 5,6 a 1,4NWGN/F).  Si el nivel de A1c es del 5,7% al 6,4%.  Si el resultado de la PTGO es de 140 a 199mg /dl (de 7,8 a [id number]/M). Estos anlisis de sangre se pueden repetir para Pharmacist, hospital diagnstico. QU SUCEDE SI LA GLUCEMIA ES DEMASIADO ALTA? El pncreas produce una hormona (insulina) que ayuda a Merchant navy officer glucosa desde el torrente sanguneo hacia las clulas. Cuando las clulas no responden de forma [place] a la insulina que el organismo produce (resistencia a la insulina), el exceso de glucosa se acumula en la  sangre en vez de dirigirse hacia las clulas. Como consecuencia, se puede desarrollar glucemia alta (hiperglucemia), que puede causar muchas complicaciones. Este es uno de los sntomas de la prediabetes. QU PUEDE SUCEDER SI LA GLUCEMIA PERMANECE MS ALTA DE  LO NORMAL DURANTE MUCHO TIEMPO? Es peligroso Public librarian glucemia alta durante mucho tiempo. Demasiada glucosa en la sangre puede daar los nervios y los vasos sanguneos. El dao a largo plazo puede provocar complicaciones de la diabetes, por ejemplo:  Cardiopata.  Ictus.  Ceguera.  Enfermedad renal.  Depresin.  Mala circulacin en los pies y en las piernas, que podra llevar a la extraccin quirrgica (amputacin) en casos graves. CMO SE PUEDE EVITAR QUE LA PREDIABETES SE CONVIERTA EN DIABETES TIPO2? Para prevenir la diabetes tipo2, tome las siguientes medidas:  Haga actividad fsica. ? Haga actividad fsica de intensidad moderada durante al menos como mnimo 5das por [employer], o tanto como le haya indicado el mdico. Podra hacer caminatas dinmicas, ciclismo o [place]. ? Pregntele al mdico qu actividades son seguras para usted. Una combinacin de actividades puede ser la mejor opcin, por ejemplo, caminar, practicar natacin, andar en bicicleta y hacer entrenamiento de fuerza.  Baje de [employer] Electric se lo haya indicado el mdico. ? Bajar entre el 5% y el 7% del peso corporal puede revertir la resistencia a la insulina. ? El mdico puede determinar cuntos kilos tiene que bajar y [place] a que adelgace de Wellsite geologist segura.  Siga un plan de alimentacin saludable. Este incluye consumir protenas magras, hidratos de carbono complejos, frutas y verduras frescas, productos lcteos con bajo contenido de grasa y grasas saludables. ? Siga las indicaciones del mdico respecto de las restricciones para las comidas o las bebidas. ? Programe una cita con un especialista en alimentacin y nutricin (nutricionista certificado) para que lo ayude a Quarry manager plan de alimentacin saludable adecuado para usted.  No fume ni consuma ningn producto que contenga tabaco, lo que incluye cigarrillos, tabaco de Theatre manager y Administrator, Civil Service. Si necesita ayuda para dejar de fumar,  consulte al [employer] Express.  [employer] International de venta libre y los recetados como se lo haya indicado el mdico. Es posible que le receten medicamentos que ayuden a disminuir el riesgo de tener diabetes tipo2. Esta informacin no tiene Theme park manager el consejo del mdico. Asegrese de hacerle al mdico cualquier pregunta que tenga. Document Released: 12/26/2015 Document Revised: 12/26/2015 Document Reviewed: 12/26/2015 Elsevier Interactive Patient Education  2018 [employer].  Hemorroides (Hemorrhoids) Las hemorroides son venas inflamadas adentro o alrededor del recto o del ano. Las hemorroides pueden causar dolor, picazn o hemorragias. Generalmente no causan problemas graves. Con frecuencia mejoran al [employer] Materials dieta, el estilo de vida y otros tratamientos Facilities manager. CUIDADOS EN EL HOGAR Comida y bebida  Consuma alimentos que contengan fibra, como cereales integrales, porotos, frutos secos, frutas y verduras. Pregntele a su mdico acerca de tomar productos con fibra aadida en ellos (complementos defibra).  Beba suficiente lquido para mantener el pis (orina) claro o de color amarillo plido. En caso de dolor e hinchazn  Tome un bao de agua tibia (bao de asiento) durante 20 minutos para Engineer, materials. Hgalo 3 o 4veces al da.  Si se lo indican, aplique hielo sobre la zona adolorida. Puede ser beneficioso aplicar hielo [employer] baos con agua tibia. ? Ponga el hielo en una bolsa plstica. ? Coloque una FirstEnergy Corp piel y la bolsa de hielo. ? Coloque el hielo durante 20 minutos, 2  a 3 veces por da. Instrucciones generales  Bax[employer] International de venta libre y los recetados solamente como se lo haya indicado el mdico. ? Las cremas y medicamentos recetados que se colocan en el ano (supositorios) se pueden usar o Sales executive se lo hayan indicado.  Haga ejercicio fsico con frecuencia.  Vaya al bao cuando sienta la necesidad de defecar. No espere.  Evite  hacer demasiada fuerza al defecar.  Mantenga la zona del ano limpia y [place]. Use papel higinico hmedo o toallas de papel hmedas.  No pase mucho tiempo sentado en el inodoro. SOLICITE AYUDA SI:  Tiene alguno de estos sntomas: ? Dolor e hinchazn que no mejoran con el tratamiento o los medicamentos. ? Hemorragia que no se detiene. ? Dificultad para defecar o imposibilidad de hacerlo. ? Dolor o hinchazn en la zona exterior de las hemorroides. Esta informacin no tiene Theme park manager el consejo del mdico. Asegrese de hacerle al mdico cualquier pregunta que tenga. Document Released: 03/01/2013 Document Revised: 02/26/2016 Document Reviewed: 07/19/2015 Elsevier Interactive Patient Education  2018 [employer].  Prevenir el aumento de peso no saludable, en adultos Preventing Unhealthy [employer] Energy, Adult Mantener un peso saludable es importante. Cuando la grasa se acumula en el cuerpo, usted puede convertirse en obeso o tener sobrepeso. Estos problemas hacen que tenga un mayor riesgo de desarrollar ciertos problemas de salud, como enfermedad cardaca, diabetes, problemas para dormir, problemas articulares y ciertos tipos de cnceres. El aumento de peso no saludable suele ser es el resultado de tomar decisiones poco saludables en lo que respecta a lo que come. Tambin es el resultado de no hacer suficiente ejercicio fsico. Puede hacer cambios en su estilo de vida para prevenir la obesidad y mantenerse tan saludable como sea posible. Qu cambios nutricionales se pueden realizar? Para mantener un peso saludable y prevenir la obesidad:  Coma solo la cantidad que el cuerpo necesita. Haga lo siguiente: ? Preste atencin a los signos de que tiene hambre o que se siente satisfecho. Deje de comer tan pronto como se sienta satisfecho. ? Si tiene hambre, primero beba agua. Beba suficiente agua para que la orina sea clara o de color amarillo plido. ? Coma porciones ms pequeas. ? Consulte los  tamaos de las porciones en las etiquetas de los alimentos. La mayora de los alimentos contiene ms de una porcin por envase. ? Consuma la cantidad recomendada de caloras segn su sexo y [place]. Si bien la Harley[employer]Davidson de las personas activas deben consumir alrededor de 2000 caloras por da, si ests tratando de perder peso o si no es muy [place], debe consumir menos caloras. Hable con el mdico o el nutricionista sobre cuntas caloras debe consumir por Futures trader.  Elija alimentos saludables, por ejemplo: ? Frutas y verduras. Trate de que al Coca-Cola mitad del plato de cada comida sea de frutas y verduras. ? Cereales integrales, como el pan integral, el arroz integral y Sport and exercise psychologist. ? Carnes magras, como la carne de ave o pescado. ? Otras protenas saludables, como los frijoles, los huevos o el tofu. ? Grasas saludables, como las nueces, las semillas, los pescados grasos y el aceite de [place]. ? Productos lcteos con bajo contenido de [place] o sin grasa.  Revise las etiquetas de los alimentos y evite los alimentos y bebidas que: ? Contienen muchas caloras. ? Tienen agregados de International aid/development worker. ? Contienen [employer] Group. ? Tienen grasas saturadas o grasas trans.  Limite la cantidad que consume de los siguientes alimentos: ?  Comidas envasadas. ? Comida rpida. ? Comidas fritas. ? Carnes procesadas, como tocino, salchicha y fiambres. ? Cortes grasos de carne de res y carne de ave con piel.  Cocine los alimentos en formas ms saludables, tales como hornear, Technical sales engineeremparrillar o asar a la parrilla.  Cuando vaya de compras, intente mantenerse fuera de los pasillos de la tienda. Esto lo ayudar a comprar una mayor cantidad de alimentos frescos y Automotive engineerevitar los alimentos enlatados y preenvasados.  Qu cambios en el estilo de vida se pueden realizar?  Haga al menos 30minutos de ejercicio 5o ms Masco Corporationdas cada semana. Hacer ejercicio incluye caminar rpido, trabajar en el jardn, andar en bicicleta, correr, nadar y  practicar deportes en equipo como baloncesto y ftbol. Consulte al mdico qu ejercicios son seguros para usted.  No consuma ningn producto que contenga nicotina o tabaco, como cigarrillos y Administrator, Civil Servicecigarrillos electrnicos. Si necesita ayuda para dejar de fumar, consulte al mdico.  Limite el consumo de alcohol a no ms de 1medida por da si es mujer y no est [place]iembarazada, y 2medidas por da si es hombre. Una medida equivale a 12oz (355ml) de cerveza, 5oz (148ml) de vino o 1oz (44ml) de bebidas alcohlicas de alta graduacin.  Trate de dormir entre 7 y 9horas todas las noches. Qu otros cambios se pueden realizar?  Lleve un registro de los ConocoPhillipsalimentos que come y las actividades que realiza para Chief Operating Officercontrolar: ? Lo que comi y cuntas caloras tena. Recuerde Fortune Brandscontar las salsas, los aderezos y las guarniciones. ? Si estuvo activo y qu ejercicios hizo. ? Sus objetivos de Grenadacaloras, Morgandalepeso y Powayactividad.  Controle su peso regularmente. Haga un seguimiento de los Sibleycambios. Si observa que ha subido de [place]peso, haga cambios en su dieta o Somaliarutina de actividad.  Evite tomar medicamentos o suplementos para perder peso. Hable con el mdico antes de empezar cualquier medicamento o suplemento nuevo.  Hable con el mdico antes de probar una dieta nueva o un plan nuevo de actividad fsica. Por qu son importantes estos cambios? Comer sano, mantenerse activo y tener hbitos saludables no solo ayuda a prevenir la obesidad, sino tambin:  Le resultar ms sencillo controlar el estrs y las emociones.  Le resultar ms sencillo sentirse conectado con amigos y familiares.  Mejorar su autoestima.  Mejorar el sueo.  Prevendr problemas de salud a Air cabin crewlargo plazo.  Qu puede suceder si no se hacen cambios? Ser obeso o tener sobrepeso puede hacer que desarrolle problemas en las articulaciones o en los Johnsonburghuesos, lo que puede dificultarle mantenerse activo o Education officer, environmentalrealizar actividades que disfruta. Ser obeso o tener sobrepeso  hace que el corazn y los pulmones se esfuercen ms para Printmakertrabajar, y Leggett & Plattpuede provocar problemas de salud, como diabetes, enfermedad cardaca y ciertos tipos de cnceres. Dnde encontrar ms informacin: Hable con su mdico o un dietista sobre opciones de alimentacin saludable y estilo de vida saludable. Tambin puede encontrar ms informacin a travs de estos recursos:  MyPlate del Departamento de Estate manager/land agentAgricultura de los Estados FabulousVibe.finidos:www.choosemyplate.gov  Asociacin Estadounidense del Corazn (American Heart Association): www.heart.org  Centers for Disease Control and Prevention (Centros para el control y la prevencin de enfermedades, CDC): FootballExhibition.com.brwww.cdc.gov  Resumen  Mantener un peso saludable es importante. Ayuda a prevenir ciertas enfermedades y problemas de salud, como enfermedad cardaca, diabetes, trastornos del sueo, problemas articulares y ciertos tipos de cnceres.  Ser obeso o tener sobrepeso puede hacer que desarrolle problemas en las articulaciones o en los [place]huesos, lo que puede dificultarle mantenerse activo o Education officer, environmentalrealizar actividades que disfruta.  Puede prevenir el aumento de peso no saludable al llevar una dieta saludable, hacer ejercicio regularmente, no fumar, limitar el consumo de alcohol y dormir lo suficiente.  Hable con su mdico o un dietista para obtener asesoramiento sobre opciones de alimentacin saludable y estilo de vida saludable. Esta informacin no tiene Theme park manager el consejo del mdico. Asegrese de hacerle al mdico cualquier pregunta que tenga. Document Released: 03/14/2017 Document Revised: 03/14/2017 Document Reviewed: 03/14/2017 Elsevier Interactive Patient Education  [employer].

## 2017-12-11 NOTE — Progress Notes (Signed)
hgba °

## 2017-12-12 ENCOUNTER — Telehealth: Payer: Self-pay

## 2017-12-12 LAB — CMP AND LIVER
ALBUMIN: 4.4 g/dL (ref 3.5–4.8)
ALT: 25 IU/L (ref 0–32)
AST: 30 IU/L (ref 0–40)
Alkaline Phosphatase: 82 IU/L (ref 39–117)
BILIRUBIN TOTAL: 0.2 mg/dL (ref 0.0–1.2)
BILIRUBIN, DIRECT: 0.1 mg/dL (ref 0.00–0.40)
BUN: 16 mg/dL (ref 8–27)
CALCIUM: 9.5 mg/dL (ref 8.7–10.3)
CO2: 23 mmol/L (ref 20–29)
Chloride: 104 mmol/L (ref 96–106)
Creatinine, Ser: 0.71 mg/dL (ref 0.57–1.00)
GFR, EST AFRICAN AMERICAN: 100 mL/min/{1.73_m2} (ref >59)
GFR, EST NON AFRICAN AMERICAN: 87 mL/min/{1.73_m2} (ref >59)
Glucose: 93 mg/dL (ref 65–99)
POTASSIUM: 4.2 mmol/L (ref 3.5–5.2)
Sodium: 142 mmol/L (ref 134–144)
Total Protein: 7.5 g/dL (ref 6.0–8.5)

## 2017-12-12 LAB — LIPID PANEL
CHOL/HDL RATIO: 2.9 ratio (ref 0.0–4.4)
Cholesterol, Total: 137 mg/dL (ref 100–199)
HDL: 48 mg/dL (ref >39)
LDL Calculated: 49 mg/dL (ref 0–99)
Triglycerides: 199 mg/dL — ABNORMAL HIGH (ref 0–149)
VLDL Cholesterol Cal: 40 mg/dL (ref 5–40)

## 2017-12-12 LAB — VITAMIN D 25 HYDROXY (VIT D DEFICIENCY, FRACTURES): Vit D, 25-Hydroxy: 31.9 ng/mL (ref 30.0–100.0)

## 2017-12-12 NOTE — Telephone Encounter (Signed)
-----   Message from Massie MaroonLachina M Hollis, OregonFNP sent at 12/12/2017  9:35 AM EST ----- Regarding: lab results Please inform patient that triglycerides continue to be elevated. Recommend low fat, low carbohydrate diet as discussed during yesterday's appointment. Please follow up in clinic as scheduled.   Thanks

## 2017-12-12 NOTE — Telephone Encounter (Signed)
Called using McKessonLanguage Resources Line ID [id number]. No answer, a message was left for patient to contact our office regarding lab results and a phone number was left. Thanks!

## 2017-12-13 ENCOUNTER — Encounter: Payer: Self-pay | Admitting: Family Medicine

## 2017-12-13 LAB — VAGINITIS/VAGINOSIS, DNA PROBE
Candida Species: NEGATIVE
GARDNERELLA VAGINALIS: NEGATIVE
Trichomonas vaginosis: NEGATIVE

## 2017-12-13 NOTE — Progress Notes (Signed)
Subjective:    Patient ID: Elizabeth Jennings, female    DOB: 12-01-1945, 72 y.o.   MRN: [id number]  HPI Elizabeth Jennings, a 72 year old female with a history of hypertension, osteoporosis, prediabetes, obesity, and vitamin D deficiency presents for a follow up of chronic conditions. Patient primarily speaks spanish, will use interpreter to assist with communication. Patient in accompanied by friend. Elizabeth Jennings has been lost to follow up over the past year. She was taking all medications consistently prior to 1 week ago. She has been out of medications. She does not follow a low fat, low sodium diet or exercise. Also, Elizabeth Jennings does not check blood pressures at home. Patient denies headache, dizziness, chest pain, shortness of breath, or bilateral lower extremity edema. Cardiac risk factors include sedentary lifestyle, obesity, dyslipidemia, and hypertension.  Elizabeth Jennings also has a history of hemorrhoids. She has been using hydrocortisone to external anal area consistently. She is complaining of periodic vaginal and anal itching. Patient denies maroon colored stools, melena and weight loss.  Patient is also complaining of increased itching and flaking to scalp. She says that problem has increased over the past several weeks. She says that she has been using different brands of shampoo to alleviate the problem without success. She describes scalp as oily at times.  Past Medical History:  Diagnosis Date  . Hemorrhoids   . Hyperlipidemia   . Hypertension   . Osteoporosis   . Prediabetes    Social History   Socioeconomic History  . Marital status: Divorced    Spouse name: Not on file  . Number of children: Not on file  . Years of education: Not on file  . Highest education level: Not on file  Social Needs  . Financial resource strain: Not on file  . Food insecurity - worry: Not on file  . Food insecurity - inability: Not on file  . Transportation needs - medical: Not on file  .  Transportation needs - non-medical: Not on file  Occupational History  . Not on file  Tobacco Use  . Smoking status: Never Smoker  . Smokeless tobacco: Never Used  Substance and Sexual Activity  . Alcohol use: No  . Drug use: No  . Sexual activity: Not on file  Other Topics Concern  . Not on file  Social History Narrative  . Not on file   Immunization History  Administered Date(s) Administered  . Influenza,inj,Quad PF,6+ Mos 12/13/2016, 12/11/2017  . Pneumococcal Conjugate-13 04/10/2016, 12/11/2017  . Tdap 04/10/2016     Current Outpatient Medications:  .  acetaminophen (TYLENOL) 500 MG tablet, Take 500 mg by mouth every 6 (six) hours as needed., Disp: , Rfl:  .  alendronate (FOSAMAX) 70 MG tablet, TAKE 1 TABLET BY MOUTH 1 TIME A WEEK ON AN EMPTY STOMACH AND WITH A FULL GLASS OF WATER, Disp: 12 tablet, Rfl: 1 .  aspirin 81 MG tablet, Take 81 mg by mouth daily., Disp: , Rfl:  .  celecoxib (CELEBREX) 100 MG capsule, Take 1 capsule (100 mg total) by mouth 2 (two) times daily., Disp: 60 capsule, Rfl: 5 .  cetirizine (ZYRTEC) 10 MG tablet, Take 1 tablet (10 mg total) by mouth daily., Disp: 30 tablet, Rfl: 11 .  losartan (COZAAR) 50 MG tablet, Take 1 tablet (50 mg total) by mouth daily., Disp: 90 tablet, Rfl: 1 .  Multiple Vitamins-Minerals (ICAPS AREDS 2 PO), Take by mouth., Disp: , Rfl:  .  PROCTOZONE-HC 2.5 % rectal cream, USE  1 APPLICATORFUL RECTALLY TWICE DAILY, Disp: 30 g, Rfl: 0 .  Vitamin D, Ergocalciferol, (DRISDOL) 50000 units CAPS capsule, TAKE 1 CAPSULE BY MOUTH 1 TIME A WEEK, Disp: 12 capsule, Rfl: 0 .  amitriptyline (ELAVIL) 10 MG tablet, Take 10 mg by mouth at bedtime. Reported on 04/10/2016, Disp: , Rfl:  .  cetirizine (ZYRTEC) 10 MG tablet, TAKE 1 TABLET BY MOUTH DAILY, Disp: 30 tablet, Rfl: 0 .  hydrocortisone (ANUSOL-HC) 2.5 % rectal cream, Place 1 application rectally 2 (two) times daily., Disp: 30 g, Rfl: 0 .  Selenium Sulfide 2.25 % SHAM, Apply 1 application  topically once a week., Disp: 1 Bottle, Rfl: 1 Review of Systems  Constitutional: Positive for unexpected weight change (weight gain). Negative for fatigue.  HENT: Negative.   Eyes: Negative.   Respiratory: Negative for shortness of breath.   Cardiovascular: Negative.   Gastrointestinal: Negative.   Endocrine: Negative for polydipsia, polyphagia and polyuria.  Genitourinary:       Anal and vaginal itching  Musculoskeletal: Negative.   Skin: Negative.   Allergic/Immunologic: Negative for immunocompromised state.  Neurological: Negative.   Hematological: Negative.   Psychiatric/Behavioral: Negative.        Objective:   Physical Exam  Constitutional: She appears well-developed and well-nourished.  HENT:  Head: Normocephalic and atraumatic.  Right Ear: External ear normal.  Left Ear: External ear normal.  Mouth/Throat: Oropharynx is clear and moist.  Eyes: Pupils are equal, round, and reactive to light.  Neck: Normal range of motion. Neck supple.  Cardiovascular: Normal rate and regular rhythm.  Pulmonary/Chest: Effort normal.  Abdominal: Soft. Bowel sounds are normal.  Increased abdominal girth  Genitourinary: Rectal exam shows external hemorrhoid. Rectal exam shows guaiac negative stool.  Psychiatric: She has a normal mood and affect. Her speech is normal and behavior is normal. Judgment and thought content normal. Cognition and memory are normal.    BP (!) 156/84 (BP Location: Left Arm, Patient Position: Sitting, Cuff Size: Normal) Comment: manually  Pulse 65   Temp 98.3 F (36.8 C) (Oral)   Resp 16   Ht 4\' 8"  (1.422 m)   Wt 168 lb (76.2 kg)   SpO2 98%   BMI 37.66 kg/m     Assessment & Plan:  1. Essential hypertension Blood pressure is above goal. Patient has been without medications for 1 week. Will restart Losartan 50 mg daily.  Discussed a lowfat, low sodium diet at length.  We have discussed target BP range and blood pressure goal. I have advised patient to  check BP regularly and to contact clinic if blood pressure is  consistently higher than 140/90. We discussed the importance of compliance with medical therapy and DASH diet recommended, consequences of uncontrolled hypertension discussed.  - continue current BP medications  - POCT urinalysis dip (device) - losartan (COZAAR) 50 MG tablet; Take 1 tablet (50 mg total) by mouth daily.  Dispense: 90 tablet; Refill: 1 - CMP and Liver - Lipid Panel  2. Prediabetes Discussed diet modifications and  increase water intake to 6-8 glasses, and 150 minutes per week of cardiovascular exercise.   - HgB A1c - POCT urinalysis dip (device)  3. Osteoporosis, unspecified osteoporosis type, unspecified pathological fracture presence - alendronate (FOSAMAX) 70 MG tablet; TAKE 1 TABLET BY MOUTH 1 TIME A WEEK ON AN EMPTY STOMACH AND WITH A FULL GLASS OF WATER  Dispense: 12 tablet; Refill: 1  4. Language barrier to communication Patient primarily speaks spanish, used interpreter to assist with communication  5. Vitamin D deficiency - Vitamin D, 25-hydroxy  6. Seborrheic dermatitis of scalp - Selenium Sulfide 2.25 % SHAM; Apply 1 application topically once a week.  Dispense: 1 Bottle; Refill: 1  7. Arthritis - acetaminophen (TYLENOL) 500 MG tablet; Take 500 mg by mouth every 6 (six) hours as needed.  8. Immunization due - Pneumococcal conjugate vaccine 13-valent  9. Influenza vaccination given - Flu Vaccine QUAD 36+ mos IM (Fluarix & Fluzone Quad PF  10. Vaginal itching - Vaginitis/Vaginosis, DNA Probe  11. Hemorrhoids, unspecified hemorrhoid type - hydrocortisone (ANUSOL-HC) 2.5 % rectal cream; Place 1 application rectally 2 (two) times daily.  Dispense: 30 g; Refill: 0   Greater than 50% of visit spent with interpreter discussing diet options and medication regimen   RTC: 3 months for hypertension  Nolon NationsLachina Moore Waver Dibiasio  MSN, FNP-C Patient Care [facility] [address]  [place], [place] [id number] [phone]

## 2017-12-16 NOTE — Telephone Encounter (Signed)
Called using [employer] ID [phone]. Advised that triglycerides continue to be elevated and asked that patient start low fat/ low carb diet as discussed during appointment. Patient verbalized understanding and will keep next scheduled appointment. Thanks!

## 2017-12-17 ENCOUNTER — Other Ambulatory Visit: Payer: Self-pay | Admitting: Family Medicine

## 2017-12-17 DIAGNOSIS — Z9109 Other allergy status, other than to drugs and biological substances: Secondary | ICD-10-CM

## 2017-12-26 ENCOUNTER — Other Ambulatory Visit: Payer: Self-pay | Admitting: Family Medicine

## 2017-12-26 DIAGNOSIS — E559 Vitamin D deficiency, unspecified: Secondary | ICD-10-CM

## 2018-01-09 ENCOUNTER — Ambulatory Visit (INDEPENDENT_AMBULATORY_CARE_PROVIDER_SITE_OTHER): Payer: Medicare Other | Admitting: Family Medicine

## 2018-01-09 VITALS — BP 136/78

## 2018-01-09 DIAGNOSIS — I10 Essential (primary) hypertension: Secondary | ICD-10-CM

## 2018-01-09 NOTE — Progress Notes (Signed)
patient came in for a bp check today it was 136/78 Manually. Patient was advised to continue taking all medications as prescribed and to keep follow up in 3 months. Verbalized understanding. Thanks!

## 2018-01-23 ENCOUNTER — Other Ambulatory Visit: Payer: Self-pay | Admitting: Family Medicine

## 2018-01-23 DIAGNOSIS — Z9109 Other allergy status, other than to drugs and biological substances: Secondary | ICD-10-CM

## 2018-03-09 ENCOUNTER — Other Ambulatory Visit: Payer: Self-pay | Admitting: Family Medicine

## 2018-03-09 DIAGNOSIS — M810 Age-related osteoporosis without current pathological fracture: Secondary | ICD-10-CM

## 2018-03-11 ENCOUNTER — Ambulatory Visit: Payer: Medicare Other | Admitting: Family Medicine

## 2018-03-25 ENCOUNTER — Ambulatory Visit (INDEPENDENT_AMBULATORY_CARE_PROVIDER_SITE_OTHER): Payer: Medicare Other | Admitting: Family Medicine

## 2018-03-25 ENCOUNTER — Encounter: Payer: Self-pay | Admitting: Family Medicine

## 2018-03-25 VITALS — BP 146/80 | HR 72 | Temp 98.9°F | Resp 16 | Ht <= 58 in | Wt 161.0 lb

## 2018-03-25 DIAGNOSIS — J301 Allergic rhinitis due to pollen: Secondary | ICD-10-CM | POA: Diagnosis not present

## 2018-03-25 DIAGNOSIS — E559 Vitamin D deficiency, unspecified: Secondary | ICD-10-CM

## 2018-03-25 DIAGNOSIS — Z8669 Personal history of other diseases of the nervous system and sense organs: Secondary | ICD-10-CM

## 2018-03-25 DIAGNOSIS — Z789 Other specified health status: Secondary | ICD-10-CM

## 2018-03-25 DIAGNOSIS — E785 Hyperlipidemia, unspecified: Secondary | ICD-10-CM | POA: Diagnosis not present

## 2018-03-25 DIAGNOSIS — I10 Essential (primary) hypertension: Secondary | ICD-10-CM

## 2018-03-25 LAB — POCT URINALYSIS DIPSTICK
Bilirubin, UA: NEGATIVE
Glucose, UA: NEGATIVE
Ketones, UA: NEGATIVE
LEUKOCYTES UA: NEGATIVE
NITRITE UA: NEGATIVE
Protein, UA: NEGATIVE
Spec Grav, UA: 1.025 (ref 1.010–1.025)
Urobilinogen, UA: 1 E.U./dL
pH, UA: 6 (ref 5.0–8.0)

## 2018-03-25 MED ORDER — OLOPATADINE HCL 0.2 % OP SOLN: 1.0000 [drp] | Freq: Every day | OPHTHALMIC | 0 refills | Status: DC

## 2018-03-25 NOTE — Progress Notes (Signed)
Subjective:    Patient ID: Elizabeth Jennings, female    DOB: 07-Oct-1946, 72 y.o.   MRN: [id number]  HPI Elizabeth Jennings, a 72 year old female with a history of hypertension, osteoporosis, prediabetes, obesity, and vitamin D deficiency presents for a follow up of chronic conditions. Patient primarily speaks spanish, will use interpreter to assist with communication. Patient in accompanied by friend. Ms. Bartz has been lost to follow up over the past year. Ms. Dante says that she has been following a low fat diet and increased daily activity level. Also, Ms. Tellez does not check blood pressures at home. Patient denies headache, dizziness, chest pain, shortness of breath, or bilateral lower extremity edema. Cardiac risk factors include sedentary lifestyle, obesity, dyslipidemia, and hypertension.   Ms. Richer also has a history of hemorrhoids. She has been using hydrocortisone to external anal area consistently.  Patient denies maroon colored stools, melena and weight loss.  Past Medical History:  Diagnosis Date  . Hemorrhoids   . Hyperlipidemia   . Hypertension   . Osteoporosis   . Prediabetes    Social History   Socioeconomic History  . Marital status: Divorced    Spouse name: Not on file  . Number of children: Not on file  . Years of education: Not on file  . Highest education level: Not on file  Occupational History  . Not on file  Social Needs  . Financial resource strain: Not on file  . Food insecurity:    Worry: Not on file    Inability: Not on file  . Transportation needs:    Medical: Not on file    Non-medical: Not on file  Tobacco Use  . Smoking status: Never Smoker  . Smokeless tobacco: Never Used  Substance and Sexual Activity  . Alcohol use: No  . Drug use: No  . Sexual activity: Not on file  Lifestyle  . Physical activity:    Days per week: Not on file    Minutes per session: Not on file  . Stress: Not on file  Relationships  . Social connections:     Talks on phone: Not on file    Gets together: Not on file    Attends religious service: Not on file    Active member of club or organization: Not on file    Attends meetings of clubs or organizations: Not on file    Relationship status: Not on file  . Intimate partner violence:    Fear of current or ex partner: Not on file    Emotionally abused: Not on file    Physically abused: Not on file    Forced sexual activity: Not on file  Other Topics Concern  . Not on file  Social History Narrative  . Not on file   Immunization History  Administered Date(s) Administered  . Influenza,inj,Quad PF,6+ Mos 12/13/2016, 12/11/2017  . Pneumococcal Conjugate-13 04/10/2016, 12/11/2017  . Tdap 04/10/2016     Current Outpatient Medications:  .  acetaminophen (TYLENOL) 500 MG tablet, Take 500 mg by mouth every 6 (six) hours as needed., Disp: , Rfl:  .  alendronate (FOSAMAX) 70 MG tablet, TAKE 1 TABLET BY MOUTH ONCE A WEEK ON AN EMPTY STOMACH AND WITH A FULL GLASS OF WATER, Disp: 12 tablet, Rfl: 0 .  amitriptyline (ELAVIL) 10 MG tablet, Take 10 mg by mouth at bedtime. Reported on 04/10/2016, Disp: , Rfl:  .  aspirin 81 MG tablet, Take 81 mg by mouth daily., Disp: , Rfl:  .  celecoxib (CELEBREX) 100 MG capsule, Take 1 capsule (100 mg total) by mouth 2 (two) times daily., Disp: 60 capsule, Rfl: 5 .  cetirizine (ZYRTEC) 10 MG tablet, Take 1 tablet (10 mg total) by mouth daily., Disp: 30 tablet, Rfl: 11 .  cetirizine (ZYRTEC) 10 MG tablet, TAKE 1 TABLET BY MOUTH DAILY, Disp: 30 tablet, Rfl: 0 .  hydrocortisone (ANUSOL-HC) 2.5 % rectal cream, Place 1 application rectally 2 (two) times daily., Disp: 30 g, Rfl: 0 .  losartan (COZAAR) 50 MG tablet, Take 1 tablet (50 mg total) by mouth daily., Disp: 90 tablet, Rfl: 1 .  Multiple Vitamins-Minerals (ICAPS AREDS 2 PO), Take by mouth., Disp: , Rfl:  .  PROCTOZONE-HC 2.5 % rectal cream, USE 1 APPLICATORFUL RECTALLY TWICE DAILY, Disp: 30 g, Rfl: 0 .  Selenium  Sulfide 2.25 % SHAM, Apply 1 application topically once a week., Disp: 1 Bottle, Rfl: 1 .  Vitamin D, Ergocalciferol, (DRISDOL) 50000 units CAPS capsule, TAKE 1 CAPSULE BY MOUTH 1 TIME A WEEK, Disp: 12 capsule, Rfl: 0 Review of Systems  Constitutional: Positive for unexpected weight change. Negative for fatigue.  HENT: Negative.   Eyes: Negative.   Respiratory: Negative for shortness of breath.   Cardiovascular: Negative.   Gastrointestinal: Negative.   Endocrine: Negative for polydipsia, polyphagia and polyuria.  Genitourinary:       Anal itching  Musculoskeletal: Negative.   Skin: Negative.   Allergic/Immunologic: Negative for immunocompromised state.  Neurological: Negative.   Hematological: Negative.   Psychiatric/Behavioral: Negative.        Objective:   Physical Exam  Constitutional: She appears well-developed and well-nourished.  HENT:  Head: Normocephalic and atraumatic.  Right Ear: External ear normal.  Left Ear: External ear normal.  Mouth/Throat: Oropharynx is clear and moist.  Eyes: Pupils are equal, round, and reactive to light.  Neck: Normal range of motion. Neck supple.  Cardiovascular: Normal rate and regular rhythm.  Pulmonary/Chest: Effort normal.  Abdominal: Soft. Bowel sounds are normal.  Increased abdominal girth  Genitourinary: Rectal exam shows external hemorrhoid. Rectal exam shows guaiac negative stool.  Psychiatric: She has a normal mood and affect. Her speech is normal and behavior is normal. Judgment and thought content normal. Cognition and memory are normal.    BP (!) 146/80 (BP Location: Left Arm, Patient Position: Sitting, Cuff Size: Normal) Comment: manually  Pulse 72   Temp 98.9 F (37.2 C) (Oral)   Resp 16   Ht  (1.422 m)   Wt 161 lb (73 kg)   SpO2 100%   BMI 36.10 kg/m     Assessment & Plan:  1. Essential hypertension Blood pressure is at goal on current medication regimen, no medication changes warranted on today. We have  discussed target BP range and blood pressure goal. I have advised patient to check BP regularly and to call us back or report to clinic if the numbers are consistently higher than 140/90. We discussed the importance of compliance with medical therapy and DASH diet recommended, consequences of uncontrolled hypertension discussed.  - continue current BP medications  - Urinalysis Dipstick - Comprehensive metabolic panel  2. Vitamin D deficiency - Vitamin D, 25-hydroxy  3. History of itching of eye - Olopatadine HCl 0.2 % SOLN; Apply 1 drop to eye daily.  Dispense: 1 Bottle; Refill: 0  4. Allergic rhinitis due to pollen, unspecified seasonality Continue cetirizine 10 mg at bedtime  5. Hyperlipidemia LDL goal <70 The 10-year ASCVD risk score Denman George DC Montez Hageman., et al.,  2013) is: 14.8%   Values used to calculate the score:     Age: 46 years     Sex: Female     Is Non-Hispanic African American: No     Diabetic: No     Tobacco smoker: No     Systolic Blood Pressure: 146 mmHg     Is BP treated: Yes     HDL Cholesterol: 48 mg/dL     Total Cholesterol: 137 mg/dL  - Lipid Panel; Future  6. Language barrier to communication Patient primarily speaks Spanish, utilizing interpreter to assist with communication    RTC: 3 months for fasting lipid panel in 6 months for office visit   Nolon Nations  MSN, FNP-C Patient Care Center [facility] [address] [place], [place] [address] [phone]

## 2018-03-25 NOTE — Patient Instructions (Addendum)
Para las hemorroides y picazn anal, recomendar OTC Hamamelis (aplicar con algodn)  Immunologist, 1 gota a cada ojo Community education officer cetirizina 10 mg al [employer] seguimiento por telfono con cualquier resultado anormal de laboratorio  Contine con la medicacin, controle la presin arterial en casa. Contina la dieta DASH.  Recordatorio para ir a la ER si cualquier CP, SOB, nuseas, mareo, HA grave, cambios de visin/habla, entumecimiento del brazo izquierdo y el hormigueo y Engineer, mining de la [place].    For hemorrhoids and anal itching, recommend OTC witch hazel (apply with cotton)  Recommend Pataday drops, 1 drop to each eye Continue cetirizine 10 mg daily  Will follow up by phone with any abnormal laboratory results   Continue medication, monitor blood pressure at home. Continue DASH diet. Reminder to go to the ER if any CP, SOB, nausea, dizziness, severe HA, changes vision/speech, left arm numbness and tingling and jaw pain.

## 2018-03-26 LAB — COMPREHENSIVE METABOLIC PANEL
ALBUMIN: 4.4 g/dL (ref 3.5–4.8)
ALK PHOS: 85 IU/L (ref 39–117)
ALT: 24 IU/L (ref 0–32)
AST: 30 IU/L (ref 0–40)
Albumin/Globulin Ratio: 1.4 (ref 1.2–2.2)
BILIRUBIN TOTAL: 0.3 mg/dL (ref 0.0–1.2)
BUN / CREAT RATIO: 28 (ref 12–28)
BUN: 17 mg/dL (ref 8–27)
CO2: 20 mmol/L (ref 20–29)
Calcium: 9.2 mg/dL (ref 8.7–10.3)
Chloride: 108 mmol/L — ABNORMAL HIGH (ref 96–106)
Creatinine, Ser: 0.61 mg/dL (ref 0.57–1.00)
GFR calc non Af Amer: 92 mL/min/{1.73_m2} (ref >59)
GFR, EST AFRICAN AMERICAN: 106 mL/min/{1.73_m2} (ref >59)
GLOBULIN, TOTAL: 3.1 g/dL (ref 1.5–4.5)
Glucose: 96 mg/dL (ref 65–99)
Potassium: 3.9 mmol/L (ref 3.5–5.2)
SODIUM: 145 mmol/L — AB (ref 134–144)
TOTAL PROTEIN: 7.5 g/dL (ref 6.0–8.5)

## 2018-03-26 LAB — VITAMIN D 25 HYDROXY (VIT D DEFICIENCY, FRACTURES): VIT D 25 HYDROXY: 29 ng/mL — AB (ref 30.0–100.0)

## 2018-03-27 ENCOUNTER — Ambulatory Visit: Payer: Medicare Other | Admitting: Family Medicine

## 2018-04-01 ENCOUNTER — Other Ambulatory Visit: Payer: Self-pay | Admitting: Family Medicine

## 2018-04-01 DIAGNOSIS — E559 Vitamin D deficiency, unspecified: Secondary | ICD-10-CM

## 2018-04-15 ENCOUNTER — Other Ambulatory Visit: Payer: Self-pay | Admitting: Family Medicine

## 2018-04-15 DIAGNOSIS — Z1231 Encounter for screening mammogram for malignant neoplasm of breast: Secondary | ICD-10-CM

## 2018-04-17 ENCOUNTER — Other Ambulatory Visit: Payer: Self-pay | Admitting: Family Medicine

## 2018-04-17 DIAGNOSIS — K649 Unspecified hemorrhoids: Secondary | ICD-10-CM

## 2018-04-17 DIAGNOSIS — Z9109 Other allergy status, other than to drugs and biological substances: Secondary | ICD-10-CM

## 2018-05-27 ENCOUNTER — Ambulatory Visit
Admission: RE | Admit: 2018-05-27 | Discharge: 2018-05-27 | Disposition: A | Discharge status: Home / Self Care | Payer: Medicare Other | Source: Ambulatory Visit | Attending: Family Medicine | Admitting: Family Medicine

## 2018-05-27 DIAGNOSIS — Z1231 Encounter for screening mammogram for malignant neoplasm of breast: Secondary | ICD-10-CM

## 2018-05-28 DIAGNOSIS — H353132 Nonexudative age-related macular degeneration, bilateral, intermediate dry stage: Secondary | ICD-10-CM | POA: Diagnosis not present

## 2018-05-28 DIAGNOSIS — Z961 Presence of intraocular lens: Secondary | ICD-10-CM | POA: Diagnosis not present

## 2018-05-28 DIAGNOSIS — H35033 Hypertensive retinopathy, bilateral: Secondary | ICD-10-CM | POA: Diagnosis not present

## 2018-05-28 DIAGNOSIS — H04123 Dry eye syndrome of bilateral lacrimal glands: Secondary | ICD-10-CM | POA: Diagnosis not present

## 2018-06-05 ENCOUNTER — Other Ambulatory Visit: Payer: Self-pay | Admitting: Family Medicine

## 2018-06-05 DIAGNOSIS — I10 Essential (primary) hypertension: Secondary | ICD-10-CM

## 2018-06-25 ENCOUNTER — Other Ambulatory Visit: Payer: Medicare Other

## 2018-06-25 DIAGNOSIS — E785 Hyperlipidemia, unspecified: Secondary | ICD-10-CM | POA: Diagnosis not present

## 2018-06-26 LAB — LIPID PANEL
CHOLESTEROL TOTAL: 140 mg/dL (ref 100–199)
Chol/HDL Ratio: 3.7 ratio (ref 0.0–4.4)
HDL: 38 mg/dL — ABNORMAL LOW (ref >39)
LDL CALC: 27 mg/dL (ref 0–99)
TRIGLYCERIDES: 375 mg/dL — AB (ref 0–149)
VLDL Cholesterol Cal: 75 mg/dL — ABNORMAL HIGH (ref 5–40)

## 2018-08-19 ENCOUNTER — Other Ambulatory Visit: Payer: Self-pay | Admitting: Family Medicine

## 2018-08-19 DIAGNOSIS — Z9109 Other allergy status, other than to drugs and biological substances: Secondary | ICD-10-CM

## 2018-08-19 DIAGNOSIS — E559 Vitamin D deficiency, unspecified: Secondary | ICD-10-CM

## 2018-09-07 ENCOUNTER — Other Ambulatory Visit: Payer: Self-pay

## 2018-09-07 DIAGNOSIS — I10 Essential (primary) hypertension: Secondary | ICD-10-CM

## 2018-09-07 MED ORDER — LOSARTAN POTASSIUM 50 MG PO TABS: ORAL_TABLET | ORAL | 0 refills | Status: DC

## 2018-09-11 ENCOUNTER — Other Ambulatory Visit: Payer: Self-pay

## 2018-09-11 DIAGNOSIS — I10 Essential (primary) hypertension: Secondary | ICD-10-CM

## 2018-09-11 MED ORDER — LOSARTAN POTASSIUM 50 MG PO TABS: ORAL_TABLET | ORAL | 0 refills | Status: DC

## 2018-09-25 ENCOUNTER — Ambulatory Visit: Payer: Medicare Other | Admitting: Family Medicine

## 2018-10-07 ENCOUNTER — Ambulatory Visit: Payer: Medicare Other | Admitting: Family Medicine

## 2018-10-22 ENCOUNTER — Ambulatory Visit: Payer: Medicare Other | Admitting: Family Medicine

## 2018-11-23 ENCOUNTER — Ambulatory Visit: Payer: Medicare Other | Admitting: Family Medicine

## 2018-12-03 ENCOUNTER — Ambulatory Visit (INDEPENDENT_AMBULATORY_CARE_PROVIDER_SITE_OTHER): Payer: Medicare Other | Admitting: Family Medicine

## 2018-12-03 ENCOUNTER — Encounter: Payer: Self-pay | Admitting: Family Medicine

## 2018-12-03 VITALS — BP 142/73 | HR 73 | Temp 98.5°F | Resp 16 | Ht <= 58 in | Wt 158.0 lb

## 2018-12-03 DIAGNOSIS — E538 Deficiency of other specified B group vitamins: Secondary | ICD-10-CM

## 2018-12-03 DIAGNOSIS — Z23 Encounter for immunization: Secondary | ICD-10-CM

## 2018-12-03 DIAGNOSIS — I10 Essential (primary) hypertension: Secondary | ICD-10-CM

## 2018-12-03 DIAGNOSIS — M1990 Unspecified osteoarthritis, unspecified site: Secondary | ICD-10-CM | POA: Diagnosis not present

## 2018-12-03 DIAGNOSIS — G629 Polyneuropathy, unspecified: Secondary | ICD-10-CM

## 2018-12-03 DIAGNOSIS — M810 Age-related osteoporosis without current pathological fracture: Secondary | ICD-10-CM | POA: Diagnosis not present

## 2018-12-03 DIAGNOSIS — J40 Bronchitis, not specified as acute or chronic: Secondary | ICD-10-CM

## 2018-12-03 LAB — POCT URINALYSIS DIPSTICK
Bilirubin, UA: NEGATIVE
Blood, UA: NEGATIVE
Glucose, UA: NEGATIVE
Nitrite, UA: NEGATIVE
Protein, UA: POSITIVE — AB
Spec Grav, UA: 1.02 (ref 1.010–1.025)
Urobilinogen, UA: 2 E.U./dL — AB
pH, UA: 7 (ref 5.0–8.0)

## 2018-12-03 MED ORDER — ALBUTEROL SULFATE HFA 108 (90 BASE) MCG/ACT IN AERS: 2.0000 | INHALATION_SPRAY | Freq: Four times a day (QID) | RESPIRATORY_TRACT | 2 refills | Status: DC | PRN

## 2018-12-03 MED ORDER — CELECOXIB 100 MG PO CAPS: 100.0000 mg | ORAL_CAPSULE | Freq: Two times a day (BID) | ORAL | 5 refills | Status: DC

## 2018-12-03 MED ORDER — IPRATROPIUM BROMIDE 0.02 % IN SOLN
0.5000 mg | Freq: Once | RESPIRATORY_TRACT | Status: AC
  Administered 2018-12-03: 0.5 mg via RESPIRATORY_TRACT

## 2018-12-03 MED ORDER — ALENDRONATE SODIUM 70 MG PO TABS: ORAL_TABLET | ORAL | 0 refills | Status: DC

## 2018-12-03 MED ORDER — AMOXICILLIN-POT CLAVULANATE 875-125 MG PO TABS: 1.0000 | ORAL_TABLET | Freq: Two times a day (BID) | ORAL | 0 refills | Status: AC

## 2018-12-03 MED ORDER — ALBUTEROL SULFATE (2.5 MG/3ML) 0.083% IN NEBU
2.5000 mg | INHALATION_SOLUTION | Freq: Once | RESPIRATORY_TRACT | Status: AC
  Administered 2018-12-03: 2.5 mg via RESPIRATORY_TRACT

## 2018-12-03 MED ORDER — BENZONATATE 100 MG PO CAPS: 100.0000 mg | ORAL_CAPSULE | Freq: Three times a day (TID) | ORAL | 0 refills | Status: DC | PRN

## 2018-12-03 NOTE — Patient Instructions (Addendum)
I sent an inhaler, antibiotics, and cough pills for your bronchitis.  It was a pleasure meeting you and Elizabeth Jennings today.     Bronquitis aguda en los adultos Acute Bronchitis, Adult La bronquitis aguda es la hinchazn repentina de las vas areas (bronquios) en los pulmones. Esta afeccin puede dificultar la respiracin. Adems, puede provocar los siguientes sntomas:  Tos.  Despedir Neomia Dear mucosidad transparente, amarilla o verde al toser.  Sibilancias.  Congestin en el pecho.  Falta de aire.  Grant Ruts.  Dolores [employer] cuerpo.  Escalofros.  Dolor de Advertising copywriter. Siga estas indicaciones en su casa:  Medicamentos  [employer] de venta libre y los recetados solamente como se lo haya indicado el mdico.  Si le recetaron un antibitico, tmelo como se lo haya indicado el mdico. No deje de tomar el antibitico aunque comience a sentirse mejor. Instrucciones generales  Reposo.  Beba suficiente lquido para Radio producer pis (orina) de color amarillo plido.  Evite fumar o aspirar el humo de otros fumadores. Si necesita ayuda para dejar de fumar, consulte al mdico. Dejar de fumar ayudar a que los pulmones se curen ms rpido.  Use un inhalador, un humidificador o un vaporizador de vapor fro tal como se lo haya indicado el mdico.  Concurra a todas las visitas de seguimiento como se lo haya indicado el mdico. Esto es importante. Cmo se evita? Para disminuir el riesgo de volver a sufrir esta afeccin:  Lvese las manos frecuentemente con agua y [place]. Use un desinfectante para manos si no dispone de [place] y [place].  Evite el contacto con personas que tienen sntomas de resfro.  Trate de no llevar las manos a la boca, la nariz o los ojos.  Recuerde aplicarse la vacuna contra la gripe todos los [place]. Comunquese con un mdico si:  Los sntomas no mejoran en un plazo de 2semanas. Solicite ayuda de inmediato si:  Tose y [employer].  Siente dolor en el  pecho.  Sufre un episodio muy intenso de falta de aire.  Se deshidrata.  Se desmaya (pierde el conocimiento) o tiene una sensacin constante de desvanecimiento.  No deja de vomitar.  Siente un dolor de cabeza muy intenso.  La fiebre o los escalofros empeoran. Esta informacin no tiene Theme park manager el consejo del mdico. Asegrese de hacerle al mdico cualquier pregunta que tenga. Document Released: 07/07/2013 Document Revised: 08/27/2017 Document Reviewed: 04/24/2016 Elsevier Interactive Patient Education  2019 [employer].

## 2018-12-03 NOTE — Progress Notes (Signed)
[facility]   Progress Note: General Provider: Mike Gip, FNP  SUBJECTIVE:   Elizabeth Jennings is a 73 y.o. female who  has a past medical history of Hemorrhoids, Hyperlipidemia, Hypertension, Osteoporosis, and Prediabetes.Patient presents today for Hypertension; Headache; Fatigue; and Eye Problem (itching )   Review of Systems  Constitutional: Negative.   HENT: Negative.   Eyes: Negative.        Itchy Eyes.   Respiratory: Positive for cough and sputum production.   Cardiovascular: Negative.   Gastrointestinal: Negative.   Genitourinary: Negative.   Musculoskeletal: Negative.   Skin: Negative.   Neurological: Negative.   Psychiatric/Behavioral: Negative.      OBJECTIVE: BP (!) 142/73 (BP Location: Left Arm, Patient Position: Sitting, Cuff Size: Normal)   Pulse 73   Temp 98.5 F (36.9 C) (Oral)   Resp 16   Ht 4\' 8"  (1.422 m)   Wt 158 lb (71.7 kg)   SpO2 97%   BMI 35.42 kg/m   Wt Readings from Last 3 Encounters:  12/03/18 158 lb (71.7 kg)  03/25/18 161 lb (73 kg)  12/11/17 168 lb (76.2 kg)     Physical Exam Vitals signs and nursing note reviewed.  Constitutional:      General: She is not in acute distress.    Appearance: She is well-developed.  HENT:     Head: Normocephalic and atraumatic.  Eyes:     Conjunctiva/sclera: Conjunctivae normal.     Pupils: Pupils are equal, round, and reactive to light.  Neck:     Musculoskeletal: Normal range of motion.  Cardiovascular:     Rate and Rhythm: Normal rate and regular rhythm.     Heart sounds: Normal heart sounds.  Pulmonary:     Effort: Pulmonary effort is normal. No respiratory distress.     Breath sounds: Wheezing present.  Abdominal:     General: Bowel sounds are normal. There is no distension.     Palpations: Abdomen is soft.  Musculoskeletal: Normal range of motion.  Skin:    General: Skin is warm and dry.  Neurological:     Mental Status: She is  alert and oriented to person, place, and time.  Psychiatric:        Mood and Affect: Mood normal.        Behavior: Behavior normal.        Thought Content: Thought content normal.        Judgment: Judgment normal.     ASSESSMENT/PLAN:  1. Essential hypertension - Urinalysis Dipstick - Comprehensive metabolic panel  2. Osteoporosis, unspecified osteoporosis type, unspecified pathological fracture presence - Vitamin B12 - Vitamin D, 25-hydroxy - alendronate (FOSAMAX) 70 MG tablet; TAKE 1 TABLET BY MOUTH ONCE A WEEK ON AN EMPTY STOMACH AND WITH A FULL GLASS OF WATER  Dispense: 12 tablet; Refill: 0  3. Arthritis - celecoxib (CELEBREX) 100 MG capsule; Take 1 capsule (100 mg total) by mouth 2 (two) times daily.  Dispense: 60 capsule; Refill: 5  4. Bronchitis - albuterol (PROVENTIL) (2.5 MG/3ML) 0.083% nebulizer solution 2.5 mg - ipratropium (ATROVENT) nebulizer solution 0.5 mg - amoxicillin-clavulanate (AUGMENTIN) 875-125 MG tablet; Take 1 tablet by mouth every 12 (twelve) hours for 7 days.  Dispense: 14 tablet; Refill: 0 - albuterol (PROVENTIL HFA;VENTOLIN HFA) 108 (90 Base) MCG/ACT inhaler; Inhale 2 puffs into the lungs every 6 (six) hours as needed for wheezing or shortness of breath.  Dispense: 1 Inhaler; Refill: 2  5. Neuropathy - Vitamin  B12  6. B12 deficiency - Vitamin B12     Return in about 6 months (around 06/03/2019) for htn.    The patient was given clear instructions to go to ER or return to medical center if symptoms do not improve, worsen or new problems develop. The patient verbalized understanding and agreed with plan of care.   Ms. Freda Jackson. Riley Lam, FNP-BC Patient Care Center [facility] [address] [place], [place] [address] [phone]

## 2018-12-04 LAB — COMPREHENSIVE METABOLIC PANEL
ALT: 30 IU/L (ref 0–32)
AST: 37 IU/L (ref 0–40)
Albumin/Globulin Ratio: 1.3 (ref 1.2–2.2)
Albumin: 4.5 g/dL (ref 3.5–4.8)
Alkaline Phosphatase: 90 IU/L (ref 39–117)
BUN/Creatinine Ratio: 26 (ref 12–28)
BUN: 18 mg/dL (ref 8–27)
Bilirubin Total: 0.3 mg/dL (ref 0.0–1.2)
CO2: 22 mmol/L (ref 20–29)
Calcium: 9.7 mg/dL (ref 8.7–10.3)
Chloride: 102 mmol/L (ref 96–106)
Creatinine, Ser: 0.68 mg/dL (ref 0.57–1.00)
GFR calc Af Amer: 102 mL/min/{1.73_m2} (ref >59)
GFR calc non Af Amer: 88 mL/min/{1.73_m2} (ref >59)
Globulin, Total: 3.4 g/dL (ref 1.5–4.5)
Glucose: 90 mg/dL (ref 65–99)
Potassium: 4.2 mmol/L (ref 3.5–5.2)
Sodium: 142 mmol/L (ref 134–144)
Total Protein: 7.9 g/dL (ref 6.0–8.5)

## 2018-12-04 LAB — VITAMIN B12: Vitamin B-12: 334 pg/mL (ref 232–1245)

## 2018-12-04 LAB — VITAMIN D 25 HYDROXY (VIT D DEFICIENCY, FRACTURES): Vit D, 25-Hydroxy: 23.5 ng/mL — ABNORMAL LOW (ref 30.0–100.0)

## 2018-12-09 ENCOUNTER — Telehealth: Payer: Self-pay

## 2018-12-09 MED ORDER — VITAMIN D (ERGOCALCIFEROL) 1.25 MG (50000 UNIT) PO CAPS: 50000.0000 [IU] | ORAL_CAPSULE | ORAL | 0 refills | Status: DC

## 2018-12-09 NOTE — Telephone Encounter (Signed)
Patient's interpreter called back with patient. I advised that labs are stable besides vitamin D. Advised that we have sent in a vitamin D rx once weekly until complete. Advised that when rx is completed to take otc vitamin d 1000 units daily. interpreter verbalized understanding and discussed this with patient. Thanks!

## 2018-12-09 NOTE — Telephone Encounter (Signed)
-----   Message from Mike Gip, FNP sent at 12/09/2018  9:10 AM EST ----- Low Vitamin D. All other labs are stable. I will send Vit D to the pharmacy. After completion of the prescription, patient will need otc vitamin D of 1,000units daily.

## 2018-12-09 NOTE — Telephone Encounter (Signed)
Called using [employer] [phone]. NO answer. A voicemail was left to call back regarding lab results. Thanks!

## 2018-12-14 ENCOUNTER — Other Ambulatory Visit: Payer: Self-pay

## 2018-12-14 DIAGNOSIS — I10 Essential (primary) hypertension: Secondary | ICD-10-CM

## 2018-12-14 MED ORDER — LOSARTAN POTASSIUM 50 MG PO TABS: ORAL_TABLET | ORAL | 0 refills | Status: DC

## 2019-02-01 ENCOUNTER — Other Ambulatory Visit: Payer: Self-pay | Admitting: Family Medicine

## 2019-03-19 ENCOUNTER — Other Ambulatory Visit: Payer: Self-pay | Admitting: Family Medicine

## 2019-03-19 DIAGNOSIS — I10 Essential (primary) hypertension: Secondary | ICD-10-CM

## 2019-04-01 DIAGNOSIS — H04123 Dry eye syndrome of bilateral lacrimal glands: Secondary | ICD-10-CM | POA: Diagnosis not present

## 2019-04-01 DIAGNOSIS — Z961 Presence of intraocular lens: Secondary | ICD-10-CM | POA: Diagnosis not present

## 2019-04-01 DIAGNOSIS — H353132 Nonexudative age-related macular degeneration, bilateral, intermediate dry stage: Secondary | ICD-10-CM | POA: Diagnosis not present

## 2019-04-01 DIAGNOSIS — H1045 Other chronic allergic conjunctivitis: Secondary | ICD-10-CM | POA: Diagnosis not present

## 2019-04-06 ENCOUNTER — Other Ambulatory Visit: Payer: Self-pay | Admitting: Family Medicine

## 2019-04-06 DIAGNOSIS — M1990 Unspecified osteoarthritis, unspecified site: Secondary | ICD-10-CM

## 2019-04-21 ENCOUNTER — Other Ambulatory Visit: Payer: Self-pay | Admitting: Family Medicine

## 2019-05-03 ENCOUNTER — Other Ambulatory Visit: Payer: Self-pay | Admitting: Family Medicine

## 2019-05-03 DIAGNOSIS — Z1231 Encounter for screening mammogram for malignant neoplasm of breast: Secondary | ICD-10-CM

## 2019-06-03 ENCOUNTER — Ambulatory Visit: Payer: Medicare Other | Admitting: Family Medicine

## 2019-06-04 ENCOUNTER — Ambulatory Visit: Payer: Medicare Other

## 2019-06-18 ENCOUNTER — Ambulatory Visit: Payer: Medicare Other | Admitting: Family Medicine

## 2019-06-19 ENCOUNTER — Other Ambulatory Visit: Payer: Self-pay | Admitting: Family Medicine

## 2019-07-28 ENCOUNTER — Encounter (HOSPITAL_COMMUNITY): Payer: Self-pay | Admitting: *Deleted

## 2019-07-28 ENCOUNTER — Encounter (HOSPITAL_COMMUNITY): Payer: Self-pay

## 2019-09-11 ENCOUNTER — Other Ambulatory Visit: Payer: Self-pay | Admitting: Family Medicine

## 2019-09-20 ENCOUNTER — Ambulatory Visit
Admission: RE | Admit: 2019-09-20 | Discharge: 2019-09-20 | Disposition: A | Discharge status: Home / Self Care | Payer: Medicare Other | Source: Ambulatory Visit | Attending: Family Medicine | Admitting: Family Medicine

## 2019-09-20 ENCOUNTER — Other Ambulatory Visit: Payer: Self-pay

## 2019-09-20 DIAGNOSIS — Z1231 Encounter for screening mammogram for malignant neoplasm of breast: Secondary | ICD-10-CM | POA: Diagnosis not present

## 2019-10-01 ENCOUNTER — Other Ambulatory Visit: Payer: Self-pay | Admitting: Family Medicine

## 2019-10-01 DIAGNOSIS — M1990 Unspecified osteoarthritis, unspecified site: Secondary | ICD-10-CM

## 2019-10-15 ENCOUNTER — Other Ambulatory Visit: Payer: Self-pay | Admitting: Family Medicine

## 2019-10-15 DIAGNOSIS — I10 Essential (primary) hypertension: Secondary | ICD-10-CM

## 2019-11-17 ENCOUNTER — Other Ambulatory Visit: Payer: Self-pay

## 2019-11-17 MED ORDER — VITAMIN D (ERGOCALCIFEROL) 1.25 MG (50000 UNIT) PO CAPS: ORAL_CAPSULE | ORAL | 0 refills | Status: DC

## 2019-12-14 ENCOUNTER — Other Ambulatory Visit: Payer: Self-pay

## 2019-12-14 ENCOUNTER — Encounter: Payer: Self-pay | Admitting: Family Medicine

## 2019-12-14 ENCOUNTER — Ambulatory Visit (INDEPENDENT_AMBULATORY_CARE_PROVIDER_SITE_OTHER): Payer: Medicare Other | Admitting: Family Medicine

## 2019-12-14 VITALS — BP 160/82 | HR 72 | Temp 98.4°F | Resp 16 | Ht <= 58 in | Wt 170.0 lb

## 2019-12-14 DIAGNOSIS — E538 Deficiency of other specified B group vitamins: Secondary | ICD-10-CM | POA: Diagnosis not present

## 2019-12-14 DIAGNOSIS — E559 Vitamin D deficiency, unspecified: Secondary | ICD-10-CM | POA: Diagnosis not present

## 2019-12-14 DIAGNOSIS — Z23 Encounter for immunization: Secondary | ICD-10-CM

## 2019-12-14 DIAGNOSIS — K640 First degree hemorrhoids: Secondary | ICD-10-CM

## 2019-12-14 DIAGNOSIS — Z789 Other specified health status: Secondary | ICD-10-CM

## 2019-12-14 DIAGNOSIS — E785 Hyperlipidemia, unspecified: Secondary | ICD-10-CM | POA: Diagnosis not present

## 2019-12-14 DIAGNOSIS — I10 Essential (primary) hypertension: Secondary | ICD-10-CM

## 2019-12-14 DIAGNOSIS — Z8669 Personal history of other diseases of the nervous system and sense organs: Secondary | ICD-10-CM | POA: Diagnosis not present

## 2019-12-14 LAB — POCT URINALYSIS DIPSTICK
Bilirubin, UA: NEGATIVE
Blood, UA: NEGATIVE
Glucose, UA: NEGATIVE
Nitrite, UA: NEGATIVE
Protein, UA: NEGATIVE
Spec Grav, UA: 1.025 (ref 1.010–1.025)
Urobilinogen, UA: 1 E.U./dL
pH, UA: 6.5 (ref 5.0–8.0)

## 2019-12-14 MED ORDER — OLOPATADINE HCL 0.2 % OP SOLN: 1.0000 [drp] | Freq: Every day | OPHTHALMIC | 2 refills | Status: DC

## 2019-12-14 MED ORDER — HYDROCORTISONE ACETATE 25 MG RE SUPP: 25.0000 mg | Freq: Two times a day (BID) | RECTAL | 0 refills | Status: DC

## 2019-12-14 NOTE — Patient Instructions (Addendum)
COVID-19 Vaccine Information can be found at: [email or web address].uk For questions related to vaccine distribution or appointments, please email [email or web address] or call [phone].     O diabetes mellitus e a nutrio, adultos Diabetes Mellitus and Nutrition, Adult Quando voc sofre de diabetes (diabetes mellitus),  muito importante ter hbitos de alimentao saudveis, uma vez que os seus nveis de acar no sangue (glicose) so significativamente afetados pelo que voc come e bebe. Comer alimentos saudveis nas quantidades apropriadas e aproximadamente nos mesmos horrios todos os dias pode ajudar voc a:  Controlar seu nvel de glicose sangunea.  Reduzir seu risco de doena cardaca.  Melhorar sua presso arterial.  Alcanar ou manter um peso saudvel. Todas as pessoas com diabetes so diferentes, e cada pessoa tem diferentes necessidades em termos de um plano de refeies. Seu mdico poder recomendar que voc colabore com um especialista em nutrio e dieta (nutricionista) para elaborar o melhor plano de refeies para voc. Seu plano de refeies poder variar dependendo de fatores como:  As calorias de que voc necessita.  Os medicamentos que toma.  Seu peso.  Seus nveis de glicose sangunea, presso arterial e colesterol.  Seu nvel de atividade.  Outros quadros clnicos, como doena cardaca ou renal. Como os carboidratos me afetam? Os carboidratos, tambm chamado de acares, afetam o nvel de [employer] do que qualquer outro tipo de [place]. A ingesto de carboidratos aumenta naturalmente a quantidade de glicose no seu sangue. A contagem de carboidratos  um mtodo para acompanhar a quantidade de carboidratos que voc ingere. A contagem de carboidratos  importante para manter sua glicose sangunea em um nvel saudvel, principalmente se voc [place] insulina ou toma certos medicamentos orais para  o diabetes.  importante saber quanto de carboidrato voc pode ingerir com segurana em cada refeio. Isso varia de pessoa para pessoa. Seu nutricionista poder ajud-lo a calcular quanto de carboidrato voc deve consumir em cada refeio e em cada lanche. Alimentos que contm carboidratos incluem:  Po, cereais, arroz, massas e bolachas.  Batatas e milho.  Ervilhas, feijo e lentilhas.  Leite e iogurte.  Frutas e sucos.  Sobremesas, como bolos, biscoitos, sorvete e doces. Como o lcool me afeta? O lcool pode causar uma sbita reduo do nvel de glicose sangunea (hipoglicemia), especialmente se voc usar insulina ou tomar certos medicamentos orais para o diabetes. A hipoglicemia pode ser um quadro clnico potencialmente fatal. Os sintomas de hipoglicemia (sonolncia, vertigem e confuso) so parecidos com os sintomas da ingesto abusiva de lcool. Caso seu mdico diga que o lcool  seguro para voc, siga essas orientaes:  Limite o consumo de bebidas alcolicas a, no mximo, 1 dose por dia para mulheres no grvidas e 2 doses por dia para homens. Uma dose equivale a 12 onas de cerveja, 5 onas de vinho ou 1 ona de bebida destilada.  No beba de estmago vazio.  Mantenha sua hidratao com gua, refrigerante diet ou ch gelado sem acar.  Tenha em mente que o refrigerante normal, suco e outras bebidas podem conter muito acar e devem ser contadas como carboidratos. Quais so as dicas para seguir este plano?  Leia os rtulos dos alimentos  Comece verificando o Higher education careers adviser poro nas "Informaes nutricionais" no rtulo de alimentos e bebidas embalados. A quantidade de calorias, carboidratos, gorduras e outros nutrientes listados no rtulo se baseia em uma poro padro do alimento. Muitos itens contm mais de uma poro por embalagem.  Verifique o total de gramas (g) de carboidratos contidos em uma poro. Voc  pode calcular o nmero de pores de carboidratos em uma poro  dividindo o total de carboidratos por 15. Por exemplo: supondo que um alimento contenha um total de 30 g de carboidratos, isso seria igual a 2 pores de carboidratos.  Verifique o nmero de gramas (g) de gorduras saturadas e trans em uma poro. Escolha alimentos com pouca ou nenhuma quantidade dessas gorduras.  Verifique a quantidade de miligramas (mg) de sal (sdio) em uma poro. A maioria das pessoas deve limitar o consumo de sdio a 2.300 mg por dia.  Sempre verifique as informaes nutricionais dos alimentos rotulados como de "baixo teor de gordura" e "gordura zero". Esses alimentos podem conter elevado teor de acar de adio ou carboidratos refinados e devem ser evitados.  Converse com seu nutricionista para identificar suas metas dirias dos nutrientes listados na tabela. No mercado  Evite comprar alimentos enlatados, semiprontos ou processados. Esses alimentos tendem a conter elevado teor de gordura, sdio e acares adicionados.  Escolha itens das reas mais externas da seo de alimentos. Ela inclui frutas e verduras frescas, cereais a granel, carnes frescas e produtos avirios frescos. Na cozinha  Use mtodos de cozimento de baixo calor, como assar, em vez de mtodos de cozimento de Musician, como fritura por imerso.  Cozinhe com leos saudveis para o corao, como de [place], [place] ou [place].  Evite cozinhar com manteiga, creme ou carnes com elevado teor de [place]. O planejamento das refeies  Consuma refeies e lanches regularmente, de preferncia nos mesmos horrios todos os dias. Evite ficar longos perodos sem comer.  Coma alimentos ricos em [place], como frutas e verduras frescas, feijo e gros integrais. Converse com seu nutricionista sobre quantas pores de carboidratos voc pode comer em cada refeio.  Coma de 4-6 onas de protena magra por dia, como carne magra, frango, peixe, ovos ou tofu. Uma ona de protena magra  igual a: ? 1 ona de carne, frango  ou peixe. ? 1 ovo. ?  xcara de tofu.  Coma alguns alimentos todos os dias que contenham gorduras saudveis, como abacate, nozes, sementes e peixe. Estilo de vida  Verifique sua glicose sangunea regularmente.  Exercite-se regularmente de acordo com as indicaes do seu mdico. Isso pode incluir: ? 150 minutos de exerccios de intensidade moderada ou de intensidade vigorosa por semana. Pode ser uma caminhada leve, andar de bicicleta ou fazer hidroginstica. ? Alongar e Education officer, environmental exerccios de fora, como ioga ou musculao, pelo menos 2 vezes por semana.  Tome medicamentos somente de acordo com as orientaes do seu mdico.  No use nenhum produto que contenha nicotina ou tabaco, como cigarros tradicionais e cigarros eletrnicos. Caso precise de ajuda para parar de fumar, fale com seu mdico.  Consulte-se com um especialista em diabetes para identificar estratgias para lidar com o estresse e quaisquer desafios emocionais ou sociais. Perguntas a fazer ao mdico  Preciso me consultar com um especialista em diabetes?  Preciso me consultar com um nutricionista?  Para que nmero devo ligar se tiver dvidas?  Quais so os melhores horrios para verificar minha glicose sangunea? Onde conseguir mais informaes:  Associao de Diabetes [place] Aliment (American Diabetes Association, ADA): diabetes.org  Academia de Nutrio e Diettica (Academy of Nutrition and Dietetics): www.eatright.[employer] of Diabetes and Digestive and Kidney Diseases [facility] de Diabetes e Doenas Digestivas e Renais): CarFlippers.tn Resumo  Um plano de refeies saudveis ajudar voc a controlar sua glicose sangunea e a manter um estilo de vida saudvel.  Consultar um especialista em nutrio (nutricionista)  poder ajudar na elaborao do melhor plano para voc.  Tenha em mente que carboidratos (acares) e lcool tm efeitos imediatos sobre seus nveis de glicose sangunea.   importante contar os carboidratos e consumir lcool com cautela. Estas informaes no se destinam a substituir as recomendaes de seu mdico. No deixe de discutir quaisquer dvidas com seu mdico. Document Revised: 07/15/2017 Document Reviewed: 03/06/2017 Elsevier Patient Education  2020 [employer].

## 2019-12-15 LAB — COMPREHENSIVE METABOLIC PANEL
ALT: 42 IU/L — ABNORMAL HIGH (ref 0–32)
AST: 43 IU/L — ABNORMAL HIGH (ref 0–40)
Albumin/Globulin Ratio: 1.3 (ref 1.2–2.2)
Albumin: 4.1 g/dL (ref 3.7–4.7)
Alkaline Phosphatase: 98 IU/L (ref 39–117)
BUN/Creatinine Ratio: 13 (ref 12–28)
BUN: 10 mg/dL (ref 8–27)
Bilirubin Total: 0.2 mg/dL (ref 0.0–1.2)
CO2: 23 mmol/L (ref 20–29)
Calcium: 8.6 mg/dL — ABNORMAL LOW (ref 8.7–10.3)
Chloride: 105 mmol/L (ref 96–106)
Creatinine, Ser: 0.77 mg/dL (ref 0.57–1.00)
GFR calc Af Amer: 89 mL/min/{1.73_m2} (ref >59)
GFR calc non Af Amer: 77 mL/min/{1.73_m2} (ref >59)
Globulin, Total: 3.2 g/dL (ref 1.5–4.5)
Glucose: 81 mg/dL (ref 65–99)
Potassium: 4.2 mmol/L (ref 3.5–5.2)
Sodium: 141 mmol/L (ref 134–144)
Total Protein: 7.3 g/dL (ref 6.0–8.5)

## 2019-12-15 LAB — LIPID PANEL
Chol/HDL Ratio: 2.9 ratio (ref 0.0–4.4)
Cholesterol, Total: 138 mg/dL (ref 100–199)
HDL: 47 mg/dL (ref >39)
LDL Chol Calc (NIH): 54 mg/dL (ref 0–99)
Triglycerides: 229 mg/dL — ABNORMAL HIGH (ref 0–149)
VLDL Cholesterol Cal: 37 mg/dL (ref 5–40)

## 2019-12-15 LAB — VITAMIN D 25 HYDROXY (VIT D DEFICIENCY, FRACTURES): Vit D, 25-Hydroxy: 36.7 ng/mL (ref 30.0–100.0)

## 2019-12-15 LAB — VITAMIN B12: Vitamin B-12: 188 pg/mL — ABNORMAL LOW (ref 232–1245)

## 2019-12-19 NOTE — Progress Notes (Signed)
[facility]   Established Patient Office Visit  Subjective:  Patient ID: Elizabeth Jennings, female    DOB: August 26, 1946  Age: 74 y.o. MRN: [id number]  CC:  Chief Complaint  Patient presents with  . Hemorrhoids    wants cream   . Hypertension  . Allergies  . Shoulder Pain    left arm shoulder numbness   . Leg Pain    left leg pain   . Nausea    HPI Elizabeth Jennings, a 74 year old female with a medical history significant for essential hypertension, hemorrhoids, bilateral shoulder pain, osteopenia, vitamin D deficiency, B12 deficiency, and history of prediabetes presents for follow-up of chronic conditions.  Patient states that she has been taking all medications consistently.  She has not been exercising or following a low-fat, low carbohydrate diet.  She states that she has been isolating at home due to COVID-19 pandemic, and has gained a significant amount of weight due to sedentary lifestyle and poor diet.  Patient primarily speaks Spanish, she is accompanied by interpreter.  Patient has not had a colonoscopy over the past 10 years.  She refuses.  She is also not up-to-date with mammograms.  She is inquiring about Pap smear, however patient is greater than 65.  Hypertension This is a chronic problem. The problem is controlled. Pertinent negatives include no blurred vision, chest pain, headaches, orthopnea, palpitations, peripheral edema, shortness of breath or sweats. Risk factors for coronary artery disease include obesity and sedentary lifestyle. Compliance problems include diet and exercise.   Shoulder Pain  The pain is present in the left shoulder and right shoulder. This is a chronic problem. The pain is at a severity of 3/10. The pain is mild. Associated symptoms include stiffness. The symptoms are aggravated by activity. Family history does not include rheumatoid arthritis. Her past medical history is significant for osteoarthritis.    History of hemorrhoids: Mr. [name] has a history of hemorrhoids.  She has periodic hemorrhoid flares with constipation.  Patient notices that constipation increases when she does not get an adequate amount of water, fruit, or vegetables.  She usually utilizes hemorrhoid cream to assist with this problem. Past Medical History:  Diagnosis Date  . Hemorrhoids   . Hyperlipidemia   . Hypertension   . Osteoporosis   . Prediabetes     Past Surgical History:  Procedure Laterality Date  . EYE SURGERY  2017   cataracts both eyes   . EYE SURGERY  2018   eye lid surgery     History reviewed. No pertinent family history.  Social History   Socioeconomic History  . Marital status: Divorced    Spouse name: Not on file  . Number of children: Not on file  . Years of education: Not on file  . Highest education level: Not on file  Occupational History  . Not on file  Tobacco Use  . Smoking status: Never Smoker  . Smokeless tobacco: Never Used  Substance and Sexual Activity  . Alcohol use: No  . Drug use: No  . Sexual activity: Not on file  Other Topics Concern  . Not on file  Social History Narrative  . Not on file   Social Determinants of Health   Financial Resource Strain:   . Difficulty of Paying Living Expenses: Not on file  Food Insecurity:   . Worried About Programme researcher, broadcasting/film/video in the Last Year: Not on file  . Ran Out of Food in the  Last Year: Not on file  Transportation Needs:   . Lack of Transportation (Medical): Not on file  . Lack of Transportation (Non-Medical): Not on file  Physical Activity:   . Days of Exercise per Week: Not on file  . Minutes of Exercise per Session: Not on file  Stress:   . Feeling of Stress : Not on file  Social Connections:   . Frequency of Communication with Friends and Family: Not on file  . Frequency of Social Gatherings with Friends and Family: Not on file  . Attends Religious Services: Not on file  . Active Member of Clubs or  Organizations: Not on file  . Attends Archivist Meetings: Not on file  . Marital Status: Not on file  Intimate Partner Violence:   . Fear of Current or Ex-Partner: Not on file  . Emotionally Abused: Not on file  . Physically Abused: Not on file  . Sexually Abused: Not on file    Outpatient Medications Prior to Visit  Medication Sig Dispense Refill  . acetaminophen (TYLENOL) 500 MG tablet Take 500 mg by mouth every 6 (six) hours as needed.    Marland Kitchen albuterol (PROVENTIL HFA;VENTOLIN HFA) 108 (90 Base) MCG/ACT inhaler Inhale 2 puffs into the lungs every 6 (six) hours as needed for wheezing or shortness of breath. 1 Inhaler 2  . alendronate (FOSAMAX) 70 MG tablet TAKE 1 TABLET BY MOUTH ONCE A WEEK ON AN EMPTY STOMACH AND WITH A FULL GLASS OF WATER 12 tablet 0  . aspirin 81 MG tablet Take 81 mg by mouth daily.    . celecoxib (CELEBREX) 100 MG capsule TAKE 1 CAPSULE(100 MG) BY MOUTH TWICE DAILY 180 capsule 1  . losartan (COZAAR) 50 MG tablet TAKE 1 TABLET(50 MG) BY MOUTH DAILY 90 tablet 0  . Multiple Vitamins-Minerals (ICAPS AREDS 2 PO) Take by mouth.    . Selenium Sulfide 2.25 % SHAM Apply 1 application topically once a week. 1 Bottle 1  . Vitamin D, Ergocalciferol, (DRISDOL) 1.25 MG (50000 UT) CAPS capsule TAKE 1 CAPSULE BY MOUTH EVERY 7 DAYS 8 capsule 0  . Olopatadine HCl 0.2 % SOLN Apply 1 drop to eye daily. 1 Bottle 0  . cetirizine (ZYRTEC) 10 MG tablet Take 1 tablet (10 mg total) by mouth daily. 30 tablet 11  . amitriptyline (ELAVIL) 10 MG tablet Take 10 mg by mouth at bedtime. Reported on 04/10/2016    . benzonatate (TESSALON PERLES) 100 MG capsule Take 1 capsule (100 mg total) by mouth 3 (three) times daily as needed for cough. 20 capsule 0  . PROCTOZONE-HC 2.5 % rectal cream USE 1 APPLICATORFUL RECTALLY TWICE DAILY (Patient not taking: Reported on 12/14/2019) 30 g 0  . PROCTOZONE-HC 2.5 % rectal cream USE RECTALLY TWICE DAILY (Patient not taking: Reported on 12/14/2019) 30 g 0   No  facility-administered medications prior to visit.    No Known Allergies  ROS Review of Systems  Constitutional: Negative.   HENT: Negative.   Eyes: Negative for blurred vision.  Respiratory: Negative for shortness of breath.   Cardiovascular: Negative for chest pain, palpitations and orthopnea.  Gastrointestinal: Negative.   Endocrine: Negative.   Genitourinary: Negative.   Musculoskeletal: Positive for arthralgias (Bilateral shoulders) and stiffness.  Neurological: Negative for headaches.  Psychiatric/Behavioral: Negative.       Objective:    Physical Exam  Constitutional: She is oriented to person, place, and time. She appears well-developed.  Eyes: Pupils are equal, round, and reactive to light.  Cardiovascular: Normal rate and regular rhythm.  Pulmonary/Chest: Effort normal and breath sounds normal.  Abdominal: Soft. Bowel sounds are normal.  Abdominal obesity  Musculoskeletal:     Cervical back: Normal range of motion.  Neurological: She is alert and oriented to person, place, and time.  Skin: Skin is warm and dry. Rash noted.  Psychiatric: She has a normal mood and affect. Her behavior is normal. Judgment and thought content normal.    BP (!) 160/82 (BP Location: Right Arm, Patient Position: Sitting, Cuff Size: Normal) Comment: manually  Pulse 72   Temp 98.4 F (36.9 C) (Oral)   Resp 16   Ht 4\' 8"  (1.422 m)   Wt 170 lb (77.1 kg)   SpO2 98%   BMI 38.11 kg/m  Wt Readings from Last 3 Encounters:  12/14/19 170 lb (77.1 kg)  12/03/18 158 lb (71.7 kg)  03/25/18 161 lb (73 kg)     Health Maintenance Due  Topic Date Due  . Hepatitis C Screening  1946/06/23  . COLONOSCOPY  09/25/1997  . PNA vac Low Risk Adult (2 of 2 - PPSV23) 12/11/2018    There are no preventive care reminders to display for this patient.  No results found for: TSH Lab Results  Component Value Date   WBC 6.8 04/10/2016   HGB 11.8 04/10/2016   HCT 36.9 04/10/2016   MCV 81.5  04/10/2016   PLT 238 04/10/2016   Lab Results  Component Value Date   NA 141 12/14/2019   K 4.2 12/14/2019   CO2 23 12/14/2019   GLUCOSE 81 12/14/2019   BUN 10 12/14/2019   CREATININE 0.77 12/14/2019   BILITOT 0.2 12/14/2019   ALKPHOS 98 12/14/2019   AST 43 (H) 12/14/2019   ALT 42 (H) 12/14/2019   PROT 7.3 12/14/2019   ALBUMIN 4.1 12/14/2019   CALCIUM 8.6 (L) 12/14/2019   Lab Results  Component Value Date   CHOL 138 12/14/2019   Lab Results  Component Value Date   HDL 47 12/14/2019   Lab Results  Component Value Date   LDLCALC 54 12/14/2019   Lab Results  Component Value Date   TRIG 229 (H) 12/14/2019   Lab Results  Component Value Date   CHOLHDL 2.9 12/14/2019   Lab Results  Component Value Date   HGBA1C 5.5 12/11/2017      Assessment & Plan:   Problem List Items Addressed This Visit      Cardiovascular and Mediastinum   Essential hypertension - Primary   Relevant Orders   Urinalysis Dipstick (Completed)   Comprehensive metabolic panel (Completed)     Other   Vitamin D deficiency   Relevant Orders   Vitamin D, 25-hydroxy (Completed)   Language barrier to communication    Other Visit Diagnoses    B12 deficiency       Relevant Orders   Vitamin B12 (Completed)   Hyperlipidemia LDL goal <70       Relevant Orders   Lipid Panel (Completed)   Flu vaccine need       Relevant Orders   Flu Vaccine QUAD 36+ mos IM (Fluarix & Fluzone Quad PF (Completed)   History of itching of eye       Relevant Medications   Olopatadine HCl 0.2 % SOLN   Grade I hemorrhoids       Relevant Medications   hydrocortisone (ANUSOL-HC) 25 MG suppository      Meds ordered this encounter  Medications  . Olopatadine HCl 0.2 % SOLN  Sig: Apply 1 drop to eye daily.    Dispense:  2.5 mL    Refill:  2    Order Specific Question:   Supervising Provider    Answer:   Quentin Angst [id number]  . hydrocortisone (ANUSOL-HC) 25 MG suppository    Sig: Place 1  suppository (25 mg total) rectally 2 (two) times daily.    Dispense:  12 suppository    Refill:  0    Order Specific Question:   Supervising Provider    Answer:   Quentin Angst [id number]   Essential hypertension - Continue medication, monitor blood pressure at home. Continue DASH diet. Reminder to go to the ER if any CP, SOB, nausea, dizziness, severe HA, changes vision/speech, left arm numbness and tingling and jaw pain.   - Urinalysis Dipstick - Comprehensive metabolic panel  B12 deficiency - Vitamin B12  Vitamin D deficiency - Vitamin D, 25-hydroxy  Hyperlipidemia LDL goal <70 The 10-year ASCVD risk score Denman George DC Jr., et al., 2013) is: 21.9%   Values used to calculate the score:     Age: 16 years     Sex: Female     Is Non-Hispanic African American: No     Diabetic: No     Tobacco smoker: No     Systolic Blood Pressure: 160 mmHg     Is BP treated: Yes     HDL Cholesterol: 47 mg/dL     Total Cholesterol: 138 mg/dL - Lipid Panel  Language barrier to communication Utilize interpreter to assist with communication  Flu vaccine need - Flu Vaccine QUAD 36+ mos IM (Fluarix & Fluzone Quad PF  History of itching of eye - Olopatadine HCl 0.2 % SOLN; Apply 1 drop to eye daily.  Dispense: 2.5 mL; Refill: 2  Grade I hemorrhoids - hydrocortisone (ANUSOL-HC) 25 MG suppository; Place 1 suppository (25 mg total) rectally 2 (two) times daily.  Dispense: 12 suppository; Refill: 0     Follow-up: Return in about 3 months (around 03/13/2020).    Nolon Nations  APRN, MSN, FNP-C Patient Care [facility] [address] [place], [place] [address] [phone]

## 2019-12-21 ENCOUNTER — Telehealth: Payer: Self-pay

## 2019-12-21 NOTE — Telephone Encounter (Signed)
-----   Message from Massie Maroon, [place] sent at 12/20/2019  8:05 PM EST ----- Regarding: lab results Please inform patient that B12 is mildly decrease, restart B12 supplement. Triglycerides are improved. No medication changes warranted. Recommend lowfat, low carbohydrate diet. Discussed at length during appointment. Continue to follow up in clinic as scheduled.   Nolon Nations  APRN, MSN, FNP-C Patient Care [facility] [address] [place], [place] [address] [phone]

## 2019-12-21 NOTE — Telephone Encounter (Signed)
Called using interpreter ID [phone] ([place] with Language resources) No answer. Left a message for patient to call back regarding lab results. Thanks!

## 2019-12-22 ENCOUNTER — Telehealth: Payer: Self-pay | Admitting: Family Medicine

## 2019-12-22 NOTE — Telephone Encounter (Signed)
Called, no answer. Left a message to call back.  

## 2019-12-22 NOTE — Telephone Encounter (Signed)
Called using interpreter [phone]. NO answer left a message to call back. Thanks!

## 2019-12-24 NOTE — Telephone Encounter (Signed)
Called, no answer. Will mail patient a letter.

## 2020-01-04 DIAGNOSIS — H353132 Nonexudative age-related macular degeneration, bilateral, intermediate dry stage: Secondary | ICD-10-CM | POA: Diagnosis not present

## 2020-01-04 DIAGNOSIS — H1045 Other chronic allergic conjunctivitis: Secondary | ICD-10-CM | POA: Diagnosis not present

## 2020-01-04 DIAGNOSIS — H04123 Dry eye syndrome of bilateral lacrimal glands: Secondary | ICD-10-CM | POA: Diagnosis not present

## 2020-01-04 DIAGNOSIS — Z961 Presence of intraocular lens: Secondary | ICD-10-CM | POA: Diagnosis not present

## 2020-01-12 ENCOUNTER — Other Ambulatory Visit: Payer: Self-pay

## 2020-01-12 MED ORDER — VITAMIN D (ERGOCALCIFEROL) 1.25 MG (50000 UNIT) PO CAPS: ORAL_CAPSULE | ORAL | 0 refills | Status: DC

## 2020-02-16 ENCOUNTER — Other Ambulatory Visit: Payer: Self-pay

## 2020-02-16 DIAGNOSIS — I10 Essential (primary) hypertension: Secondary | ICD-10-CM

## 2020-02-16 MED ORDER — LOSARTAN POTASSIUM 50 MG PO TABS: ORAL_TABLET | ORAL | 3 refills | Status: DC

## 2020-02-22 ENCOUNTER — Other Ambulatory Visit: Payer: Self-pay

## 2020-02-22 ENCOUNTER — Ambulatory Visit (INDEPENDENT_AMBULATORY_CARE_PROVIDER_SITE_OTHER): Payer: Medicare Other | Admitting: Family Medicine

## 2020-02-22 ENCOUNTER — Encounter: Payer: Self-pay | Admitting: Family Medicine

## 2020-02-22 VITALS — BP 132/64 | HR 73 | Temp 98.6°F | Resp 16 | Ht <= 58 in | Wt 167.0 lb

## 2020-02-22 DIAGNOSIS — Z789 Other specified health status: Secondary | ICD-10-CM | POA: Diagnosis not present

## 2020-02-22 DIAGNOSIS — E538 Deficiency of other specified B group vitamins: Secondary | ICD-10-CM

## 2020-02-22 DIAGNOSIS — I10 Essential (primary) hypertension: Secondary | ICD-10-CM | POA: Diagnosis not present

## 2020-02-22 DIAGNOSIS — R8290 Unspecified abnormal findings in urine: Secondary | ICD-10-CM

## 2020-02-22 DIAGNOSIS — R7303 Prediabetes: Secondary | ICD-10-CM | POA: Diagnosis not present

## 2020-02-22 LAB — POCT URINALYSIS DIPSTICK
Bilirubin, UA: NEGATIVE
Blood, UA: NEGATIVE
Glucose, UA: NEGATIVE
Ketones, UA: NEGATIVE
Nitrite, UA: NEGATIVE
Protein, UA: NEGATIVE
Spec Grav, UA: 1.015 (ref 1.010–1.025)
Urobilinogen, UA: 1 E.U./dL
pH, UA: 6 (ref 5.0–8.0)

## 2020-02-22 LAB — POCT GLYCOSYLATED HEMOGLOBIN (HGB A1C): Hemoglobin A1C: 5.8 % — AB (ref 4.0–5.6)

## 2020-02-22 NOTE — Progress Notes (Signed)
Patient [facility]   Subjective:  Patient ID: Elizabeth Jennings, female    DOB: 1945/12/21  Age: 74 y.o. MRN: [id number]  CC:  Chief Complaint  Patient presents with  . Hypertension  . Follow-up    pre-diabetes    Elizabeth Jennings, a 74 year old female with a medical history significant for hypertension, hyperlipidemia, prediabetes, osteoporosis, obesity, and hemorrhoids presents for follow-up of chronic conditions.  Patient primarily speaks Spanish, utilizing video interpreter to assist with communication.  Patient states that she has been doing well and is without complaints.  She says that she has been following a low carbohydrate, low-fat diet and has increased physical activity since the weather has gotten warmer.  Hypertension This is a chronic problem. The problem is controlled. Pertinent negatives include no blurred vision, chest pain, headaches, neck pain, orthopnea, palpitations, peripheral edema, shortness of breath or sweats. There are no known risk factors for coronary artery disease. Past treatments include nothing. There are no compliance problems.  There is no history of angina, CVA, heart failure or left ventricular hypertrophy. There is no history of chronic renal disease.  Hyperlipidemia This is a chronic problem. The problem is controlled. Recent lipid tests were reviewed and are normal. Exacerbating diseases include obesity. She has no history of chronic renal disease, diabetes or liver disease. Pertinent negatives include no chest pain or shortness of breath. There are no compliance problems.  Risk factors for coronary artery disease include dyslipidemia and obesity.     Past Medical History:  Diagnosis Date  . Hemorrhoids   . Hyperlipidemia   . Hypertension   . Osteoporosis   . Prediabetes     Past Surgical History:  Procedure Laterality Date  . EYE SURGERY  2017   cataracts both eyes   . EYE SURGERY  2018   eye lid  surgery     History reviewed. No pertinent family history.  Social History   Socioeconomic History  . Marital status: Divorced    Spouse name: Not on file  . Number of children: Not on file  . Years of education: Not on file  . Highest education level: Not on file  Occupational History  . Not on file  Tobacco Use  . Smoking status: Never Smoker  . Smokeless tobacco: Never Used  Substance and Sexual Activity  . Alcohol use: No  . Drug use: No  . Sexual activity: Not on file  Other Topics Concern  . Not on file  Social History Narrative  . Not on file   Social Determinants of Health   Financial Resource Strain:   . Difficulty of Paying Living Expenses:   Food Insecurity:   . Worried About Charity fundraiser in the Last Year:   . Arboriculturist in the Last Year:   Transportation Needs:   . Film/video editor (Medical):   Marland Kitchen Lack of Transportation (Non-Medical):   Physical Activity:   . Days of Exercise per Week:   . Minutes of Exercise per Session:   Stress:   . Feeling of Stress :   Social Connections:   . Frequency of Communication with Friends and Family:   . Frequency of Social Gatherings with Friends and Family:   . Attends Religious Services:   . Active Member of Clubs or Organizations:   . Attends Archivist Meetings:   Marland Kitchen Marital Status:   Intimate Partner Violence:   . Fear of Current or  Ex-Partner:   . Emotionally Abused:   Marland Kitchen Physically Abused:   . Sexually Abused:     Outpatient Medications Prior to Visit  Medication Sig Dispense Refill  . acetaminophen (TYLENOL) 500 MG tablet Take 500 mg by mouth every 6 (six) hours as needed.    Marland Kitchen alendronate (FOSAMAX) 70 MG tablet TAKE 1 TABLET BY MOUTH ONCE A WEEK ON AN EMPTY STOMACH AND WITH A FULL GLASS OF WATER 12 tablet 0  . aspirin 81 MG tablet Take 81 mg by mouth daily.    . celecoxib (CELEBREX) 100 MG capsule TAKE 1 CAPSULE(100 MG) BY MOUTH TWICE DAILY 180 capsule 1  . hydrocortisone  (ANUSOL-HC) 25 MG suppository Place 1 suppository (25 mg total) rectally 2 (two) times daily. 12 suppository 0  . losartan (COZAAR) 50 MG tablet Take 1 tablet by mouth once daily 90 tablet 3  . Multiple Vitamins-Minerals (ICAPS AREDS 2 PO) Take by mouth.    . Olopatadine HCl 0.2 % SOLN Apply 1 drop to eye daily. 2.5 mL 2  . Selenium Sulfide 2.25 % SHAM Apply 1 application topically once a week. 1 Bottle 1  . Vitamin D, Ergocalciferol, (DRISDOL) 1.25 MG (50000 UNIT) CAPS capsule TAKE 1 CAPSULE BY MOUTH EVERY 7 DAYS 8 capsule 0  . albuterol (PROVENTIL HFA;VENTOLIN HFA) 108 (90 Base) MCG/ACT inhaler Inhale 2 puffs into the lungs every 6 (six) hours as needed for wheezing or shortness of breath. (Patient not taking: Reported on 02/22/2020) 1 Inhaler 2  . cetirizine (ZYRTEC) 10 MG tablet Take 1 tablet (10 mg total) by mouth daily. (Patient not taking: Reported on 02/22/2020) 30 tablet 11   No facility-administered medications prior to visit.    No Known Allergies  ROS Review of Systems  Constitutional: Negative.   HENT: Negative.   Eyes: Negative.  Negative for blurred vision.  Respiratory: Negative for shortness of breath.   Cardiovascular: Negative for chest pain, palpitations and orthopnea.  Gastrointestinal: Negative.   Endocrine: Negative.   Genitourinary: Negative.   Musculoskeletal: Positive for arthralgias. Negative for neck pain.  Neurological: Negative for headaches.  Hematological: Negative.   Psychiatric/Behavioral: Negative.       Objective:    Physical Exam  Constitutional: She is oriented to person, place, and time. She appears well-developed and well-nourished.  HENT:  Head: Normocephalic and atraumatic.  Eyes: Pupils are equal, round, and reactive to light.  Pulmonary/Chest: Effort normal and breath sounds normal.  Abdominal: Soft.  Musculoskeletal:        General: Normal range of motion.     Cervical back: Normal range of motion.  Neurological: She is alert and  oriented to person, place, and time.  Skin: Skin is warm.  Psychiatric: She has a normal mood and affect. Her behavior is normal. Judgment and thought content normal.    BP 132/64 (BP Location: Left Arm, Patient Position: Sitting, Cuff Size: Large) Comment: manually  Pulse 73   Temp 98.6 F (37 C) (Oral)   Resp 16   Ht 4\' 8"  (1.422 m)   Wt 167 lb (75.8 kg)   SpO2 97%   BMI 37.44 kg/m  Wt Readings from Last 3 Encounters:  02/22/20 167 lb (75.8 kg)  12/14/19 170 lb (77.1 kg)  12/03/18 158 lb (71.7 kg)     Health Maintenance Due  Topic Date Due  . Hepatitis C Screening  Never done  . COLONOSCOPY  Never done  . PNA vac Low Risk Adult (2 of 2 - PPSV23) 12/11/2018  There are no preventive care reminders to display for this patient.  No results found for: TSH Lab Results  Component Value Date   WBC 6.8 04/10/2016   HGB 11.8 04/10/2016   HCT 36.9 04/10/2016   MCV 81.5 04/10/2016   PLT 238 04/10/2016   Lab Results  Component Value Date   NA 141 12/14/2019   K 4.2 12/14/2019   CO2 23 12/14/2019   GLUCOSE 81 12/14/2019   BUN 10 12/14/2019   CREATININE 0.77 12/14/2019   BILITOT 0.2 12/14/2019   ALKPHOS 98 12/14/2019   AST 43 (H) 12/14/2019   ALT 42 (H) 12/14/2019   PROT 7.3 12/14/2019   ALBUMIN 4.1 12/14/2019   CALCIUM 8.6 (L) 12/14/2019   Lab Results  Component Value Date   CHOL 138 12/14/2019   Lab Results  Component Value Date   HDL 47 12/14/2019   Lab Results  Component Value Date   LDLCALC 54 12/14/2019   Lab Results  Component Value Date   TRIG 229 (H) 12/14/2019   Lab Results  Component Value Date   CHOLHDL 2.9 12/14/2019   Lab Results  Component Value Date   HGBA1C 5.8 (A) 02/22/2020      Assessment & Plan:   Problem List Items Addressed This Visit      Cardiovascular and Mediastinum   Essential hypertension - Primary   Relevant Orders   Urinalysis Dipstick     Other   Prediabetes   Relevant Orders   HgB A1c (Completed)        Essential hypertension BP 132/64 (BP Location: Left Arm, Patient Position: Sitting, Cuff Size: Large) Comment: manually  Pulse 73   Temp 98.6 F (37 C) (Oral)   Resp 16   Ht 4\' 8"  (1.422 m)   Wt 167 lb (75.8 kg)   SpO2 97%   BMI 37.44 kg/m  - Continue medication, monitor blood pressure at home. Continue DASH diet. Reminder to go to the ER if any CP, SOB, nausea, dizziness, severe HA, changes vision/speech, left arm numbness and tingling and jaw pain. - Urinalysis Dipstick - Comprehensive metabolic panel  Prediabetes Hemoglobin a1C is 6.1, which is increased from previous. Discussed diet and exercise at length.  The patient is asked to make an attempt to improve diet and exercise patterns to aid in medical management of this problem. - HgB A1c  B12 deficiency - Vitamin B12   Language barrier to communication Utilizing video interpreter to assist with communication  Abnormal urinalysis - Urine Culture  Follow-up: Return in about 6 months (around 08/23/2020) for hypertension.   10/23/2020  APRN, MSN, FNP-C Patient Care [facility] [address] [place], [place] [phone]

## 2020-02-23 LAB — COMPREHENSIVE METABOLIC PANEL
ALT: 29 IU/L (ref 0–32)
AST: 33 IU/L (ref 0–40)
Albumin/Globulin Ratio: 1.7 (ref 1.2–2.2)
Albumin: 4.5 g/dL (ref 3.7–4.7)
Alkaline Phosphatase: 94 IU/L (ref 39–117)
BUN/Creatinine Ratio: 21 (ref 12–28)
BUN: 13 mg/dL (ref 8–27)
Bilirubin Total: 0.4 mg/dL (ref 0.0–1.2)
CO2: 23 mmol/L (ref 20–29)
Calcium: 9.1 mg/dL (ref 8.7–10.3)
Chloride: 106 mmol/L (ref 96–106)
Creatinine, Ser: 0.63 mg/dL (ref 0.57–1.00)
GFR calc Af Amer: 104 mL/min/{1.73_m2} (ref >59)
GFR calc non Af Amer: 90 mL/min/{1.73_m2} (ref >59)
Globulin, Total: 2.7 g/dL (ref 1.5–4.5)
Glucose: 104 mg/dL — ABNORMAL HIGH (ref 65–99)
Potassium: 3.9 mmol/L (ref 3.5–5.2)
Sodium: 142 mmol/L (ref 134–144)
Total Protein: 7.2 g/dL (ref 6.0–8.5)

## 2020-02-23 LAB — VITAMIN B12: Vitamin B-12: 376 pg/mL (ref 232–1245)

## 2020-02-24 ENCOUNTER — Telehealth: Payer: Self-pay

## 2020-02-24 LAB — URINE CULTURE: Organism ID, Bacteria: NO GROWTH

## 2020-02-24 NOTE — Telephone Encounter (Signed)
Called, no answer. Left a voicemail to call back. Thanks!  

## 2020-02-24 NOTE — Telephone Encounter (Signed)
-----   Message from Massie Maroon, [place] sent at 02/24/2020 11:03 AM EDT ----- Regarding: lab results Please inform patient that all laboratory values are within a normal range.  No medication changes are warranted at this time.  Continue to follow a low-fat, low carbohydrate diet.  And increase daily physical activities in order to achieve positive outcomes.  Patient to follow-up in clinic as scheduled.Nolon Nations  APRN, MSN, FNP-C Patient Care [facility] [address] [place], [place] [address] [phone]

## 2020-02-24 NOTE — Telephone Encounter (Signed)
Patient with Elizabeth Jennings who is on hippa) returned call. I advised that labs are within normal limits and no medication needs to be changed at this time. Advised that she eat a low fat low carb diet and increased physical activity as tolerated. Patient verbalized understanding. Thanks!

## 2020-03-20 ENCOUNTER — Other Ambulatory Visit: Payer: Self-pay

## 2020-03-20 MED ORDER — VITAMIN D (ERGOCALCIFEROL) 1.25 MG (50000 UNIT) PO CAPS: ORAL_CAPSULE | ORAL | 0 refills | Status: DC

## 2020-08-22 ENCOUNTER — Ambulatory Visit: Payer: Medicare Other | Admitting: Family Medicine

## 2020-09-22 ENCOUNTER — Other Ambulatory Visit: Payer: Self-pay | Admitting: Family Medicine

## 2020-09-22 DIAGNOSIS — Z1231 Encounter for screening mammogram for malignant neoplasm of breast: Secondary | ICD-10-CM

## 2020-11-02 ENCOUNTER — Ambulatory Visit: Payer: Medicare Other

## 2020-11-02 DIAGNOSIS — Z961 Presence of intraocular lens: Secondary | ICD-10-CM | POA: Diagnosis not present

## 2020-11-02 DIAGNOSIS — H26493 Other secondary cataract, bilateral: Secondary | ICD-10-CM | POA: Diagnosis not present

## 2020-11-02 DIAGNOSIS — H353132 Nonexudative age-related macular degeneration, bilateral, intermediate dry stage: Secondary | ICD-10-CM | POA: Diagnosis not present

## 2020-11-02 DIAGNOSIS — H04123 Dry eye syndrome of bilateral lacrimal glands: Secondary | ICD-10-CM | POA: Diagnosis not present

## 2020-11-25 ENCOUNTER — Emergency Department (HOSPITAL_COMMUNITY)
Admission: EM | Admit: 2020-11-25 | Discharge: 2020-11-26 | Disposition: A | Discharge status: Home / Self Care | Payer: Medicare Other | Attending: Emergency Medicine | Admitting: Emergency Medicine

## 2020-11-25 DIAGNOSIS — Z7982 Long term (current) use of aspirin: Secondary | ICD-10-CM | POA: Diagnosis not present

## 2020-11-25 DIAGNOSIS — I10 Essential (primary) hypertension: Secondary | ICD-10-CM | POA: Insufficient documentation

## 2020-11-25 DIAGNOSIS — I6782 Cerebral ischemia: Secondary | ICD-10-CM | POA: Diagnosis not present

## 2020-11-25 DIAGNOSIS — G9389 Other specified disorders of brain: Secondary | ICD-10-CM | POA: Diagnosis not present

## 2020-11-25 DIAGNOSIS — R200 Anesthesia of skin: Secondary | ICD-10-CM | POA: Diagnosis not present

## 2020-11-25 DIAGNOSIS — R29810 Facial weakness: Secondary | ICD-10-CM

## 2020-11-25 DIAGNOSIS — M6281 Muscle weakness (generalized): Secondary | ICD-10-CM | POA: Diagnosis not present

## 2020-11-26 ENCOUNTER — Emergency Department (HOSPITAL_COMMUNITY): Payer: Medicare Other

## 2020-11-26 ENCOUNTER — Other Ambulatory Visit: Payer: Self-pay

## 2020-11-26 DIAGNOSIS — G9389 Other specified disorders of brain: Secondary | ICD-10-CM | POA: Diagnosis not present

## 2020-11-26 DIAGNOSIS — I6782 Cerebral ischemia: Secondary | ICD-10-CM | POA: Diagnosis not present

## 2020-11-26 DIAGNOSIS — R29810 Facial weakness: Secondary | ICD-10-CM | POA: Diagnosis not present

## 2020-11-26 DIAGNOSIS — R200 Anesthesia of skin: Secondary | ICD-10-CM | POA: Diagnosis not present

## 2020-11-26 LAB — COMPREHENSIVE METABOLIC PANEL
ALT: 41 U/L (ref 0–44)
AST: 45 U/L — ABNORMAL HIGH (ref 15–41)
Albumin: 4 g/dL (ref 3.5–5.0)
Alkaline Phosphatase: 97 U/L (ref 38–126)
Anion gap: 10 (ref 5–15)
BUN: 24 mg/dL — ABNORMAL HIGH (ref 8–23)
CO2: 25 mmol/L (ref 22–32)
Calcium: 9.8 mg/dL (ref 8.9–10.3)
Chloride: 103 mmol/L (ref 98–111)
Creatinine, Ser: 0.84 mg/dL (ref 0.44–1.00)
GFR, Estimated: >60 mL/min (ref >60)
Glucose, Bld: 119 mg/dL — ABNORMAL HIGH (ref 70–99)
Potassium: 4.3 mmol/L (ref 3.5–5.1)
Sodium: 138 mmol/L (ref 135–145)
Total Bilirubin: 0.6 mg/dL (ref 0.3–1.2)
Total Protein: 7.3 g/dL (ref 6.5–8.1)

## 2020-11-26 LAB — CBC
HCT: 44.1 % (ref 36.0–46.0)
Hemoglobin: 14.3 g/dL (ref 12.0–15.0)
MCH: 31.6 pg (ref 26.0–34.0)
MCHC: 32.4 g/dL (ref 30.0–36.0)
MCV: 97.4 fL (ref 80.0–100.0)
Platelets: 207 10*3/uL (ref 150–400)
RBC: 4.53 MIL/uL (ref 3.87–5.11)
RDW: 13.2 % (ref 11.5–15.5)
WBC: 6.9 10*3/uL (ref 4.0–10.5)
nRBC: 0 % (ref 0.0–0.2)

## 2020-11-26 LAB — DIFFERENTIAL
Abs Immature Granulocytes: 0.03 10*3/uL (ref 0.00–0.07)
Basophils Absolute: 0 10*3/uL (ref 0.0–0.1)
Basophils Relative: 1 %
Eosinophils Absolute: 0.1 10*3/uL (ref 0.0–0.5)
Eosinophils Relative: 2 %
Immature Granulocytes: 0 %
Lymphocytes Relative: 39 %
Lymphs Abs: 2.7 10*3/uL (ref 0.7–4.0)
Monocytes Absolute: 0.6 10*3/uL (ref 0.1–1.0)
Monocytes Relative: 8 %
Neutro Abs: 3.4 10*3/uL (ref 1.7–7.7)
Neutrophils Relative %: 50 %

## 2020-11-26 LAB — I-STAT CHEM 8, ED
BUN: 30 mg/dL — ABNORMAL HIGH (ref 8–23)
Calcium, Ion: 1.25 mmol/L (ref 1.15–1.40)
Chloride: 103 mmol/L (ref 98–111)
Creatinine, Ser: 0.8 mg/dL (ref 0.44–1.00)
Glucose, Bld: 111 mg/dL — ABNORMAL HIGH (ref 70–99)
HCT: 44 % (ref 36.0–46.0)
Hemoglobin: 15 g/dL (ref 12.0–15.0)
Potassium: 4.5 mmol/L (ref 3.5–5.1)
Sodium: 140 mmol/L (ref 135–145)
TCO2: 27 mmol/L (ref 22–32)

## 2020-11-26 LAB — APTT: aPTT: 29 seconds (ref 24–36)

## 2020-11-26 LAB — PROTIME-INR
INR: 0.9 (ref 0.8–1.2)
Prothrombin Time: 12.1 seconds (ref 11.4–15.2)

## 2020-11-26 MED ORDER — SODIUM CHLORIDE 0.9% FLUSH
3.0000 mL | Freq: Once | INTRAVENOUS | Status: DC
Start: 2020-11-26 — End: 2020-11-26

## 2020-11-26 MED ORDER — ARTIFICIAL TEARS OPHTHALMIC OINT: TOPICAL_OINTMENT | Freq: Every evening | OPHTHALMIC | 1 refills | Status: DC | PRN

## 2020-11-26 MED ORDER — PREDNISONE 10 MG PO TABS: ORAL_TABLET | ORAL | 0 refills | Status: AC

## 2020-11-26 MED ORDER — VALACYCLOVIR HCL 1 G PO TABS: 1000.0000 mg | ORAL_TABLET | Freq: Three times a day (TID) | ORAL | 0 refills | Status: AC

## 2020-11-26 NOTE — Discharge Instructions (Addendum)
°  The MRI showed no evidence of stroke.  Take the prednisone as follows: 60 mg daily for 5 days, followed by 50 mg on day 6, 40 mg on day 7, 30 mg on day 8, 20 mg on day 9, 10 mg on day 10.  Valacyclovir: This is an antiviral medication that should be taken because some instances of facial droop like this are caused by viruses, such as the virus that causes chickenpox and shingles.  Neurology: Recommend follow-up with the neurologist on this matter.  Call to make an appointment.  Ophthalmology: Follow-up with the eye doctor to make sure your left eye stays healthy.   La resonancia magntica no mostr evidencia de accidente cerebrovascular.  Tome la prednisona de la siguiente manera: 60 mg al da durante 5 das, luego 50 mg el da 6, 40 mg el da 7, 30 mg el da 8, 20 mg el da 9, 10 mg el da 10.  Valaciclovir: este es un medicamento antiviral que debe tomarse porque algunos casos de cada facial como este son causados por virus, como el virus que causa la varicela y el herpes zster.  Neurologa: Recomendar seguimiento con el neurlogo sobre este asunto. Llame para hacer una cita.  Oftalmologa: Haga un seguimiento con el oftalmlogo para asegurarse de que su ojo izquierdo se mantenga saludable.

## 2020-11-26 NOTE — ED Triage Notes (Signed)
Pt presents to ED POV. Pt c/o L sided facial and arm numbness pt reports that she noticed this morning at 0700. Pt reports that she is complaint w/ medication. Pt had droop to L eye and mount. Pt also has redness to L lip and eye

## 2020-11-26 NOTE — ED Provider Notes (Signed)
[facility] Provider Note   CSN: [id number] Arrival date & time: 11/25/20  2352     History Chief Complaint  Patient presents with  . Facial Droop    Elizabeth Jennings is a 75 y.o. female.  The history is provided by the patient. A language interpreter was used (SpanishCarley Hammed: Eva [phone]).        Elizabeth Jennings is a 75 y.o. female, with a history of hyperlipidemia, HTN, presenting to the ED with left-sided facial droop she states began evening of January 7 prior to going to sleep.  When she awoke around 7 AM yesterday morning, she noticed worsened left-sided facial droop, but now accompanied by weakness in the left arm and leg as well as numbness in these extremities.  She states she had an episode of similar symptoms several years ago in [place] that ended up resolving after about 2 months. She is Covid vaccinated. Denies fever/chills, neck/back pain, falls/trauma, chest pain, shortness of breath, headache, vision loss, difficulty swallowing, or any other complaints.     Past Medical History:  Diagnosis Date  . Hemorrhoids   . Hyperlipidemia   . Hypertension   . Osteoporosis   . Prediabetes     Patient Active Problem List   Diagnosis Date Noted  . Prediabetes 04/11/2016  . Vitamin D deficiency 04/11/2016  . Essential hypertension 04/10/2016  . Arthritis 04/10/2016  . Osteoporosis 04/10/2016  . Obesity 04/10/2016  . Hyperglycemia 04/10/2016  . Environmental allergies 04/10/2016  . Language barrier to communication 04/10/2016    Past Surgical History:  Procedure Laterality Date  . EYE SURGERY  2017   cataracts both eyes   . EYE SURGERY  2018   eye lid surgery      OB History   No obstetric history on file.     No family history on file.  Social History   Tobacco Use  . Smoking status: Never Smoker  . Smokeless tobacco: Never Used  Vaping Use  . Vaping Use: Never used  Substance Use Topics  . Alcohol use: No  .  Drug use: No    Home Medications Prior to Admission medications   Medication Sig Start Date End Date Taking? Authorizing Provider  artificial tears (LACRILUBE) OINT ophthalmic ointment Place into the left eye at bedtime as needed for dry eyes. 11/26/20  Yes Tobias Avitabile C, PA-C  predniSONE (DELTASONE) 10 MG tablet Take 6 tablets (60 mg total) by mouth daily for 5 days, THEN 5 tablets (50 mg total) daily for 1 day, THEN 4 tablets (40 mg total) daily for 1 day, THEN 3 tablets (30 mg total) daily for 1 day, THEN 2 tablets (20 mg total) daily for 1 day, THEN 1 tablet (10 mg total) daily for 1 day. 11/26/20 12/06/20 Yes Opaline Reyburn C, PA-C  valACYclovir (VALTREX) 1000 MG tablet Take 1 tablet (1,000 mg total) by mouth 3 (three) times daily for 7 days. 11/26/20 12/03/20 Yes Govani Radloff C, PA-C  acetaminophen (TYLENOL) 500 MG tablet Take 500 mg by mouth every 6 (six) hours as needed.    [provider]  albuterol (PROVENTIL HFA;VENTOLIN HFA) 108 (90 Base) MCG/ACT inhaler Inhale 2 puffs into the lungs every 6 (six) hours as needed for wheezing or shortness of breath. Patient not taking: Reported on 02/22/2020 12/03/18   Mike Gipouglas, Andre, FNP  alendronate (FOSAMAX) 70 MG tablet TAKE 1 TABLET BY MOUTH ONCE A WEEK ON AN EMPTY STOMACH AND WITH A FULL GLASS OF  WATER 12/03/18   Mike Gip, FNP  aspirin 81 MG tablet Take 81 mg by mouth daily.    [provider]  celecoxib (CELEBREX) 100 MG capsule TAKE 1 CAPSULE(100 MG) BY MOUTH TWICE DAILY 10/01/19   Quentin Angst, MD  cetirizine (ZYRTEC) 10 MG tablet Take 1 tablet (10 mg total) by mouth daily. Patient not taking: Reported on 02/22/2020 12/13/16   Massie Maroon, FNP  hydrocortisone (ANUSOL-HC) 25 MG suppository Place 1 suppository (25 mg total) rectally 2 (two) times daily. 12/14/19   Massie Maroon, FNP  losartan (COZAAR) 50 MG tablet Take 1 tablet by mouth once daily 02/16/20   Barbette Merino, NP  Multiple Vitamins-Minerals (ICAPS AREDS 2 PO)  Take by mouth.    [provider]  Olopatadine HCl 0.2 % SOLN Apply 1 drop to eye daily. 12/14/19   Massie Maroon, FNP  Selenium Sulfide 2.25 % SHAM Apply 1 application topically once a week. 12/11/17   Massie Maroon, FNP  Vitamin D, Ergocalciferol, (DRISDOL) 1.25 MG (50000 UNIT) CAPS capsule TAKE 1 CAPSULE BY MOUTH EVERY 7 DAYS 03/20/20   Massie Maroon, FNP    Allergies    Patient has no known allergies.  Review of Systems   Review of Systems  Constitutional: Negative for chills and fever.  Respiratory: Negative for cough, choking and shortness of breath.   Cardiovascular: Negative for chest pain.  Gastrointestinal: Negative for abdominal pain, diarrhea, nausea and vomiting.  Genitourinary: Negative for dysuria.  Musculoskeletal: Negative for back pain and neck pain.  Neurological: Positive for facial asymmetry, weakness and numbness. Negative for dizziness, seizures, syncope, speech difficulty and headaches.  All other systems reviewed and are negative.   Physical Exam Updated Vital Signs BP (!) 155/65 (BP Location: Left Arm)   Pulse 62   Temp 98.4 F (36.9 C) (Oral)   Resp 18   SpO2 94%   Physical Exam Vitals and nursing note reviewed.  Constitutional:      General: She is not in acute distress.    Appearance: She is well-developed. She is not diaphoretic.  HENT:     Head: Normocephalic and atraumatic.     Mouth/Throat:     Mouth: Mucous membranes are moist.     Pharynx: Oropharynx is clear.  Eyes:     Extraocular Movements: Extraocular movements intact.     Conjunctiva/sclera: Conjunctivae normal.     Pupils: Pupils are equal, round, and reactive to light.  Cardiovascular:     Rate and Rhythm: Normal rate and regular rhythm.     Pulses: Normal pulses.          Radial pulses are 2+ on the right side and 2+ on the left side.       Posterior tibial pulses are 2+ on the right side and 2+ on the left side.     Heart sounds: Normal heart sounds.      Comments: Tactile temperature in the extremities appropriate and equal bilaterally. Pulmonary:     Effort: Pulmonary effort is normal. No respiratory distress.     Breath sounds: Normal breath sounds.  Abdominal:     Palpations: Abdomen is soft.     Tenderness: There is no abdominal tenderness. There is no guarding.  Musculoskeletal:     Cervical back: Neck supple.     Right lower leg: No edema.     Left lower leg: No edema.  Lymphadenopathy:     Cervical: No cervical adenopathy.  Skin:  General: Skin is warm and dry.  Neurological:     Mental Status: She is alert and oriented to person, place, and time.     Comments: Facial droop on the left side.  Facial motor weakness noted to involve the left lower face, forehead, and eyelid. Question of slight tongue deviation towards the left. Handles oral secretions without noted difficulty. No definite slurred speech, however, difficult to determine due to language differences. No arm drift noted. Grip strength equal bilaterally. Patient endorses decreased sensation to light touch in the left arm and leg. Strength 5/5 in the bilateral biceps/triceps. Strength 3/5, equal bilaterally in the bilateral lower extremities at the hips.  Psychiatric:        Mood and Affect: Mood and affect normal.        Speech: Speech normal.        Behavior: Behavior normal.     ED Results / Procedures / Treatments   Labs (all labs ordered are listed, but only abnormal results are displayed) Labs Reviewed  COMPREHENSIVE METABOLIC PANEL - Abnormal; Notable for the following components:      Result Value   Glucose, Bld 119 (*)    BUN 24 (*)    AST 45 (*)    All other components within normal limits  I-STAT CHEM 8, ED - Abnormal; Notable for the following components:   BUN 30 (*)    Glucose, Bld 111 (*)    All other components within normal limits  PROTIME-INR  APTT  CBC  DIFFERENTIAL  CBG MONITORING, ED    EKG EKG  Interpretation  Date/Time:  Sunday November 26 2020 00:29:39 EST Ventricular Rate:  76 PR Interval:  164 QRS Duration: 86 QT Interval:  394 QTC Calculation: 443 R Axis:   -46 Text Interpretation: Normal sinus rhythm Left axis deviation Anterior infarct , age undetermined Abnormal ECG When compared with ECG of 10/15/2006, No significant change was found Confirmed by Dione BoozeGlick, David ([id number]) on 11/26/2020 1:11:31 AM   Radiology CT HEAD WO CONTRAST  Result Date: 11/26/2020 CLINICAL DATA:  Left facial and arm numbness which began at 07:00, left sided facial droop. EXAM: CT HEAD WITHOUT CONTRAST TECHNIQUE: Contiguous axial images were obtained from the base of the skull through the vertex without intravenous contrast. COMPARISON:  CT 02/09/2012 FINDINGS: Brain: No CT evident areas of large vascular territory or cortically based infarction with preservation of the gray-white differentiation. No evidence of acute hemorrhage, hydrocephalus, extra-axial collection, visible mass lesion or mass effect. Symmetric prominence of the ventricles, cisterns and sulci compatible with parenchymal volume loss. Patchy areas of white matter hypoattenuation are most compatible with chronic microvascular angiopathy. Vascular: Atherosclerotic calcification of the carotid siphons and intradural vertebral arteries. No hyperdense vessel. Skull: No calvarial fracture or suspicious osseous lesion. No scalp swelling or hematoma. Sinuses/Orbits: Minimal mural thickening in the ethmoids and maxillary sinuses. Remaining paranasal sinuses are predominantly clear. No pneumatized secretions or layering air-fluid levels. Orbital structures are unremarkable aside from prior lens extractions. Other: Debris in the bilateral external auditory canals. IMPRESSION: 1. No CT evidence of large vascular territory or cortically based infarction. If there is persisting clinical concern for infarct, MRI is more sensitive and specific for early changes of  ischemia. 2. 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 3. Debris in the bilateral external auditory canals, correlate for cerumen impaction. Electronically Signed   By: Kreg ShropshirePrice  DeHay M.D.   On: 11/26/2020 01:03   MR BRAIN WO CONTRAST  Result Date: 11/26/2020 CLINICAL DATA:  Left facial droop, arm numbness EXAM: MRI HEAD WITHOUT CONTRAST TECHNIQUE: Multiplanar, multiecho pulse sequences of the brain and surrounding structures were obtained without intravenous contrast. COMPARISON:  None. FINDINGS: Brain: There is no acute infarction or intracranial hemorrhage. There is no intracranial mass, mass effect, or edema. There is no hydrocephalus or extra-axial fluid collection. Ventricles and sulci are normal in size and configuration. Patchy foci of T2 hyperintensity in the supratentorial white matter are nonspecific but may reflect mild chronic microvascular ischemic changes. Vascular: Major vessel flow voids at the skull base are preserved. Skull and upper cervical spine: Normal marrow signal is preserved. Sinuses/Orbits: Mild mucosal thickening. Bilateral lens replacements. Other: Sella is unremarkable.  Mastoid air cells are clear. IMPRESSION: No evidence of recent infarction, hemorrhage, or mass. Mild chronic microvascular ischemic changes. Electronically Signed   By: Guadlupe Spanish M.D.   On: 11/26/2020 13:09    Procedures Procedures (including critical care time)  Medications Ordered in ED Medications  sodium chloride flush (NS) 0.9 % injection 3 mL (has no administration in time range)    ED Course  I have reviewed the triage vital signs and the nursing notes.  Pertinent labs & imaging results that were available during my care of the patient were reviewed by me and considered in my medical decision making (see chart for details).  Clinical Course as of 11/26/20 1415  Wynelle Link Nov 26, 2020  1019 75 yo spanish speaking female  (interpreter [place] used) presenting to ED with left sided facial droop and paresthesia, and left arm and legs paresthesias since Friday night/Saturday morning.  She has hx of HTN, denies HLD or diabetes or smoking.  Possible hx of stroke in the past (?) while living in [place] 20 years ago.  Labs largely unremarkable.  CTH unremarkable.  On exam she has left sided facial droop that appears to involve the forehead, which would be consistent with a Bell's palsy, however I cannot explain her left body paresthesias.  NO objective strength deficits of the arms or legs.  No pronator drift.  PA provider to discuss further imaging options with neurology team, and will reassess. [MT]  1019 Spoke with Dr. Derry Lory, neurologist. We discussed the patient's reported symptoms as well as physical exam findings. He states the reported symptoms are worrisome for a stroke, particularly a brainstem stroke. Recommends MR brain without contrast with particular attention paid to the brainstem on imaging. If positive for stroke, he recommends admitting the patient and they will come evaluate the patient.  If negative, recommends discharge with outpatient neurology follow-up. [SJ]  1358 No acute finding suggest stroke on MRI of the brain.  Once again I think this is most consistent with a peripheral neuropathy, particularly given her forehead involvement.  We can start her on prednisone and have her follow-up with neurology. [MT]    Clinical Course User Index [MT] Trifan, Kermit Balo, MD [SJ] Concepcion Living   MDM Rules/Calculators/A&P                          Patient presents with left-sided facial droop, reproducible on exam. Objective findings suggest possible Bell's palsy, however, due to the patient's subjective complaints involving the extremities, further evaluation is necessary.  I personally reviewed and interpreted the patient's labs and imaging studies. CT and MRI unremarkable for acute  abnormalities. We will treat as Bell's palsy and encourage close neurology follow-up. The patient was  given instructions for home care as well as return precautions. Patient voices understanding of these instructions, accepts the plan, and is comfortable with discharge.  Findings and plan of care discussed with attending physician, Marguarite Arbour, MD. Dr. Renaye Rakers personally evaluated and examined this patient.  Vitals:   11/26/20 0653 11/26/20 0831 11/26/20 1100 11/26/20 1335  BP: (!) 152/76 (!) 155/65 (!) 165/79 (!) 140/59  Pulse: (!) 58 62 67 (!) 59  Resp: 17 18 19 16   Temp:  98.4 F (36.9 C)    TempSrc:  Oral    SpO2: 97% 94% 95% 96%     Final Clinical Impression(s) / ED Diagnoses Final diagnoses:  Facial droop    Rx / DC Orders ED Discharge Orders         Ordered    predniSONE (DELTASONE) 10 MG tablet        11/26/20 1401    valACYclovir (VALTREX) 1000 MG tablet  3 times daily        11/26/20 1401    Ambulatory referral to Neurology       Comments: An appointment is requested in approximately: 1 week   11/26/20 1402    artificial tears (LACRILUBE) OINT ophthalmic ointment  At bedtime PRN        11/26/20 1406           01/24/21, PA-C 11/26/20 1418    01/24/21, MD 11/26/20 1708

## 2020-11-26 NOTE — ED Notes (Signed)
Patient Alert and oriented to baseline. Stable and ambulatory to baseline. Patient verbalized understanding of the discharge instructions.  Patient belongings were taken by the patient.   

## 2020-11-26 NOTE — ED Notes (Signed)
Pt transported to MRI 

## 2020-11-28 ENCOUNTER — Telehealth: Payer: Self-pay | Admitting: Family Medicine

## 2020-11-28 ENCOUNTER — Ambulatory Visit: Payer: Medicare Other | Admitting: Family Medicine

## 2020-11-30 NOTE — Telephone Encounter (Signed)
Done

## 2020-12-04 NOTE — Progress Notes (Deleted)
NEUROLOGY CONSULTATION NOTE  Elizabeth Jennings MRN: [id number] DOB: December 15, 1945  Referring provider: Harolyn Rutherford, PA-C (ED referral) Primary care provider: Julianne Handler, FNP  Reason for consult:  Facial droop   Subjective:  Elizabeth Jennings is a 75 year old ***-handed female with HTN, HLD and prediabetes who presents for facial droop.  ED note reviewed.  Spanish-speaking interpreter present.  On the night of 11/25/2020, ***.  The next day, she went to the ED.  CT and subsequent MRI of brain were personally reviewed and showed mild chronic small vessel ischemic changes in the cerebral white matter but negative for acute findings.  She was diagnosed with Bell's palsy but no explanation for left sided paresthesias.  She was discharged on prednisone ***   CBC and CMP reviewed and were unremarkable except for mildly elevated glucose 119, borderline elevated BUN 24 and AST of 45.  3    PAST MEDICAL HISTORY: Past Medical History:  Diagnosis Date  . Hemorrhoids   . Hyperlipidemia   . Hypertension   . Osteoporosis   . Prediabetes     PAST SURGICAL HISTORY: Past Surgical History:  Procedure Laterality Date  . EYE SURGERY  2017   cataracts both eyes   . EYE SURGERY  2018   eye lid surgery     MEDICATIONS: Current Outpatient Medications on File Prior to Visit  Medication Sig Dispense Refill  . acetaminophen (TYLENOL) 500 MG tablet Take 500 mg by mouth every 6 (six) hours as needed.    Marland Kitchen albuterol (PROVENTIL HFA;VENTOLIN HFA) 108 (90 Base) MCG/ACT inhaler Inhale 2 puffs into the lungs every 6 (six) hours as needed for wheezing or shortness of breath. (Patient not taking: Reported on 02/22/2020) 1 Inhaler 2  . alendronate (FOSAMAX) 70 MG tablet TAKE 1 TABLET BY MOUTH ONCE A WEEK ON AN EMPTY STOMACH AND WITH A FULL GLASS OF WATER 12 tablet 0  . artificial tears (LACRILUBE) OINT ophthalmic ointment Place into the left eye at bedtime as needed for dry eyes. 3.5 g 1  . aspirin 81 MG tablet Take  81 mg by mouth daily.    . celecoxib (CELEBREX) 100 MG capsule TAKE 1 CAPSULE(100 MG) BY MOUTH TWICE DAILY 180 capsule 1  . cetirizine (ZYRTEC) 10 MG tablet Take 1 tablet (10 mg total) by mouth daily. (Patient not taking: Reported on 02/22/2020) 30 tablet 11  . hydrocortisone (ANUSOL-HC) 25 MG suppository Place 1 suppository (25 mg total) rectally 2 (two) times daily. 12 suppository 0  . losartan (COZAAR) 50 MG tablet Take 1 tablet by mouth once daily 90 tablet 3  . Multiple Vitamins-Minerals (ICAPS AREDS 2 PO) Take by mouth.    . Olopatadine HCl 0.2 % SOLN Apply 1 drop to eye daily. 2.5 mL 2  . predniSONE (DELTASONE) 10 MG tablet Take 6 tablets (60 mg total) by mouth daily for 5 days, THEN 5 tablets (50 mg total) daily for 1 day, THEN 4 tablets (40 mg total) daily for 1 day, THEN 3 tablets (30 mg total) daily for 1 day, THEN 2 tablets (20 mg total) daily for 1 day, THEN 1 tablet (10 mg total) daily for 1 day. 45 tablet 0  . Selenium Sulfide 2.25 % SHAM Apply 1 application topically once a week. 1 Bottle 1  . Vitamin D, Ergocalciferol, (DRISDOL) 1.25 MG (50000 UNIT) CAPS capsule TAKE 1 CAPSULE BY MOUTH EVERY 7 DAYS 8 capsule 0   No current facility-administered medications on file prior to visit.  ALLERGIES: No Known Allergies  FAMILY HISTORY: ***  SOCIAL HISTORY: Social History   Socioeconomic History  . Marital status: Divorced    Spouse name: Not on file  . Number of children: Not on file  . Years of education: Not on file  . Highest education level: Not on file  Occupational History  . Not on file  Tobacco Use  . Smoking status: Never Smoker  . Smokeless tobacco: Never Used  Vaping Use  . Vaping Use: Never used  Substance and Sexual Activity  . Alcohol use: No  . Drug use: No  . Sexual activity: Not on file  Other Topics Concern  . Not on file  Social History Narrative  . Not on file   Social Determinants of Health   Financial Resource Strain: Not on file  Food  Insecurity: Not on file  Transportation Needs: Not on file  Physical Activity: Not on file  Stress: Not on file  Social Connections: Not on file  Intimate Partner Violence: Not on file    Objective:  *** General: No acute distress.  Patient appears well-groomed.   Head:  Normocephalic/atraumatic Eyes:  fundi examined but not visualized Neck: supple, no paraspinal tenderness, full range of motion Back: No paraspinal tenderness Heart: regular rate and rhythm Lungs: Clear to auscultation bilaterally. Vascular: No carotid bruits. Neurological Exam: Mental status: alert and oriented to person, place, and time, recent and remote memory intact, fund of knowledge intact, attention and concentration intact, speech fluent and not dysarthric, language intact. Cranial nerves: CN I: not tested CN II: pupils equal, round and reactive to light, visual fields intact CN III, IV, VI:  full range of motion, no nystagmus, no ptosis CN V: facial sensation intact. CN VII: upper and lower face symmetric CN VIII: hearing intact CN IX, X: gag intact, uvula midline CN XI: sternocleidomastoid and trapezius muscles intact CN XII: tongue midline Bulk & Tone: normal, no fasciculations. Motor:  muscle strength 5/5 throughout Sensation:  Pinprick, temperature and vibratory sensation intact. Deep Tendon Reflexes:  2+ throughout,  toes downgoing.   Finger to nose testing:  Without dysmetria.   Heel to shin:  Without dysmetria.   Gait:  Normal station and stride.  Romberg negative.  Assessment/Plan:   ***    Thank you for allowing me to take part in the care of this patient.  Shon Millet, DO  CC: Julianne Handler, FNP

## 2020-12-05 ENCOUNTER — Ambulatory Visit: Payer: Medicare Other | Admitting: Family Medicine

## 2020-12-06 ENCOUNTER — Ambulatory Visit: Payer: Medicare Other | Admitting: Neurology

## 2020-12-12 ENCOUNTER — Ambulatory Visit: Payer: Medicare Other | Admitting: Family Medicine

## 2020-12-12 ENCOUNTER — Ambulatory Visit (INDEPENDENT_AMBULATORY_CARE_PROVIDER_SITE_OTHER): Payer: Medicare Other | Admitting: Family Medicine

## 2020-12-12 ENCOUNTER — Other Ambulatory Visit: Payer: Self-pay

## 2020-12-12 ENCOUNTER — Encounter: Payer: Self-pay | Admitting: Family Medicine

## 2020-12-12 VITALS — BP 156/76 | HR 66 | Temp 97.2°F | Ht <= 58 in | Wt 160.2 lb

## 2020-12-12 DIAGNOSIS — H02402 Unspecified ptosis of left eyelid: Secondary | ICD-10-CM

## 2020-12-12 DIAGNOSIS — R202 Paresthesia of skin: Secondary | ICD-10-CM | POA: Diagnosis not present

## 2020-12-12 DIAGNOSIS — Z789 Other specified health status: Secondary | ICD-10-CM

## 2020-12-12 DIAGNOSIS — G510 Bell's palsy: Secondary | ICD-10-CM

## 2020-12-12 MED ORDER — PREDNISONE 20 MG PO TABS: 60.0000 mg | ORAL_TABLET | Freq: Every day | ORAL | 0 refills | Status: DC

## 2020-12-12 MED ORDER — HYPROMELLOSE (GONIOSCOPIC) 2.5 % OP SOLN: 1.0000 [drp] | Freq: Three times a day (TID) | OPHTHALMIC | 12 refills | Status: DC | PRN

## 2020-12-12 MED ORDER — VALACYCLOVIR HCL 1 G PO TABS: 1000.0000 mg | ORAL_TABLET | Freq: Two times a day (BID) | ORAL | 0 refills | Status: DC

## 2020-12-12 MED ORDER — VALACYCLOVIR HCL 1 G PO TABS: 1000.0000 mg | ORAL_TABLET | Freq: Three times a day (TID) | ORAL | 0 refills | Status: DC

## 2020-12-12 NOTE — Progress Notes (Signed)
Established Patient Office Visit  Subjective:  Patient ID: Elizabeth Jennings, female    DOB: Nov 16, 1946  Age: 75 y.o. MRN: [id number]  CC: No chief complaint on file.   HPI Elizabeth Jennings is a 74 year old female with a medical history significant for environmental allergies, osteoporosis, essential hypertension, and vitamin D deficiency that presents with left facial drooping over the past 3 weeks.  Utilizing Spanish interpreter to assist with communication.  Patient reports a sudden drooping to left face and weakness to left side of the past several weeks.  She was treated and evaluated in the emergency department on 11/26/2020 for this problem.  Patient was treated with antivirals and steroids at the time.  At that time, stroke was ruled out.  Patient states that she had a similar episode many years ago that spontaneously dissipated.  Patient's brain MRI showed no evidence of recent infarction, hemorrhage, or mass.  There was mild chronic microvascular ischemic changes.  Patient denies any headache, dizziness, blurred vision, urinary symptoms, nausea, vomiting, or diarrhea.  Patient is followed closely by ophthalmology. Past Medical History:  Diagnosis Date  . Hemorrhoids   . Hyperlipidemia   . Hypertension   . Osteoporosis   . Prediabetes     Past Surgical History:  Procedure Laterality Date  . EYE SURGERY  2017   cataracts both eyes   . EYE SURGERY  2018   eye lid surgery     No family history on file.  Social History   Socioeconomic History  . Marital status: Divorced    Spouse name: Not on file  . Number of children: Not on file  . Years of education: Not on file  . Highest education level: Not on file  Occupational History  . Not on file  Tobacco Use  . Smoking status: Never Smoker  . Smokeless tobacco: Never Used  Vaping Use  . Vaping Use: Never used  Substance and Sexual Activity  . Alcohol use: No  . Drug use: No  . Sexual activity: Not on file  Other Topics  Concern  . Not on file  Social History Narrative  . Not on file   Social Determinants of Health   Financial Resource Strain: Not on file  Food Insecurity: Not on file  Transportation Needs: Not on file  Physical Activity: Not on file  Stress: Not on file  Social Connections: Not on file  Intimate Partner Violence: Not on file    Outpatient Medications Prior to Visit  Medication Sig Dispense Refill  . acetaminophen (TYLENOL) 500 MG tablet Take 500 mg by mouth every 6 (six) hours as needed.    Marland Kitchen albuterol (PROVENTIL HFA;VENTOLIN HFA) 108 (90 Base) MCG/ACT inhaler Inhale 2 puffs into the lungs every 6 (six) hours as needed for wheezing or shortness of breath. 1 Inhaler 2  . alendronate (FOSAMAX) 70 MG tablet TAKE 1 TABLET BY MOUTH ONCE A WEEK ON AN EMPTY STOMACH AND WITH A FULL GLASS OF WATER 12 tablet 0  . artificial tears (LACRILUBE) OINT ophthalmic ointment Place into the left eye at bedtime as needed for dry eyes. 3.5 g 1  . aspirin 81 MG tablet Take 81 mg by mouth daily.    . celecoxib (CELEBREX) 100 MG capsule TAKE 1 CAPSULE(100 MG) BY MOUTH TWICE DAILY 180 capsule 1  . cetirizine (ZYRTEC) 10 MG tablet Take 1 tablet (10 mg total) by mouth daily. 30 tablet 11  . hydrocortisone (ANUSOL-HC) 25 MG suppository Place 1 suppository (25 mg total)  rectally 2 (two) times daily. 12 suppository 0  . losartan (COZAAR) 50 MG tablet Take 1 tablet by mouth once daily 90 tablet 3  . Multiple Vitamins-Minerals (ICAPS AREDS 2 PO) Take by mouth.    . Olopatadine HCl 0.2 % SOLN Apply 1 drop to eye daily. 2.5 mL 2  . Selenium Sulfide 2.25 % SHAM Apply 1 application topically once a week. 1 Bottle 1  . Vitamin D, Ergocalciferol, (DRISDOL) 1.25 MG (50000 UNIT) CAPS capsule TAKE 1 CAPSULE BY MOUTH EVERY 7 DAYS 8 capsule 0   No facility-administered medications prior to visit.    No Known Allergies  ROS Review of Systems  Constitutional: Negative.   HENT: Negative.   Eyes: Negative.   Respiratory:  Negative.   Cardiovascular: Negative.   Gastrointestinal: Negative.   Endocrine: Negative.   Genitourinary: Negative.   Musculoskeletal: Negative.   Neurological: Positive for weakness. Negative for dizziness and headaches.       Left facial drooping      Objective:    Physical Exam Constitutional:      Appearance: She is obese.  Eyes:     General: No visual field deficit.    Pupils: Pupils are equal, round, and reactive to light.  Cardiovascular:     Rate and Rhythm: Normal rate and regular rhythm.     Pulses: Normal pulses.  Pulmonary:     Effort: Pulmonary effort is normal.  Abdominal:     General: Bowel sounds are normal.  Musculoskeletal:        General: Normal range of motion.  Skin:    General: Skin is warm.  Neurological:     Mental Status: She is alert and oriented to person, place, and time.     Cranial Nerves: Facial asymmetry present.     Sensory: No sensory deficit.     Motor: No tremor.     Comments: Ptosis to left eye  Left facial drooping     BP (!) 156/76 (BP Location: Left Arm, Patient Position: Sitting, Cuff Size: Normal)   Pulse 66   Temp (!) 97.2 F (36.2 C) (Temporal)   Ht 4\' 8"  (1.422 m)   Wt 160 lb 3.2 oz (72.7 kg)   SpO2 99%   BMI 35.92 kg/m  Wt Readings from Last 3 Encounters:  12/12/20 160 lb 3.2 oz (72.7 kg)  02/22/20 167 lb (75.8 kg)  12/14/19 170 lb (77.1 kg)     Health Maintenance Due  Topic Date Due  . Hepatitis C Screening  Never done  . COLONOSCOPY (Pts 45-5223yrs Insurance coverage will need to be confirmed)  Never done  . PNA vac Low Risk Adult (2 of 2 - PPSV23) 12/11/2018  . COVID-19 Vaccine (3 - Booster for Pfizer series) 08/17/2020    There are no preventive care reminders to display for this patient.  No results found for: TSH Lab Results  Component Value Date   WBC 6.9 11/26/2020   HGB 15.0 11/26/2020   HCT 44.0 11/26/2020   MCV 97.4 11/26/2020   PLT 207 11/26/2020   Lab Results  Component Value Date    NA 140 11/26/2020   K 4.5 11/26/2020   CO2 25 11/26/2020   GLUCOSE 111 (H) 11/26/2020   BUN 30 (H) 11/26/2020   CREATININE 0.80 11/26/2020   BILITOT 0.6 11/26/2020   ALKPHOS 97 11/26/2020   AST 45 (H) 11/26/2020   ALT 41 11/26/2020   PROT 7.3 11/26/2020   ALBUMIN 4.0 11/26/2020   CALCIUM  9.8 11/26/2020   ANIONGAP 10 11/26/2020   Lab Results  Component Value Date   CHOL 138 12/14/2019   Lab Results  Component Value Date   HDL 47 12/14/2019   Lab Results  Component Value Date   LDLCALC 54 12/14/2019   Lab Results  Component Value Date   TRIG 229 (H) 12/14/2019   Lab Results  Component Value Date   CHOLHDL 2.9 12/14/2019   Lab Results  Component Value Date   HGBA1C 5.8 (A) 02/22/2020      Assessment & Plan:   Problem List Items Addressed This Visit      Other   Language barrier to communication    Other Visit Diagnoses    Left-sided Bell's palsy    -  Primary   Relevant Medications   predniSONE (DELTASONE) 20 MG tablet   valACYclovir (VALTREX) 1000 MG tablet   hydroxypropyl methylcellulose / hypromellose (ISOPTO TEARS / GONIOVISC) 2.5 % ophthalmic solution   Ptosis of left eyelid       Relevant Medications   hydroxypropyl methylcellulose / hypromellose (ISOPTO TEARS / GONIOVISC) 2.5 % ophthalmic solution      Meds ordered this encounter  Medications  . predniSONE (DELTASONE) 20 MG tablet    Sig: Take 3 tablets (60 mg total) by mouth daily with breakfast.    Dispense:  21 tablet    Refill:  0    Order Specific Question:   Supervising Provider    Answer:   Quentin Angst [id number]  . valACYclovir (VALTREX) 1000 MG tablet    Sig: Take 1 tablet (1,000 mg total) by mouth 2 (two) times daily.    Dispense:  21 tablet    Refill:  0    Order Specific Question:   Supervising Provider    Answer:   Quentin Angst [id number]  . hydroxypropyl methylcellulose / hypromellose (ISOPTO TEARS / GONIOVISC) 2.5 % ophthalmic solution    Sig: Place 1 drop  into the left eye 3 (three) times daily as needed for dry eyes.    Dispense:  15 mL    Refill:  12    Order Specific Question:   Supervising Provider    Answer:   Quentin Angst [[id number]]   1. Left-sided Bell's palsy Left-sided Bell's palsy suspected.  However, patient has left upper extremity weakness.  Patient may benefit from further evaluation per neurology. - predniSONE (DELTASONE) 20 MG tablet; Take 3 tablets (60 mg total) by mouth daily with breakfast.  Dispense: 21 tablet; Refill: 0 - hydroxypropyl methylcellulose / hypromellose (ISOPTO TEARS / GONIOVISC) 2.5 % ophthalmic solution; Place 1 drop into the left eye 3 (three) times daily as needed for dry eyes.  Dispense: 15 mL; Refill: 12 - valACYclovir (VALTREX) 1000 MG tablet; Take 1 tablet (1,000 mg total) by mouth 3 (three) times daily.  Dispense: 21 tablet; Refill: 0 - Ambulatory referral to Neurology  2. Language barrier to communication Utilize video interpreter to assist with communication.  Patient primarily speaks Spanish, but understands some English  3. Ptosis of left eyelid Patient is followed by ophthalmology. - hydroxypropyl methylcellulose / hypromellose (ISOPTO TEARS / GONIOVISC) 2.5 % ophthalmic solution; Place 1 drop into the left eye 3 (three) times daily as needed for dry eyes.  Dispense: 15 mL; Refill: 12 - Ambulatory referral to Neurology  4. Left leg paresthesias Referral sent to neurology, will defer for further work-up and evaluation.  Follow-up: Return in about 1 month (around 01/12/2021).    Eugenie Norrie  Shelton Silvas, MSN, FNP-C Patient Care [facility] [address] [place], [place] [address] [phone]

## 2020-12-12 NOTE — Patient Instructions (Signed)
Parlisis facial en los adultos Bell's Palsy, Adult  La parlisis facial es una incapacidad a corto plazo para mover los msculos de una parte del rostro. La incapacidad para moverse, tambin llamada parlisis, es el resultado de la inflamacin o la compresin del sptimo nervio craneal. Este nervio se desplaza a lo largo del crneo y por debajo de la oreja hasta el lado de la cara. Este nervio es responsable de los movimientos faciales, que incluyen parpadear, cerrar los ojos, sonrer y Development worker, community. Cules son las causas? Se desconoce la causa exacta de esta afeccin. Puede ser causada por la infeccin de un virus, como el virus de la varicela (herpes zster), de Epstein-Barr o de las paperas. Qu incrementa el riesgo? Es ms probable que sufra esta afeccin si:  Est embarazada.  Tiene diabetes.  Recientemente ha tenido una infeccin en la nariz, la garganta o las vas respiratorias.  Tiene debilitado el sistema de defensa del organismo (sistema inmunitario).  Tuvo una lesin facial, como una fractura.  Tiene antecedentes familiares de parlisis facial. Cules son los signos o los sntomas? Los sntomas de esta afeccin incluyen:  Debilidad en un lado del rostro.  Cada del prpado y de la comisura de la boca.  Exceso de lagrimeo en uno de los ojos.  Dificultad para cerrar el prpado.  Venita [place].  Sequedad en la boca.  Cambios en el sentido del gusto.  Cambio en el aspecto facial.  Dolor detrs de [place].  Zumbido en una o ambas orejas.  Sensibilidad al ruido en [place].  Contraccin facial.  Dolor de cabeza.  Trastornos del habla.  Mareos.  Dificultad para comer o beber. La mayor parte del [place], solo una parte del rostro se ve afectada. En casos muy poco frecuentes, la parlisis facial puede afectar todo el rostro. Cmo se diagnostica? Esta afeccin se diagnostica en funcin de lo siguiente:  Sus sntomas.  Sus antecedentes  mdicos.  Un examen fsico. Es posible que, adems, deba consultar a un especialista en trastornos de los nervios (neurlogo) o enfermedades y afecciones oculares (oftalmlogo). Pueden hacerle estudios, por ejemplo:  Una prueba para controlar si hubo dao nervioso (electromiograma).  Estudios de diagnstico por imgenes, como una exploracin por tomografa computarizada (TC) o Health visitor (RM).  Anlisis de [place]. Cmo se trata? Esta afeccin afecta a cada persona de un modo diferente. A veces, los sntomas desaparecen sin tratamiento en el plazo de unas semanas. Si el tratamiento es necesario, vara de persona a Social worker. El objetivo del tratamiento es reducir la inflamacin y proteger los ojos para que no se daen. El tratamiento de la parlisis facial puede incluir lo siguiente:  Medicamentos, como por ejemplo: ? Corticoesteroides para reducir la hinchazn y la inflamacin. ? Medicamentos antivirales. ? Analgsicos, como aspirina, paracetamol o ibuprofeno.  Gotas oftlmicas o ungento para [employer].  Proteccin para los ojos, si no puede cerrar el ojo.  Ejercicios o masajes para recuperar la fuerza y la funcin del msculo (fisioterapia). Siga estas instrucciones en su casa:  Use los medicamentos de venta libre y los recetados solamente como se lo haya indicado el mdico.  Si el ojo est afectado: ? Aplique el ungento o las gotas oftlmicas para Pharmacologist los ojos humectados tal como se lo haya indicado el mdico. ? Siga las instrucciones del mdico para el cuidado y Training and development officer de los ojos como se lo haya indicado el mdico.  Haga los ejercicios de fisioterapia como se  lo haya indicado el mdico.  Cumpla con todas las visitas de seguimiento. Esto es importante.   Comunquese con un mdico si:  Tiene fiebre o escalofros.  Los sntomas no mejoran en un lapso de 2 a 3semanas, o empeoran.  El ojo est rojo, irritado o le duele.  Aparecen  nuevos sntomas. Solicite ayuda de inmediato si:  Siente debilidad o entumecimiento en alguna parte del cuerpo que no sea el rostro.  Tiene dificultad para tragar.  Tiene dolor o rigidez en el cuello.  Tiene mareos o Company secretary. Resumen  La parlisis facial es una incapacidad a corto plazo para mover los msculos de una parte del rostro. La incapacidad para moverse es el resultado de la inflamacin o la compresin del nervio facial.  Esta afeccin afecta a cada persona de un modo diferente. A veces, los sntomas desaparecen sin tratamiento en el plazo de unas semanas.  Si el tratamiento es necesario, vara de persona a Social worker. El objetivo del tratamiento es reducir la inflamacin y proteger los ojos para que no se daen.  Comunquese con el mdico si los sntomas no mejoran en el lapso de 2 a 3semanas, o empeoran. Esta informacin no tiene Theme park manager el consejo del mdico. Asegrese de hacerle al mdico cualquier pregunta que tenga. Document Revised: 08/28/2020 Document Reviewed: 08/28/2020 Elsevier Patient Education  2021 [employer].

## 2020-12-14 ENCOUNTER — Other Ambulatory Visit: Payer: Self-pay

## 2020-12-14 ENCOUNTER — Ambulatory Visit
Admission: RE | Admit: 2020-12-14 | Discharge: 2020-12-14 | Disposition: A | Discharge status: Home / Self Care | Payer: Medicare Other | Source: Ambulatory Visit | Attending: Family Medicine | Admitting: Family Medicine

## 2020-12-14 DIAGNOSIS — Z1231 Encounter for screening mammogram for malignant neoplasm of breast: Secondary | ICD-10-CM

## 2020-12-19 ENCOUNTER — Ambulatory Visit: Payer: Medicare Other | Admitting: Family Medicine

## 2021-01-10 ENCOUNTER — Encounter: Payer: Self-pay | Admitting: Neurology

## 2021-01-10 ENCOUNTER — Other Ambulatory Visit: Payer: Self-pay

## 2021-01-10 ENCOUNTER — Ambulatory Visit (INDEPENDENT_AMBULATORY_CARE_PROVIDER_SITE_OTHER): Payer: Medicare Other | Admitting: Neurology

## 2021-01-10 VITALS — BP 147/78 | HR 93 | Ht <= 58 in | Wt 164.4 lb

## 2021-01-10 DIAGNOSIS — R200 Anesthesia of skin: Secondary | ICD-10-CM

## 2021-01-10 DIAGNOSIS — R202 Paresthesia of skin: Secondary | ICD-10-CM

## 2021-01-10 DIAGNOSIS — G510 Bell's palsy: Secondary | ICD-10-CM

## 2021-01-10 NOTE — Progress Notes (Signed)
[facility] [address] [place]. [place] [address] [phone]       OFFICE CONSULT NOTE  [name] Date of Birth:  Sep 12, 1946 Medical Record Number:  [id number]   Referring MD:  Elizabeth Orleans, FNP  Reason for Referral: Bell's palsy  HPI: Elizabeth Jennings is a 75 year old Hispanic lady seen today for initial office consultation visit.  She is accompanied by Elizabeth Jennings Spanish language interpreter.  History is obtained from them and review of electronic medical records.  I personally reviewed available imaging films in PACS.  She has past medical history of hypertension, hyperlipidemia and osteoporosis.  She presented to [facility] emergency room on 11/26/2020 with sudden onset of left hemifacial weakness.  She also complained of some paresthesias in the left face.  She is unable to clearly tell me if she had left hand paresthesias which began at the same time as she has had it from before.  She had trouble closing her eyes and the left corner of the mouth was drooping to the left.  She was seen in the ER and had an outpatient MRI scan of the brain done which showed no acute abnormality only mild age-related changes of small vessel disease.  Prior to that a CT head was done which is also unremarkable.  Patient was given prescription for prednisone taper pack but she misunderstood the instructions and did not start taking them.  She has been using artificial tears liberally and does cover her left eye at night while sleeping with tape.  She states the left corner of the mouth is not drooping as much and eyes not tearing or burning as much.  She has left eye pain is also improved.  She denied any hyperacusis or absence of taste on the left side.  There is no significant pain behind her left ear at the onset of the symptoms.  She denies prior history of Bell's palsy or any neurological problems in the past but on inquiry states 25 years ago she had episode when all of a sudden the left  half of the body became numb at that time she was in [place] and remembers getting brain scans done but no evidence of stroke was found but the symptoms gradually improved after several months she has no residual deficits now from that.  She denies any history of tick bite, rashes.  ROS:   14 system review of systems is positive for facial weakness.  Eye itching, eye dryness, decreased hearing, numbness of the hand and legs and all other systems negative PMH:  Past Medical History:  Diagnosis Date  . Hemorrhoids   . Hyperlipidemia   . Hypertension   . Osteoporosis   . Prediabetes     Social History:  Social History   Socioeconomic History  . Marital status: Divorced    Spouse name: Not on file  . Number of children: Not on file  . Years of education: Not on file  . Highest education level: Not on file  Occupational History  . Not on file  Tobacco Use  . Smoking status: Never Smoker  . Smokeless tobacco: Never Used  Vaping Use  . Vaping Use: Never used  Substance and Sexual Activity  . Alcohol use: No  . Drug use: No  . Sexual activity: Not on file  Other Topics Concern  . Not on file  Social History Narrative   Lives with son   Right Handed   Drinks no caffeien   Social Determinants  of Health   Financial Resource Strain: Not on file  Food Insecurity: Not on file  Transportation Needs: Not on file  Physical Activity: Not on file  Stress: Not on file  Social Connections: Not on file  Intimate Partner Violence: Not on file    Medications:   Current Outpatient Medications on File Prior to Visit  Medication Sig Dispense Refill  . acetaminophen (TYLENOL) 500 MG tablet Take 500 mg by mouth every 6 (six) hours as needed.    Marland Kitchen aspirin 81 MG tablet Take 81 mg by mouth daily.    . carboxymethylcellulose (REFRESH PLUS) 0.5 % SOLN 1 drop 3 (three) times daily as needed.    Marland Kitchen losartan (COZAAR) 50 MG tablet Take 1 tablet by mouth once daily 90 tablet 3  . Multiple  Vitamins-Minerals (ICAPS AREDS 2 PO) Take by mouth.    . Olopatadine HCl 0.2 % SOLN Apply 1 drop to eye daily. 2.5 mL 2  . predniSONE (DELTASONE) 20 MG tablet Take 3 tablets (60 mg total) by mouth daily with breakfast. 21 tablet 0  . alendronate (FOSAMAX) 70 MG tablet TAKE 1 TABLET BY MOUTH ONCE A WEEK ON AN EMPTY STOMACH AND WITH A FULL GLASS OF WATER 12 tablet 0  . celecoxib (CELEBREX) 100 MG capsule TAKE 1 CAPSULE(100 MG) BY MOUTH TWICE DAILY 180 capsule 1  . cetirizine (ZYRTEC) 10 MG tablet Take 1 tablet (10 mg total) by mouth daily. 30 tablet 11  . hydrocortisone (ANUSOL-HC) 25 MG suppository Place 1 suppository (25 mg total) rectally 2 (two) times daily. 12 suppository 0  . hydroxypropyl methylcellulose / hypromellose (ISOPTO TEARS / GONIOVISC) 2.5 % ophthalmic solution Place 1 drop into the left eye 3 (three) times daily as needed for dry eyes. 15 mL 12  . Selenium Sulfide 2.25 % SHAM Apply 1 application topically once a week. 1 Bottle 1  . valACYclovir (VALTREX) 1000 MG tablet Take 1 tablet (1,000 mg total) by mouth 3 (three) times daily. 21 tablet 0  . Vitamin D, Ergocalciferol, (DRISDOL) 1.25 MG (50000 UNIT) CAPS capsule TAKE 1 CAPSULE BY MOUTH EVERY 7 DAYS 8 capsule 0   No current facility-administered medications on file prior to visit.    Allergies:  No Known Allergies  Physical Exam General: Mildly obese petite Hispanic lady, seated, in no evident distress Head: head normocephalic and atraumatic.   Neck: supple with no carotid or supraclavicular bruits Cardiovascular: regular rate and rhythm, no murmurs Musculoskeletal: no deformity Skin:  no rash/petichiae Vascular:  Normal pulses all extremities  Neurologic Exam Mental Status: Awake and fully alert. Oriented to place and time. Recent and remote memory intact. Attention span, concentration and fund of knowledge appropriate. Mood and affect appropriate.  Cranial Nerves: Fundoscopic exam reveals sharp disc margins. Pupils  equal, briskly reactive to light. Extraocular movements full without nystagmus. Visual fields full to confrontation. Hearing intact. Facial sensation intact.  Left peripheral facial nerve weakness with weakness of frowning, eye closure, smiling and left corner of the mouth.  Reversed drooping of the left lower eyelid.,  No synkinetic movements seen.  Subjective diminished touch pinprick sensation on the left hemiface.  Tongue, palate moves normally and symmetrically.  Motor: Normal bulk and tone. Normal strength in all tested extremity muscles. Sensory.: intact to touch , pinprick , position and vibratory sensation.  Tinel sign is negative over the left wrist. Coordination: Rapid alternating movements normal in all extremities. Finger-to-nose and heel-to-shin performed accurately bilaterally. Gait and Station: Arises from chair without  difficulty. Stance is normal. Gait demonstrates normal stride length and balance . Able to heel, toe and tandem walk without difficulty.  Reflexes: 1+ and symmetric. Toes downgoing.      ASSESSMENT: 75 year old Hispanic lady with left peripheral facial nerve weakness since January 2022 from Bell's palsy.  Intermittent left hand paresthesias likely from carpal tunnel syndrome.  Remote history of left hemibody paresthesias 25 years ago of unclear etiology.  No new or old stroke noticed on MRI scan.     PLAN: I had a long discussion with the patient using Spanish language interpreter explained the diagnosis of left-sided Bell's palsy as well as left hand numbness which possibly is from carpal tunnel syndrome.  I recommend she continue to use artificial tears liberally during the day and use Lacri-Lube ointment at night to cover her left eye to avoid infection.  I expect gradual improvement over the next 6 months and recommend she do facial muscle exercises at home.  If the left eyelid does not improve may consider referral to eye surgeon for surgery at next follow-up  visit.  I also advised her to wear carpal tunnel splint in the left hand and to avoid activities which involve rapid repetitive wrist flexion movements.  Check lab work for Lyme titer and ANA panel.  She will return for follow-up in the future in 3 months or call earlier if necessary.  Greater than 50% time during this 45-minute consultation visit was spent on counseling and coordination of care about facial weakness and hand paresthesias and answering questions. Delia Heady, MD Medical Director [facility] Pager: [phone] 01/10/2021 11:13 AM  Note: This document was prepared with digital dictation and possible smart phrase technology. Any transcriptional errors that result from this process are unintentional.

## 2021-01-10 NOTE — Patient Instructions (Signed)
I had a long discussion with the patient using Spanish language interpreter explained the diagnosis of left-sided Bell's palsy as well as left hand numbness which possibly is from carpal tunnel syndrome.  I recommend she continue to use artificial tears liberally during the day and use Lacri-Lube ointment at night to cover her left eye to avoid infection.  I expect gradual improvement over the next 6 months and recommend she do facial muscle exercises at home.  If the left eyelid does not improve may consider referral to eye surgeon for surgery at next follow-up visit.  I also advised her to wear carpal tunnel splint in the left hand and to avoid activities which involve rapid repetitive wrist flexion movements.  Check lab work for Lyme titer and ANA panel.  She will return for follow-up in the future in 3 months or call earlier if necessary.  Parlisis facial en los adultos Bell's Palsy, Adult  La parlisis facial es una incapacidad a corto plazo para mover los msculos de una parte del rostro. La incapacidad para moverse, tambin llamada parlisis, es el resultado de la inflamacin o la compresin del sptimo nervio craneal. Este nervio se desplaza a lo largo del crneo y por debajo de la oreja hasta el lado de la cara. Este nervio es responsable de los movimientos faciales, que incluyen parpadear, cerrar los ojos, sonrer y Development worker, community. Cules son las causas? Se desconoce la causa exacta de esta afeccin. Puede ser causada por la infeccin de un virus, como el virus de la varicela (herpes zster), de Epstein-Barr o de las paperas. Qu incrementa el riesgo? Es ms probable que sufra esta afeccin si:  Est embarazada.  Tiene diabetes.  Recientemente ha tenido una infeccin en la nariz, la garganta o las vas respiratorias.  Tiene debilitado el sistema de defensa del organismo (sistema inmunitario).  Tuvo una lesin facial, como una fractura.  Tiene antecedentes familiares de parlisis  facial. Cules son los signos o los sntomas? Los sntomas de esta afeccin incluyen:  Debilidad en un lado del rostro.  Cada del prpado y de la comisura de la boca.  Exceso de lagrimeo en uno de los ojos.  Dificultad para cerrar el prpado.  Venita Sheffield.  Sequedad en la boca.  Cambios en el sentido del gusto.  Cambio en el aspecto facial.  Dolor detrs de [place].  Zumbido en una o ambas orejas.  Sensibilidad al ruido en [place].  Contraccin facial.  Dolor de cabeza.  Trastornos del habla.  Mareos.  Dificultad para comer o beber. La mayor parte del [place], solo una parte del rostro se ve afectada. En casos muy poco frecuentes, la parlisis facial puede afectar todo el rostro. Cmo se diagnostica? Esta afeccin se diagnostica en funcin de lo siguiente:  Sus sntomas.  Sus antecedentes mdicos.  Un examen fsico. Es posible que, adems, deba consultar a un especialista en trastornos de los nervios (neurlogo) o enfermedades y afecciones oculares (oftalmlogo). Pueden hacerle estudios, por ejemplo:  Una prueba para controlar si hubo dao nervioso (electromiograma).  Estudios de diagnstico por imgenes, como una exploracin por tomografa computarizada (TC) o Health visitor (RM).  Anlisis de [place]. Cmo se trata? Esta afeccin afecta a cada persona de un modo diferente. A veces, los sntomas desaparecen sin tratamiento en el plazo de unas semanas. Si el tratamiento es necesario, vara de persona a Social worker. El objetivo del tratamiento es reducir la inflamacin y proteger los ojos para que no se daen.  El tratamiento de la parlisis facial puede incluir lo siguiente:  Medicamentos, como por ejemplo: ? Corticoesteroides para reducir la hinchazn y la inflamacin. ? Medicamentos antivirales. ? Analgsicos, como aspirina, paracetamol o ibuprofeno.  Gotas oftlmicas o ungento para [employer].  Proteccin para los ojos,  si no puede cerrar el ojo.  Ejercicios o masajes para recuperar la fuerza y la funcin del msculo (fisioterapia). Siga estas instrucciones en su casa:  Use los medicamentos de venta libre y los recetados solamente como se lo haya indicado el mdico.  Si el ojo est afectado: ? Aplique el ungento o las gotas oftlmicas para Pharmacologist los ojos humectados tal como se lo haya indicado el mdico. ? Siga las instrucciones del mdico para el cuidado y Training and development officer de los ojos como se lo haya indicado el mdico.  Haga los ejercicios de fisioterapia como se lo haya indicado el mdico.  Cumpla con todas las visitas de seguimiento. Esto es importante.   Comunquese con un mdico si:  Tiene fiebre o escalofros.  Los sntomas no mejoran en un lapso de 2 a 3semanas, o empeoran.  El ojo est rojo, irritado o le duele.  Aparecen nuevos sntomas. Solicite ayuda de inmediato si:  Siente debilidad o entumecimiento en alguna parte del cuerpo que no sea el rostro.  Tiene dificultad para tragar.  Tiene dolor o rigidez en el cuello.  Tiene mareos o Company secretary. Resumen  La parlisis facial es una incapacidad a corto plazo para mover los msculos de una parte del rostro. La incapacidad para moverse es el resultado de la inflamacin o la compresin del nervio facial.  Esta afeccin afecta a cada persona de un modo diferente. A veces, los sntomas desaparecen sin tratamiento en el plazo de unas semanas.  Si el tratamiento es necesario, vara de persona a Social worker. El objetivo del tratamiento es reducir la inflamacin y proteger los ojos para que no se daen.  Comunquese con el mdico si los sntomas no mejoran en el lapso de 2 a 3semanas, o empeoran. Esta informacin no tiene Theme park manager el consejo del mdico. Asegrese de hacerle al mdico cualquier pregunta que tenga. Document Revised: 08/28/2020 Document Reviewed: 08/28/2020 Elsevier Patient Education  2021 [employer].

## 2021-01-11 LAB — ANA COMPREHENSIVE PANEL
Anti JO-1: <0.2 AI (ref 0.0–0.9)
Centromere Ab Screen: <0.2 AI (ref 0.0–0.9)
Chromatin Ab SerPl-aCnc: <0.2 AI (ref 0.0–0.9)
ENA RNP Ab: <0.2 AI (ref 0.0–0.9)
ENA SM Ab Ser-aCnc: <0.2 AI (ref 0.0–0.9)
ENA SSA (RO) Ab: <0.2 AI (ref 0.0–0.9)
ENA SSB (LA) Ab: <0.2 AI (ref 0.0–0.9)
Scleroderma (Scl-70) (ENA) Antibody, IgG: 1.8 AI — ABNORMAL HIGH (ref 0.0–0.9)
dsDNA Ab: 3 IU/mL (ref 0–9)

## 2021-01-11 LAB — B. BURGDORFI ANTIBODIES: Lyme IgG/IgM Ab: <0.91 {ISR} (ref 0.00–0.90)

## 2021-01-16 ENCOUNTER — Other Ambulatory Visit: Payer: Self-pay

## 2021-01-16 ENCOUNTER — Encounter: Payer: Self-pay | Admitting: Family Medicine

## 2021-01-16 ENCOUNTER — Ambulatory Visit (INDEPENDENT_AMBULATORY_CARE_PROVIDER_SITE_OTHER): Payer: Medicare Other | Admitting: Family Medicine

## 2021-01-16 VITALS — BP 144/76 | HR 67 | Temp 97.9°F | Ht <= 58 in | Wt 163.0 lb

## 2021-01-16 DIAGNOSIS — M1990 Unspecified osteoarthritis, unspecified site: Secondary | ICD-10-CM

## 2021-01-16 DIAGNOSIS — Z789 Other specified health status: Secondary | ICD-10-CM

## 2021-01-16 DIAGNOSIS — I10 Essential (primary) hypertension: Secondary | ICD-10-CM | POA: Diagnosis not present

## 2021-01-16 DIAGNOSIS — E785 Hyperlipidemia, unspecified: Secondary | ICD-10-CM | POA: Diagnosis not present

## 2021-01-16 DIAGNOSIS — G510 Bell's palsy: Secondary | ICD-10-CM | POA: Diagnosis not present

## 2021-01-16 MED ORDER — MELOXICAM 7.5 MG PO TABS: 7.5000 mg | ORAL_TABLET | Freq: Every day | ORAL | 5 refills | Status: DC

## 2021-01-16 NOTE — Progress Notes (Signed)
[facility]   Established Patient Office Visit  Subjective:  Patient ID: Elizabeth Jennings, female    DOB: 24-Dec-1945  Age: 75 y.o. MRN: [id number]  CC:  Chief Complaint  Patient presents with  . Follow-up    Follow up , htn ,follow up from bell's palsy , having eye pain     HPI Elizabeth Jennings is a very pleasant 75 year old female who presents for follow-up of left-sided Bell's palsy.  Patient also has a history of essential hypertension, hyperlipidemia, and osteoarthritis and is following up for chronic conditions as well.  Patient was recently evaluated by neurologist for a left-sided Bell's palsy.  She was originally diagnosed on 12/12/2020.  At that time, patient was treated with valacyclovir and prednisone.  She continues to have left facial drooping, left eye pain, and numbness to left face.  Patient continues to use artificial tears liberally and left eye pain is improved when covering left eye with tape.  Patient says that left facial drooping is improving. Patient also has a history of hypertension hyperlipidemia.  She has not been following a low-fat, low carbohydrate diet or exercising.  She continues to take antihypertensives consistently.  She does not check blood pressure at home.  She denies any chest pain, dizziness, headache, blurred vision, urinary symptoms, lower extremity swelling, nausea, or diarrhea.  Patient has a history of osteoarthritis.  She is complaining of left upper extremity and left knee pain that is unrelieved by Celebrex.  She last had Celebrex this a.m. without sustained relief.  Past Medical History:  Diagnosis Date  . Bell's palsy   . Hemorrhoids   . Hyperlipidemia   . Hypertension   . Osteoporosis   . Prediabetes     Past Surgical History:  Procedure Laterality Date  . EYE SURGERY  2017   cataracts both eyes   . EYE SURGERY  2018   eye lid surgery     No family history on file.  Social History    Socioeconomic History  . Marital status: Divorced    Spouse name: Not on file  . Number of children: Not on file  . Years of education: Not on file  . Highest education level: Not on file  Occupational History  . Not on file  Tobacco Use  . Smoking status: Never Smoker  . Smokeless tobacco: Never Used  Vaping Use  . Vaping Use: Never used  Substance and Sexual Activity  . Alcohol use: No  . Drug use: No  . Sexual activity: Not on file  Other Topics Concern  . Not on file  Social History Narrative   Lives with son   Right Handed   Drinks no caffeien   Social Determinants of Health   Financial Resource Strain: Not on file  Food Insecurity: Not on file  Transportation Needs: Not on file  Physical Activity: Not on file  Stress: Not on file  Social Connections: Not on file  Intimate Partner Violence: Not on file    Outpatient Medications Prior to Visit  Medication Sig Dispense Refill  . acetaminophen (TYLENOL) 500 MG tablet Take 500 mg by mouth every 6 (six) hours as needed.    Marland Kitchen alendronate (FOSAMAX) 70 MG tablet TAKE 1 TABLET BY MOUTH ONCE A WEEK ON AN EMPTY STOMACH AND WITH A FULL GLASS OF WATER 12 tablet 0  . aspirin 81 MG tablet Take 81 mg by mouth daily.    . carboxymethylcellulose (REFRESH PLUS) 0.5 %  SOLN 1 drop 3 (three) times daily as needed.    . celecoxib (CELEBREX) 100 MG capsule TAKE 1 CAPSULE(100 MG) BY MOUTH TWICE DAILY 180 capsule 1  . hydroxypropyl methylcellulose / hypromellose (ISOPTO TEARS / GONIOVISC) 2.5 % ophthalmic solution Place 1 drop into the left eye 3 (three) times daily as needed for dry eyes. 15 mL 12  . Multiple Vitamins-Minerals (ICAPS AREDS 2 PO) Take by mouth.    . Olopatadine HCl 0.2 % SOLN Apply 1 drop to eye daily. 2.5 mL 2  . predniSONE (DELTASONE) 20 MG tablet Take 3 tablets (60 mg total) by mouth daily with breakfast. 21 tablet 0  . Selenium Sulfide 2.25 % SHAM Apply 1 application topically once a week. 1 Bottle 1  . valACYclovir  (VALTREX) 1000 MG tablet Take 1 tablet (1,000 mg total) by mouth 3 (three) times daily. 21 tablet 0  . Vitamin D, Ergocalciferol, (DRISDOL) 1.25 MG (50000 UNIT) CAPS capsule TAKE 1 CAPSULE BY MOUTH EVERY 7 DAYS 8 capsule 0  . cetirizine (ZYRTEC) 10 MG tablet Take 1 tablet (10 mg total) by mouth daily. 30 tablet 11  . losartan (COZAAR) 50 MG tablet Take 1 tablet by mouth once daily 90 tablet 3  . hydrocortisone (ANUSOL-HC) 25 MG suppository Place 1 suppository (25 mg total) rectally 2 (two) times daily. 12 suppository 0   No facility-administered medications prior to visit.    No Known Allergies  ROS Review of Systems  Constitutional: Negative for chills and diaphoresis.  HENT: Negative.   Eyes: Positive for pain and visual disturbance.       Left-sided Bell's palsy, ptosis present  Respiratory: Negative.   Cardiovascular: Negative.   Gastrointestinal: Negative.   Genitourinary: Negative.   Musculoskeletal: Positive for arthralgias. Negative for joint swelling.  Skin: Negative.   Neurological: Positive for facial asymmetry. Negative for dizziness.  Hematological: Negative.   Psychiatric/Behavioral: Negative.       Objective:    Physical Exam Constitutional:      Appearance: Normal appearance.  Eyes:     General: No visual field deficit. Cardiovascular:     Pulses: Normal pulses.  Pulmonary:     Effort: Pulmonary effort is normal.  Abdominal:     General: Bowel sounds are normal.     Palpations: Abdomen is soft.  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 Decreased strength.  Skin:    General: Skin is warm.  Neurological:     Mental Status: She is alert and oriented to person, place, and time.     Cranial Nerves: Facial asymmetry present.     Sensory: No sensory deficit.  Psychiatric:        Mood and Affect: Mood normal.        Thought Content: Thought content normal.        Judgment: Judgment normal.     BP (!) 144/76  (BP Location: Left Arm, Patient Position: Sitting, Cuff Size: Normal)   Pulse 67   Temp 97.9 F (36.6 C) (Temporal)   Ht 4\' 8"  (1.422 m)   Wt 163 lb (73.9 kg)   SpO2 97%   BMI 36.54 kg/m  Wt Readings from Last 3 Encounters:  01/16/21 163 lb (73.9 kg)  01/10/21 164 lb 6.4 oz (74.6 kg)  12/12/20 160 lb 3.2 oz (72.7 kg)     Health Maintenance Due  Topic Date Due  . Hepatitis C Screening  Never done  . COLONOSCOPY (Pts 45-25yrs Insurance  coverage will need to be confirmed)  Never done  . PNA vac Low Risk Adult (2 of 2 - PPSV23) 12/11/2018  . COVID-19 Vaccine (3 - Booster for Pfizer series) 08/17/2020    There are no preventive care reminders to display for this patient.  No results found for: TSH Lab Results  Component Value Date   WBC 6.9 11/26/2020   HGB 15.0 11/26/2020   HCT 44.0 11/26/2020   MCV 97.4 11/26/2020   PLT 207 11/26/2020   Lab Results  Component Value Date   NA 140 11/26/2020   K 4.5 11/26/2020   CO2 25 11/26/2020   GLUCOSE 111 (H) 11/26/2020   BUN 30 (H) 11/26/2020   CREATININE 0.80 11/26/2020   BILITOT 0.6 11/26/2020   ALKPHOS 97 11/26/2020   AST 45 (H) 11/26/2020   ALT 41 11/26/2020   PROT 7.3 11/26/2020   ALBUMIN 4.0 11/26/2020   CALCIUM 9.8 11/26/2020   ANIONGAP 10 11/26/2020   Lab Results  Component Value Date   CHOL 138 12/14/2019   Lab Results  Component Value Date   HDL 47 12/14/2019   Lab Results  Component Value Date   LDLCALC 54 12/14/2019   Lab Results  Component Value Date   TRIG 229 (H) 12/14/2019   Lab Results  Component Value Date   CHOLHDL 2.9 12/14/2019   Lab Results  Component Value Date   HGBA1C 5.8 (A) 02/22/2020      Assessment & Plan:   Problem List Items Addressed This Visit      Cardiovascular and Mediastinum   Essential hypertension - Primary   Relevant Orders   POCT Urinalysis Dipstick     Nervous and Auditory   Left-sided Bell's palsy     Musculoskeletal and Integument   Arthritis    Relevant Medications   meloxicam (MOBIC) 7.5 MG tablet     Other   Language barrier to communication    Other Visit Diagnoses    Hyperlipidemia LDL goal <70       Relevant Orders   Lipid Panel      1. Essential hypertension BP (!) 144/76 (BP Location: Left Arm, Patient Position: Sitting, Cuff Size: Normal)   Pulse 67   Temp 97.9 F (36.6 C) (Temporal)   Ht 4\' 8"  (1.422 m)   Wt 163 lb (73.9 kg)   SpO2 97%   BMI 36.54 kg/m  - Continue medication, monitor blood pressure at home. Continue DASH diet. Reminder to go to the ER if any CP, SOB, nausea, dizziness, severe HA, changes vision/speech, left arm numbness and tingling and jaw pain.    - POCT Urinalysis Dipstick  2. Left-sided Bell's palsy Patient is scheduled for nerve conduction studies, left-sided  3. Language barrier to communication Patient mostly speaks Spanish, but understands some .  Utilize video interpreter to assist with communication.  4. Hyperlipidemia LDL goal <70 The patient is asked to make an attempt to improve diet and exercise patterns to aid in medical management of this problem.  - Lipid Panel  5. Arthritis We will start a trial of meloxicam 7.5 mg daily for worsening arthritis. Patient also advised to continue extra strength Tylenol 500 mg every 6 hours as needed for mild to moderate pain. - meloxicam (MOBIC) 7.5 MG tablet; Take 1 tablet (7.5 mg total) by mouth daily.  Dispense: 30 tablet; Refill: 5   Follow-up: Return in about 3 months (around 04/18/2021).    06/18/2021  APRN, MSN, FNP-C [facility]  [facility] [address] [place], [place] [address] [phone]

## 2021-01-16 NOTE — Progress Notes (Signed)
Kindly inform the patient that lab work for Lyme disease and lupus and inflammatory conditions was unremarkable

## 2021-01-16 NOTE — Patient Instructions (Addendum)
For your arthritis we will discontinue Celebrex twice daily and start meloxicam 7.5 mg daily.  Also, you can use Tylenol 500 mg every 4 hours as needed for mild to moderate pain.  Please take these medications with food in order to prevent gastrointestinal upset.  For your hypertension, we will continue losartan 50 mg daily.  We will check your cholesterol per lab and follow-up with you by phone with lab results.  I recommend a low-fat, low carbohydrate diet divided over small meals throughout the day.  For Bell's palsy, continue to follow-up with neurology.  Also, it is important that you undergo nerve conduction studies that is scheduled for January 30, 2021.  Will defer to neurology for further treatment and evaluation of this problem.  No medications warranted at this time.  He has completed both Valtrex and prednisone for this problem and no other medications are needed.   Para su artritis, descontinuaremos Celebrex dos veces al da y comenzaremos con 7.5 mg de meloxicam al da. Adems, puede usar Tylenol 500 mg cada 4 horas segn sea necesario para el dolor leve a moderado. Tome estos medicamentos con alimentos para International aid/development worker.  Para su hipertensin, continuaremos con losartan 50 mg diarios.  Controlaremos su colesterol por laboratorio y haremos un seguimiento con usted por telfono con los resultados del laboratorio. Recomiendo una dieta baja en grasas y carbohidratos dividida en comidas pequeas a lo largo del Futures trader.  Para la parlisis de Bell, contine con el seguimiento con neurologa. Adems, es importante que se someta a estudios de conduccin nerviosa programados para el 15 de Wright de 2022. Se remitir a neurologa para un tratamiento y evaluacin adicionales de este problema. No hay medicamentos justificados en este momento. Ha completado Valtrex y prednisona para este problema y no se necesitan otros medicamentos.

## 2021-01-17 LAB — LIPID PANEL
Chol/HDL Ratio: 3 ratio (ref 0.0–4.4)
Cholesterol, Total: 158 mg/dL (ref 100–199)
HDL: 52 mg/dL (ref >39)
LDL Chol Calc (NIH): 80 mg/dL (ref 0–99)
Triglycerides: 150 mg/dL — ABNORMAL HIGH (ref 0–149)
VLDL Cholesterol Cal: 26 mg/dL (ref 5–40)

## 2021-01-18 ENCOUNTER — Telehealth: Payer: Self-pay | Admitting: Family Medicine

## 2021-01-18 ENCOUNTER — Telehealth: Payer: Self-pay | Admitting: *Deleted

## 2021-01-18 DIAGNOSIS — Z23 Encounter for immunization: Secondary | ICD-10-CM | POA: Diagnosis not present

## 2021-01-18 NOTE — Telephone Encounter (Signed)
Elizabeth Jennings 75 year old female with a medical history significant for hypertriglyceridemia, hypertension, and obesity was evaluated in clinic on 01/16/2021 for follow-up of her chronic conditions. Reviewed lipid panel, triglycerides have improved greatly over the past 6 months.  Triglyceride levels 150 and goal is less than 150.  Continue to follow a low-fat diet and increase daily physical activity.  No medication changes are warranted at this time.  Remind patient to follow-up for conduction studies on January 30, 2021 per neurology.  She will continue to follow-up with PCP every 3 months.   Nolon Nations  APRN, MSN, FNP-C [facility] [address] [place], [place] [address] [phone]

## 2021-01-18 NOTE — Telephone Encounter (Signed)
[employer] interpreters, spoke with Donata Clay [phone], she called patient and informed that lab work for Lyme disease and lupus and inflammatory conditions was unremarkable.  Interpreter reminded her of EMG appointment on 01/30/21 and patient confirmed she will be here.  Patient verbalized understanding, appreciation.

## 2021-01-25 NOTE — Telephone Encounter (Signed)
Called pt wasn't able to lvm , pt voicemail was full. Use the interpreter

## 2021-01-30 ENCOUNTER — Ambulatory Visit (INDEPENDENT_AMBULATORY_CARE_PROVIDER_SITE_OTHER): Payer: Medicare Other | Admitting: Neurology

## 2021-01-30 ENCOUNTER — Encounter: Payer: Self-pay | Admitting: Neurology

## 2021-01-30 ENCOUNTER — Other Ambulatory Visit: Payer: Self-pay

## 2021-01-30 DIAGNOSIS — R202 Paresthesia of skin: Secondary | ICD-10-CM

## 2021-01-30 DIAGNOSIS — G510 Bell's palsy: Secondary | ICD-10-CM

## 2021-01-30 DIAGNOSIS — R200 Anesthesia of skin: Secondary | ICD-10-CM

## 2021-01-30 DIAGNOSIS — Z0289 Encounter for other administrative examinations: Secondary | ICD-10-CM

## 2021-01-30 NOTE — Progress Notes (Signed)
[facility]    Nerve / Sites Muscle Latency Ref. Amplitude Ref. Rel Amp Segments Distance Velocity Ref. Area    ms ms mV mV %  cm m/s m/s mVms  L Median - APB     Wrist APB 3.7 ?4.4 3.3 ?4.0 100 Wrist - APB 7   12.7     Upper arm APB 7.4  3.0  91.9 Upper arm - Wrist 18 49 ?49 13.0     Ulnar Wrist APB 2.9  3.0  101 Ulnar Wrist - APB    9.7     Ulnar Below Elbow APB 5.4  3.3  107 Ulnar Below Elbow - APB    9.5     Ulnar Above Elbow APB 7.3  3.0  92.4 Ulnar Above Elbow - Ulnar Below Elbow    9.2  L Ulnar - ADM     Wrist ADM 2.8 ?3.3 9.7 ?6.0 100 Wrist - ADM 7   21.5     B.Elbow ADM 5.2  8.3  85 B.Elbow - Wrist 16 68 ?49 20.0     A.Elbow ADM 6.7  8.2  98.9 A.Elbow - B.Elbow 10 64 ?49 20.1         SNC    Nerve / Sites Rec. Site Peak Lat Ref.  Amp Ref. Segments Distance Peak Diff Ref.    ms ms V V  cm ms ms  L Median - Orthodromic (Dig II, Mid palm)     Dig II Wrist 3.4 ?3.4 10 ?10 Dig II - Wrist 13    L Ulnar - Orthodromic, (Dig V, Mid palm)     Dig V Wrist 2.5 ?3.1 7 ?5 Dig V - Wrist 96           F  Wave    Nerve F Lat Ref.   ms ms  L Ulnar - ADM 24.1 ?32.0

## 2021-01-30 NOTE — Progress Notes (Signed)
Please refer to EMG and nerve conduction procedure note.  

## 2021-01-30 NOTE — Procedures (Signed)
     HISTORY:  Elizabeth Jennings is a 75 year old patient who presented with a left-sided Bell's palsy on 26 November 2020.  The patient developed left facial numbness and left arm numbness as well.  The facial and arm numbness has persisted, the patient is being evaluated for this issue.  She does report some occasional neck discomfort.  NERVE CONDUCTION STUDIES:  Nerve conduction studies were performed on the left upper extremity. The distal motor latencies and motor amplitudes for the median and ulnar nerves were within normal limits, with exception of a slightly low motor amplitude for left median nerve. The nerve conduction velocities for these nerves were also normal. The sensory latencies for the median and ulnar nerves were normal. The F wave latency for the ulnar nerve was within normal limits.   EMG STUDIES:  EMG study was performed on the left upper extremity:  The first dorsal interosseous muscle reveals 2 to 4 K units with full recruitment. No fibrillations or positive waves were noted. The abductor pollicis brevis muscle reveals 2 to 4 K units with full recruitment. No fibrillations or positive waves were noted. The extensor indicis proprius muscle reveals 1 to 3 K units with full recruitment. No fibrillations or positive waves were noted. The pronator teres muscle reveals 2 to 3 K units with full recruitment. No fibrillations or positive waves were noted. The biceps muscle reveals 1 to 2 K units with full recruitment. No fibrillations or positive waves were noted. The triceps muscle reveals 2 to 4 K units with full recruitment. No fibrillations or positive waves were noted. The anterior deltoid muscle reveals 2 to 3 K units with full recruitment. No fibrillations or positive waves were noted. The cervical paraspinal muscles were tested at 2 levels. No abnormalities of insertional activity were seen at either level tested. There was good relaxation.   IMPRESSION:  Nerve  conduction studies done on the left upper extremity were unremarkable, no evidence of neuropathy was seen.  EMG evaluation of the left upper extremity is unremarkable, there is no evidence of an overlying cervical radiculopathy.  Marlan Palau MD 01/30/2021 3:02 PM  [facility] [address] [place], [place] [address]  Phone [phone] Fax [phone]

## 2021-02-04 NOTE — Progress Notes (Signed)
Kindly inform the patient that electrical study of the nerves does not show any evidence of nerve damage or pinched nerve.

## 2021-02-07 ENCOUNTER — Telehealth: Payer: Self-pay | Admitting: *Deleted

## 2021-02-07 NOTE — Telephone Encounter (Addendum)
[employer], spoke with [place], # [id number]. She called patient,   informed that electrical study of the nerves does not show any evidence of nerve damage or pinched nerve. Answered her questions to her stated satisfaction. Stated she was happy with our service. Patient verbalized understanding, appreciation.

## 2021-02-12 ENCOUNTER — Other Ambulatory Visit: Payer: Self-pay

## 2021-02-12 MED ORDER — VITAMIN D (ERGOCALCIFEROL) 1.25 MG (50000 UNIT) PO CAPS: ORAL_CAPSULE | ORAL | 0 refills | Status: DC

## 2021-02-21 DIAGNOSIS — G510 Bell's palsy: Secondary | ICD-10-CM | POA: Diagnosis not present

## 2021-02-21 DIAGNOSIS — H16212 Exposure keratoconjunctivitis, left eye: Secondary | ICD-10-CM | POA: Diagnosis not present

## 2021-02-21 DIAGNOSIS — H02135 Senile ectropion of left lower eyelid: Secondary | ICD-10-CM | POA: Diagnosis not present

## 2021-03-13 DIAGNOSIS — Z8673 Personal history of transient ischemic attack (TIA), and cerebral infarction without residual deficits: Secondary | ICD-10-CM | POA: Diagnosis not present

## 2021-03-13 DIAGNOSIS — H0220C Unspecified lagophthalmos, bilateral, upper and lower eyelids: Secondary | ICD-10-CM | POA: Diagnosis not present

## 2021-03-13 DIAGNOSIS — Z7982 Long term (current) use of aspirin: Secondary | ICD-10-CM | POA: Diagnosis not present

## 2021-03-13 DIAGNOSIS — H02155 Paralytic ectropion of left lower eyelid: Secondary | ICD-10-CM | POA: Diagnosis not present

## 2021-03-13 DIAGNOSIS — G510 Bell's palsy: Secondary | ICD-10-CM | POA: Diagnosis not present

## 2021-04-03 ENCOUNTER — Other Ambulatory Visit: Payer: Self-pay | Admitting: Nurse Practitioner

## 2021-04-03 DIAGNOSIS — I10 Essential (primary) hypertension: Secondary | ICD-10-CM

## 2021-04-24 ENCOUNTER — Ambulatory Visit (INDEPENDENT_AMBULATORY_CARE_PROVIDER_SITE_OTHER): Payer: Medicare Other | Admitting: Family Medicine

## 2021-04-24 ENCOUNTER — Encounter: Payer: Self-pay | Admitting: Family Medicine

## 2021-04-24 ENCOUNTER — Other Ambulatory Visit: Payer: Self-pay

## 2021-04-24 VITALS — BP 177/100 | Temp 98.0°F | Resp 16 | Ht <= 58 in | Wt 167.8 lb

## 2021-04-24 DIAGNOSIS — I10 Essential (primary) hypertension: Secondary | ICD-10-CM

## 2021-04-24 DIAGNOSIS — Z9109 Other allergy status, other than to drugs and biological substances: Secondary | ICD-10-CM

## 2021-04-24 DIAGNOSIS — Z789 Other specified health status: Secondary | ICD-10-CM

## 2021-04-24 DIAGNOSIS — F3289 Other specified depressive episodes: Secondary | ICD-10-CM

## 2021-04-24 DIAGNOSIS — M1990 Unspecified osteoarthritis, unspecified site: Secondary | ICD-10-CM

## 2021-04-24 MED ORDER — CETIRIZINE HCL 10 MG PO TABS: 10.0000 mg | ORAL_TABLET | Freq: Every day | ORAL | 3 refills | Status: DC

## 2021-04-24 MED ORDER — MELOXICAM 7.5 MG PO TABS: 7.5000 mg | ORAL_TABLET | Freq: Every day | ORAL | 1 refills | Status: DC

## 2021-04-24 MED ORDER — FLUTICASONE PROPIONATE 50 MCG/ACT NA SUSP
2.0000 | Freq: Every day | NASAL | 6 refills | Status: DC
Start: 2021-04-24 — End: 2022-02-28

## 2021-04-24 MED ORDER — LOSARTAN POTASSIUM 50 MG PO TABS: 50.0000 mg | ORAL_TABLET | Freq: Every day | ORAL | 0 refills | Status: DC

## 2021-04-24 NOTE — Patient Instructions (Signed)
Allergies, Adult An allergy means that your body reacts to something that bothers it (allergen). This can happen from something that you eat, breathe in, or touch. Allergies often affect the nose, eyes, skin, and stomach. They can be mild, moderate, or very bad (severe). An allergy cannot spread from person to person. They can happen at any age. Sometimes, people outgrow them. What are the causes?  Outdoor things, such as pollen, car fumes, and mold.  Indoor things, such as dust, smoke, mold, and pets.  Foods.  Medicines.  Things that bother your skin, such as perfume and bug bites. What increases the risk?  Having family members with allergies or asthma. What are the signs or symptoms? Symptoms depend on how bad your allergy is. Mild to moderate symptoms  Runny nose, stuffy nose, or sneezing.  Itchy mouth, ears, or throat.  A feeling of mucus dripping down the back of your throat.  Sore throat.  Eyes that are itchy, red, watery, or puffy.  A skin rash, or red, swollen areas of skin (hives).  Stomach cramps or bloating. Severe symptoms Very bad allergies to food, medicine, or bug bites may cause a very bad allergy reaction (anaphylaxis). This can be life-threatening. Symptoms include:  A red face.  Wheezing or coughing.  Swollen lips, tongue, or mouth.  Tight or swollen throat.  Chest pain or tightness, or a fast heartbeat.  Trouble breathing or shortness of breath.  Pain in your belly (abdomen), vomiting, or watery poop (diarrhea).  Feeling dizzy or fainting. How is this treated? Treatment for this condition depends on your symptoms. Treatment may include:  Cold, wet cloths for itching and swelling.  Eye drops, nose sprays, or skin creams.  Washing out your nose each day.  A humidifier.  Medicines.  A change to the foods you eat.  Being exposed again and again to tiny amounts of allergens. This helps your body get used to them. You might  have: ? Allergy shots. ? Very small amounts of allergen put under your tongue.  An emergency shot (auto-injector pen) if you have a very bad allergy reaction. ? This is a medicine with a needle. You can put it into your skin by yourself. ? Your doctor will teach you how to use it.      Follow these instructions at home: Medicines  Take or apply over-the-counter and prescription medicines only as told by your doctor.  If you are at risk for a very bad allergy reaction, keep an auto-injector pen with you all the time.   Eating and drinking  Follow instructions from your doctor about what to eat and drink.  Drink enough fluid to keep your pee (urine) pale yellow. General instructions  If you have ever had a very bad allergy reaction, wear a medical alert bracelet or necklace.  Stay away from things that you are allergic to.  Keep all follow-up visits as told by your doctor. This is important. Contact a doctor if:  Your symptoms do not get better with treatment. Get help right away if:  You have symptoms of a very bad allergy reaction. These include: ? A swollen mouth, tongue, or throat. ? Pain or tightness in your chest. ? Trouble breathing. ? Being short of breath. ? Dizziness. ? Fainting. ? Very bad pain in your belly. ? Vomiting. ? Watery poop. These symptoms may be an emergency. Do not wait to see if the symptoms will go away. Get medical help right away. Call your local   emergency services (911 in the [place] Do not drive yourself to the hospital. Summary  Take or apply over-the-counter and prescription medicines only as told by your doctor.  Stay away from things you are allergic to.  If you are at risk for a very bad allergy reaction, carry an auto-injector pen all the time.  Wear a medical alert bracelet or necklace.  Very bad allergy reactions can be life-threatening. Get help right away. This information is not intended to replace advice given to you by your  health care provider. Make sure you discuss any questions you have with your health care provider. Document Revised: 09/15/2019 Document Reviewed: 09/15/2019 Elsevier Patient Education  2021 Elsevier Inc.  

## 2021-04-24 NOTE — Progress Notes (Signed)
[facility]   Established Patient Office Visit  Subjective:  Patient ID: Elizabeth Jennings, female    DOB: [birth year]  Age: 75 y.o. MRN: [id number]  CC: Follow-up of chronic conditions  HPI [name] is a 75 year old female with a medical history significant for uncontrolled hypertension, left-sided Bell's palsy, environmental allergies, and osteoarthritis.  Patient primarily speaks Spanish, utilizing video interpreter to assist with community patient.  Patient has been followed closely by neurology with left-sided Bell's palsy.  She states that she was referred to ophthalmology and has been seeing on a regular basis.  Patient is scheduled for eyelid surgery on 05/16/2021.  She says that she is underwent the surgery before without any complications. Patient also has a history of hypertension.  She says that she has been without losartan for greater than 1 week.  Patient's blood pressure is markedly elevated on arrival at 177/110.  She does not check her blood pressure at home.  Also, she has not been following a low-fat diet.  She says that she has increased her physical activities to walking several times per week.  She denies any lower extremity swelling, chest pain, shortness of breath, heart palpitations, or orthopnea. The patient is complaining of some environmental allergies.  She endorses runny nose, itchy throat, and ear fullness.  She says that she typically has the symptoms on a regular basis.  She was previously taking Zyrtec with some improvement.  She has not attempted any over-the-counter interventions to assist with this problem.  Past Medical History:  Diagnosis Date  . Bell's palsy   . Hemorrhoids   . Hyperlipidemia   . Hypertension   . Osteoporosis   . Prediabetes     Past Surgical History:  Procedure Laterality Date  . EYE SURGERY  2017   cataracts both eyes   . EYE SURGERY  2018   eye lid surgery     No family  history on file.  Social History   Socioeconomic History  . Marital status: Divorced    Spouse name: Not on file  . Number of children: Not on file  . Years of education: Not on file  . Highest education level: Not on file  Occupational History  . Not on file  Tobacco Use  . Smoking status: Never Smoker  . Smokeless tobacco: Never Used  Vaping Use  . Vaping Use: Never used  Substance and Sexual Activity  . Alcohol use: No  . Drug use: No  . Sexual activity: Not on file  Other Topics Concern  . Not on file  Social History Narrative   Lives with son   Right Handed   Drinks no caffeien   Social Determinants of Health   Financial Resource Strain: Not on file  Food Insecurity: Not on file  Transportation Needs: Not on file  Physical Activity: Not on file  Stress: Not on file  Social Connections: Not on file  Intimate Partner Violence: Not on file    Outpatient Medications Prior to Visit  Medication Sig Dispense Refill  . acetaminophen (TYLENOL) 500 MG tablet Take 500 mg by mouth every 6 (six) hours as needed.    Marland Kitchen alendronate (FOSAMAX) 70 MG tablet TAKE 1 TABLET BY MOUTH ONCE A WEEK ON AN EMPTY STOMACH AND WITH A FULL GLASS OF WATER 12 tablet 0  . aspirin 81 MG tablet Take 81 mg by mouth daily.    . carboxymethylcellulose (REFRESH PLUS) 0.5 % SOLN 1 drop  3 (three) times daily as needed.    . hydroxypropyl methylcellulose / hypromellose (ISOPTO TEARS / GONIOVISC) 2.5 % ophthalmic solution Place 1 drop into the left eye 3 (three) times daily as needed for dry eyes. 15 mL 12  . meloxicam (MOBIC) 7.5 MG tablet Take 1 tablet (7.5 mg total) by mouth daily. 30 tablet 5  . Multiple Vitamins-Minerals (ICAPS AREDS 2 PO) Take by mouth.    . Olopatadine HCl 0.2 % SOLN Apply 1 drop to eye daily. 2.5 mL 2  . Selenium Sulfide 2.25 % SHAM Apply 1 application topically once a week. 1 Bottle 1  . Vitamin D, Ergocalciferol, (DRISDOL) 1.25 MG (50000 UNIT) CAPS capsule TAKE 1 CAPSULE BY MOUTH  EVERY 7 DAYS 8 capsule 0   No facility-administered medications prior to visit.    No Known Allergies  ROS Review of Systems  Constitutional: Negative for activity change and appetite change.  HENT: Positive for ear pain and postnasal drip.   Respiratory: Negative.   Cardiovascular: Negative.   Genitourinary: Negative.   Allergic/Immunologic: Positive for environmental allergies.  Neurological: Negative.       Objective:    Physical Exam Constitutional:      Appearance: She is obese.  HENT:     Mouth/Throat:     Mouth: Mucous membranes are moist.  Eyes:     Pupils: Pupils are equal, round, and reactive to light.  Cardiovascular:     Rate and Rhythm: Normal rate and regular rhythm.     Pulses: Normal pulses.  Pulmonary:     Effort: Pulmonary effort is normal.  Abdominal:     General: Bowel sounds are normal.  Musculoskeletal:     Right knee: Swelling present. Decreased range of motion.     Left knee: Swelling present. Decreased range of motion.  Neurological:     General: No focal deficit present.     Mental Status: She is alert. Mental status is at baseline.  Psychiatric:        Mood and Affect: Mood normal.        Thought Content: Thought content normal.        Judgment: Judgment normal.     There were no vitals taken for this visit. Wt Readings from Last 3 Encounters:  01/16/21 163 lb (73.9 kg)  01/10/21 164 lb 6.4 oz (74.6 kg)  12/12/20 160 lb 3.2 oz (72.7 kg)     Health Maintenance Due  Topic Date Due  . Hepatitis C Screening  Never done  . COLONOSCOPY (Pts 45-45yrs Insurance coverage will need to be confirmed)  Never done  . Zoster Vaccines- Shingrix (1 of 2) Never done  . PNA vac Low Risk Adult (2 of 2 - PPSV23) 12/11/2018  . COVID-19 Vaccine (3 - Booster for Pfizer series) 07/17/2020    There are no preventive care reminders to display for this patient.  No results found for: TSH Lab Results  Component Value Date   WBC 6.9 11/26/2020    HGB 15.0 11/26/2020   HCT 44.0 11/26/2020   MCV 97.4 11/26/2020   PLT 207 11/26/2020   Lab Results  Component Value Date   NA 140 11/26/2020   K 4.5 11/26/2020   CO2 25 11/26/2020   GLUCOSE 111 (H) 11/26/2020   BUN 30 (H) 11/26/2020   CREATININE 0.80 11/26/2020   BILITOT 0.6 11/26/2020   ALKPHOS 97 11/26/2020   AST 45 (H) 11/26/2020   ALT 41 11/26/2020   PROT 7.3 11/26/2020  ALBUMIN 4.0 11/26/2020   CALCIUM 9.8 11/26/2020   ANIONGAP 10 11/26/2020   Lab Results  Component Value Date   CHOL 158 01/16/2021   Lab Results  Component Value Date   HDL 52 01/16/2021   Lab Results  Component Value Date   LDLCALC 80 01/16/2021   Lab Results  Component Value Date   TRIG 150 (H) 01/16/2021   Lab Results  Component Value Date   CHOLHDL 3.0 01/16/2021   Lab Results  Component Value Date   HGBA1C 5.8 (A) 02/22/2020      Assessment & Plan:   Problem List Items Addressed This Visit      Cardiovascular and Mediastinum   Essential hypertension   Relevant Orders   CMP and Liver (Completed)     Musculoskeletal and Integument   Arthritis   Relevant Medications   meloxicam (MOBIC) 7.5 MG tablet     Other   Language barrier to communication   Environmental allergies - Primary   Relevant Medications   cetirizine (ZYRTEC ALLERGY) 10 MG tablet    Other Visit Diagnoses    Other depression       Relevant Orders   Ambulatory referral to Psychology     1. Environmental allergies - cetirizine (ZYRTEC ALLERGY) 10 MG tablet; Take 1 tablet (10 mg total) by mouth daily.  Dispense: 90 tablet; Refill: 3  2. Arthritis - meloxicam (MOBIC) 7.5 MG tablet; Take 1 tablet (7.5 mg total) by mouth daily.  Dispense: 90 tablet; Refill: 1  3. Language barrier to communication Patient primarily speaks Spanish.  Utilizing video interpreter to assist with communication.  4. Essential hypertension Discussed the importance of taking medications consistently in order to achieve positive  outcomes.  Patient says that losartan has been at her pharmacy, and she has been unable to pick up medication.  Discussed the importance of following a low-fat, low carbohydrate diet divided over small meals throughout the day.  Discussed her diet at length.- Continue medication, monitor blood pressure at home. Continue DASH diet. Reminder to go to the ER if any CP, SOB, nausea, dizziness, severe HA, changes vision/speech, left arm numbness and tingling and jaw pain.    - CMP and Liver  5. Other depression Depression screen [facility] 2/9 04/24/2021 04/24/2021 02/22/2020  Decreased Interest 1 0 0  Down, Depressed, Hopeless 1 0 0  PHQ - 2 Score 2 0 0  Altered sleeping 1 - -  Tired, decreased energy 1 - -  Change in appetite 0 - -  Feeling bad or failure about yourself  0 - -  Trouble concentrating 0 - -  Moving slowly or fidgety/restless 0 - -  Suicidal thoughts 0 - -  PHQ-9 Score 4 - -   - Ambulatory referral to Psychology   Follow-up: Return in about 1 month (around 05/24/2021) for hypertension.   Nolon Nations  APRN, MSN, FNP-C Patient Care [facility] [address] [place], [place] [address] [phone]

## 2021-04-25 ENCOUNTER — Telehealth: Payer: Self-pay

## 2021-04-25 ENCOUNTER — Other Ambulatory Visit: Payer: Self-pay | Admitting: Family Medicine

## 2021-04-25 DIAGNOSIS — I10 Essential (primary) hypertension: Secondary | ICD-10-CM

## 2021-04-25 LAB — CMP AND LIVER
ALT: 31 IU/L (ref 0–32)
AST: 37 IU/L (ref 0–40)
Albumin: 4.5 g/dL (ref 3.7–4.7)
Alkaline Phosphatase: 101 IU/L (ref 44–121)
BUN: 14 mg/dL (ref 8–27)
Bilirubin Total: 0.3 mg/dL (ref 0.0–1.2)
Bilirubin, Direct: <0.1 mg/dL (ref 0.00–0.40)
CO2: 23 mmol/L (ref 20–29)
Calcium: 9.4 mg/dL (ref 8.7–10.3)
Chloride: 103 mmol/L (ref 96–106)
Creatinine, Ser: 0.66 mg/dL (ref 0.57–1.00)
Glucose: 95 mg/dL (ref 65–99)
Potassium: 4.2 mmol/L (ref 3.5–5.2)
Sodium: 143 mmol/L (ref 134–144)
Total Protein: 7.3 g/dL (ref 6.0–8.5)
eGFR: 93 mL/min/{1.73_m2} (ref >59)

## 2021-04-25 MED ORDER — LOSARTAN POTASSIUM 50 MG PO TABS: 50.0000 mg | ORAL_TABLET | Freq: Every day | ORAL | 1 refills | Status: DC

## 2021-04-25 NOTE — Telephone Encounter (Signed)
BP med to be refilled  No more refills at pharmacy

## 2021-04-25 NOTE — Progress Notes (Signed)
Meds ordered this encounter  Medications  . losartan (COZAAR) 50 MG tablet    Sig: Take 1 tablet (50 mg total) by mouth daily.    Dispense:  90 tablet    Refill:  1    Order Specific Question:   Supervising Provider    Answer:   Quentin Angst [[id number]]     Nolon Nations  APRN, MSN, FNP-C Patient Care [facility] [address] [place], [place] [address] [phone]

## 2021-04-30 ENCOUNTER — Encounter: Payer: Self-pay | Admitting: Neurology

## 2021-04-30 ENCOUNTER — Ambulatory Visit: Payer: Medicare Other | Admitting: Neurology

## 2021-05-16 DIAGNOSIS — H02105 Unspecified ectropion of left lower eyelid: Secondary | ICD-10-CM | POA: Diagnosis not present

## 2021-05-16 DIAGNOSIS — H10402 Unspecified chronic conjunctivitis, left eye: Secondary | ICD-10-CM | POA: Diagnosis not present

## 2021-05-16 DIAGNOSIS — I10 Essential (primary) hypertension: Secondary | ICD-10-CM | POA: Diagnosis not present

## 2021-05-16 DIAGNOSIS — H02155 Paralytic ectropion of left lower eyelid: Secondary | ICD-10-CM | POA: Diagnosis not present

## 2021-05-25 DIAGNOSIS — Z79899 Other long term (current) drug therapy: Secondary | ICD-10-CM | POA: Diagnosis not present

## 2021-05-25 DIAGNOSIS — Z48810 Encounter for surgical aftercare following surgery on the sense organs: Secondary | ICD-10-CM | POA: Diagnosis not present

## 2021-05-28 IMAGING — CT CT HEAD W/O CM
4 series · 15 of 47 positions shown, 17 images · non-contrast
Comparison: CT 02/09/2012

CLINICAL DATA: Left facial and arm numbness which began at [DATE],
left sided facial droop.

EXAM:
CT HEAD WITHOUT CONTRAST
TECHNIQUE: Contiguous axial images were obtained from the base of the skull
through the vertex without intravenous contrast.

[Series 3: head wo · axial · 0.46mm/px · z∈[-100,+20]mm · 7 of 34 slices shown, 9 images]
[im 5/34  brain]
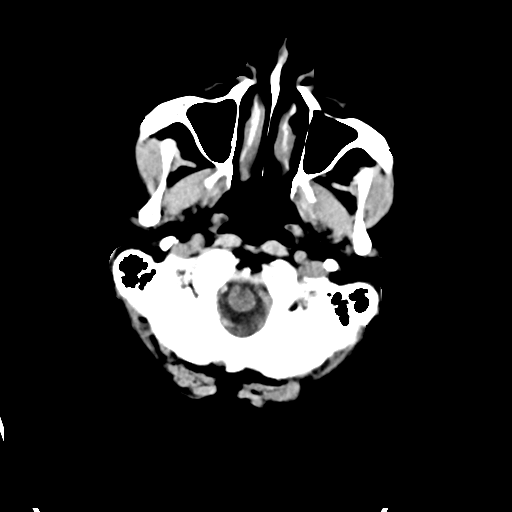
[im 5/34  bone]
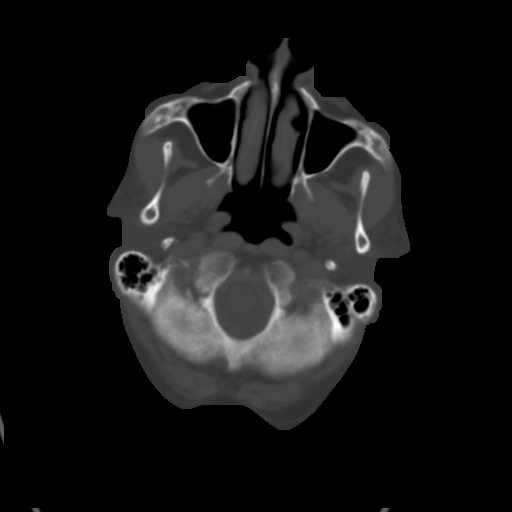
[im 9/34  brain]
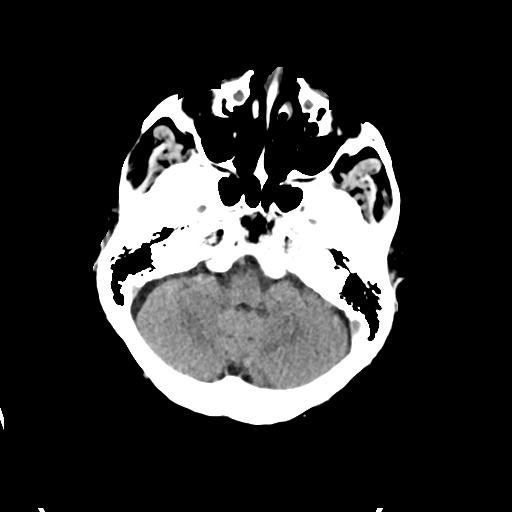
[im 13/34  brain]
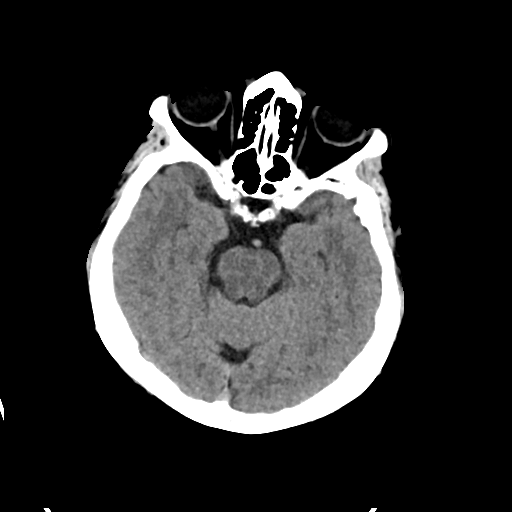
[im 17/34  brain]
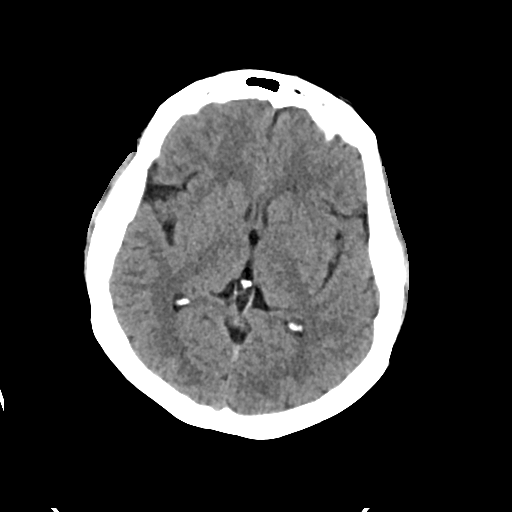
[im 21/34  brain]
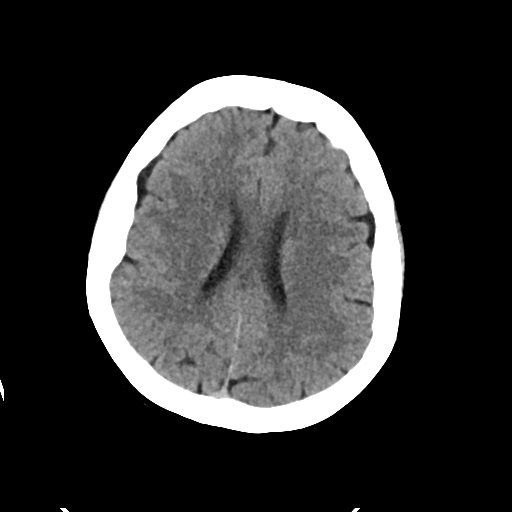
[im 21/34  bone]
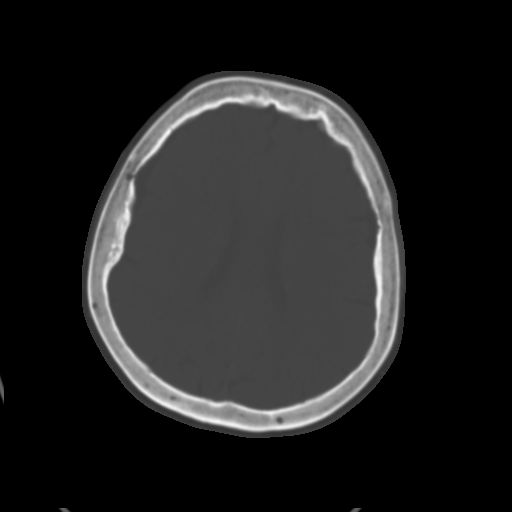
[im 25/34  brain]
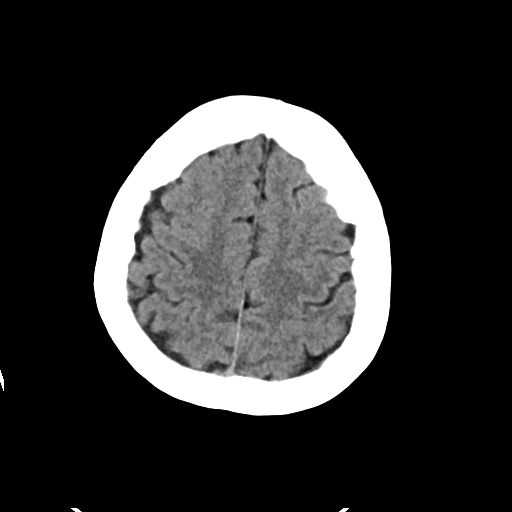
[im 29/34  brain]
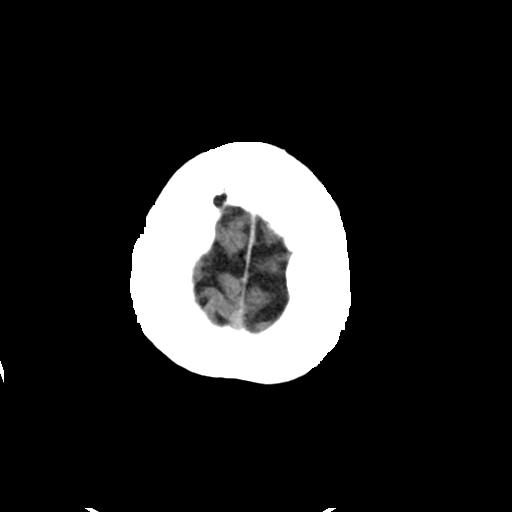

[Series 4: head bone · axial · 0.46mm/px · z∈[-104,-88]mm · 2 of 84 slices shown]
[im 9/84  bone]
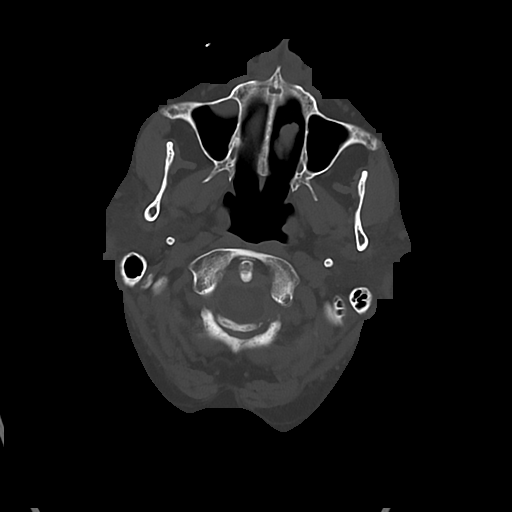
[im 17/84  bone]
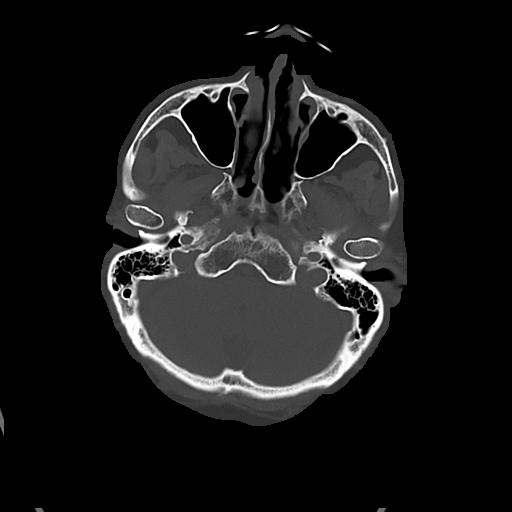

[Series 5: cor soft · coronal · 0.33mm/px · 3 of 91 slices shown]
[im 31/91  brain]
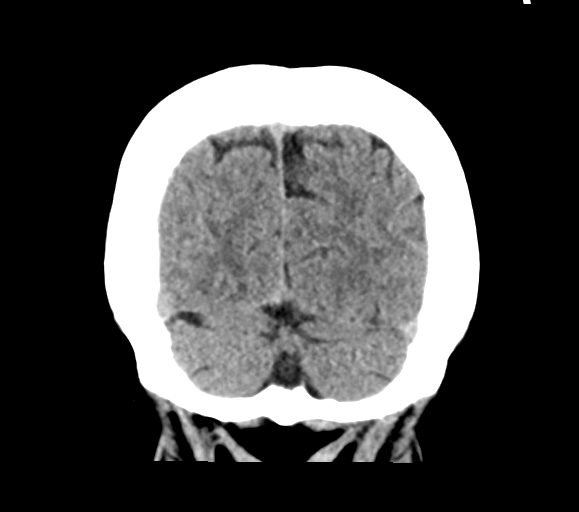
[im 41/91  brain]
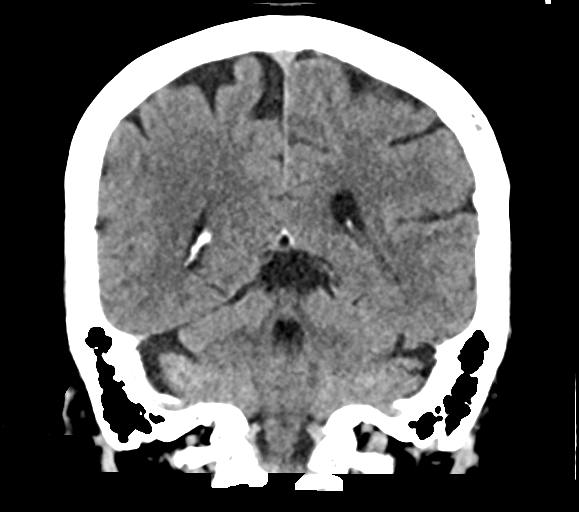
[im 51/91  brain]
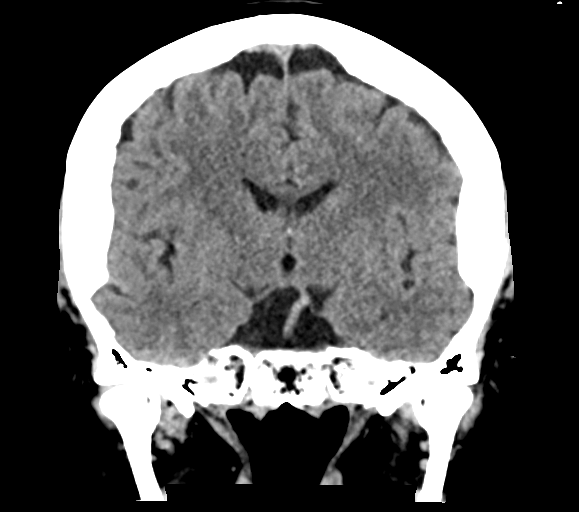

[Series 6: sag soft · sagittal · 0.33mm/px · 3 of 64 slices shown]
[im 22/64  brain]
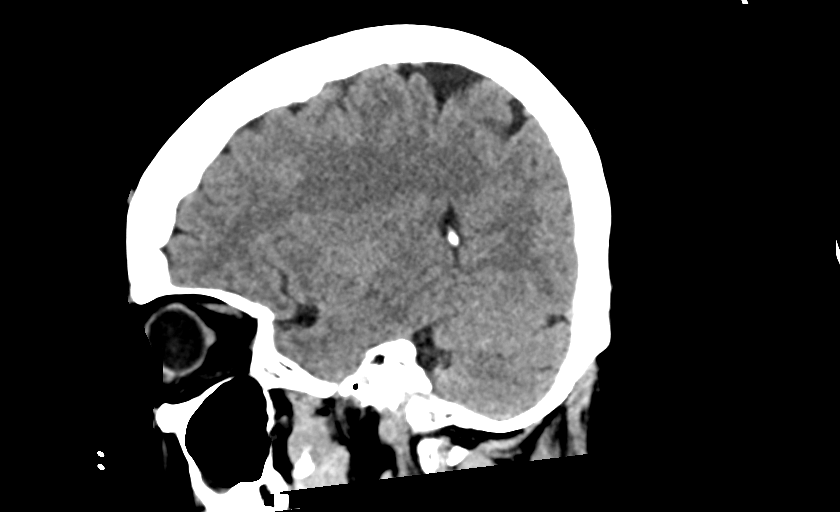
[im 32/64  brain]
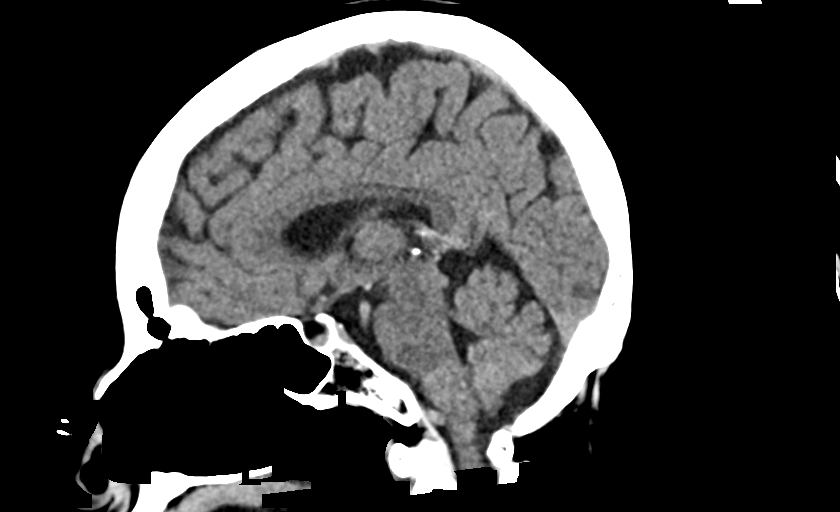
[im 43/64  brain]
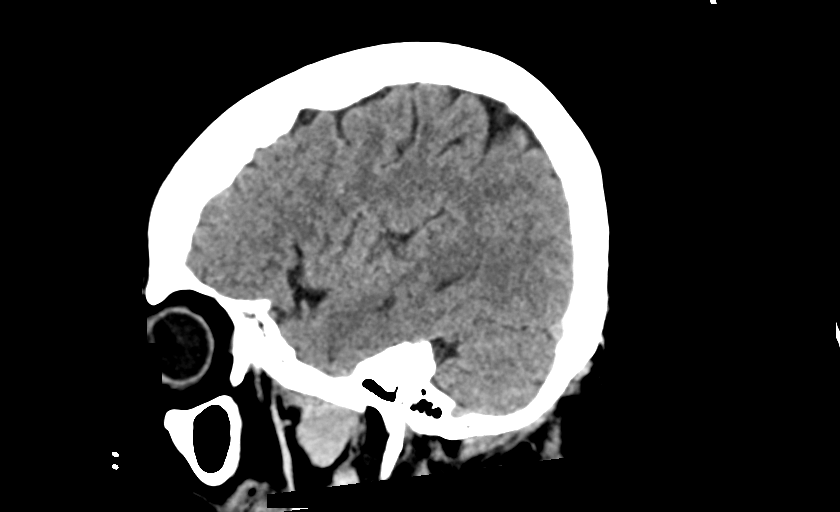

[15 of 47 positions shown; findings below may reference images not displayed]

FINDINGS: Brain: No CT evident areas of large vascular territory or cortically
based infarction with preservation of the gray-white
differentiation. No evidence of acute hemorrhage, hydrocephalus,
extra-axial collection, visible mass lesion or mass effect.
Symmetric prominence of the ventricles, cisterns and sulci
compatible with parenchymal volume loss. Patchy areas of white
matter hypoattenuation are most compatible with chronic
microvascular angiopathy.

Vascular: Atherosclerotic calcification of the carotid siphons and
intradural vertebral arteries. No hyperdense vessel.

Skull: No calvarial fracture or suspicious osseous lesion. No scalp
swelling or hematoma.

Sinuses/Orbits: Minimal mural thickening in the ethmoids and
maxillary sinuses. Remaining paranasal sinuses are predominantly
clear. No pneumatized secretions or layering air-fluid levels.
Orbital structures are unremarkable aside from prior lens
extractions.

Other: Debris in the bilateral external auditory canals.
IMPRESSION: 1. No CT evidence of large vascular territory or cortically based
infarction. If there is persisting clinical concern for infarct, MRI
is more sensitive and specific for early changes of ischemia.
2. Background of parenchymal volume loss and chronic microvascular
angiopathy with intracranial atherosclerosis.
3. Debris in the bilateral external auditory canals, correlate for
cerumen impaction.

## 2021-06-05 ENCOUNTER — Encounter: Payer: Self-pay | Admitting: Family Medicine

## 2021-06-05 ENCOUNTER — Other Ambulatory Visit: Payer: Self-pay

## 2021-06-05 ENCOUNTER — Ambulatory Visit (INDEPENDENT_AMBULATORY_CARE_PROVIDER_SITE_OTHER): Payer: Medicare Other | Admitting: Family Medicine

## 2021-06-05 VITALS — BP 164/92 | HR 71 | Temp 97.9°F | Ht <= 58 in | Wt 169.0 lb

## 2021-06-05 DIAGNOSIS — R7303 Prediabetes: Secondary | ICD-10-CM | POA: Diagnosis not present

## 2021-06-05 DIAGNOSIS — B351 Tinea unguium: Secondary | ICD-10-CM

## 2021-06-05 DIAGNOSIS — Z23 Encounter for immunization: Secondary | ICD-10-CM | POA: Diagnosis not present

## 2021-06-05 DIAGNOSIS — Z789 Other specified health status: Secondary | ICD-10-CM | POA: Diagnosis not present

## 2021-06-05 DIAGNOSIS — Z6837 Body mass index (BMI) 37.0-37.9, adult: Secondary | ICD-10-CM | POA: Diagnosis not present

## 2021-06-05 DIAGNOSIS — Z1159 Encounter for screening for other viral diseases: Secondary | ICD-10-CM | POA: Diagnosis not present

## 2021-06-05 DIAGNOSIS — I10 Essential (primary) hypertension: Secondary | ICD-10-CM

## 2021-06-05 DIAGNOSIS — E559 Vitamin D deficiency, unspecified: Secondary | ICD-10-CM | POA: Diagnosis not present

## 2021-06-05 NOTE — Patient Instructions (Addendum)
Plan de alimentacin para personas con prediabetes Prediabetes Eating Plan La prediabetes es una afeccin que hace que los niveles de azcar en la sangre (glucosa) sean ms altos de lo normal. Esto aumenta el riesgo de desarrollar diabetes tipo 2 (diabetes mellitus tipo 2). Trabajar con un profesional de la salud o especialista en nutricin (nutricionista) para hacer cambios en la dieta y el estilo de vida puede ayudar a prevenir el inicio de la diabetes. Estos cambios pueden ayudarlo a: Controlar los niveles de glucemia. Mejorar los niveles de colesterol. Controlar la presin arterial. Consejos para seguir este plan Al leer las etiquetas de los alimentos Lea las etiquetas de los alimentos envasados para controlar la cantidad de grasa, sal (sodio) y azcar que contienen. Evite los alimentos que contengan lo siguiente: Grasas saturadas. Grasas trans. Azcares agregados. Evite los alimentos que contengan ms de 300 miligramos (mg) de sodio por porcin. Limite el consumo de sodio a menos de 2300 mg por da. Al ir de compras Evite comprar alimentos procesados y preelaborados. Evite comprar bebidas con azcar agregada. Al cocinar Cocine con aceite de oliva. No use mantequilla, manteca de cerdo ni mantequilla clarificada. Cocine los alimentos al horno, a la parrilla, asados, al vapor o hervidos. Evite frerlos. Planificacin de las comidas  Trabaje con el nutricionista para crear un plan de alimentacin que sea adecuado para usted. Esto puede incluir el seguimiento de cuntas caloras ingiere al da. Use un registro de alimentos, un cuaderno o una aplicacin mvil para anotar lo que comi en cada comida. Considere la posibilidad de seguir una dieta mediterrnea. Esta puede comprender lo siguiente: Comer varias porciones de frutas y verduras frescas por da. Pescado al menos dos veces por semana. Comer una porcin de cereales integrales, frijoles, frutos secos y semillas por da. Aceite de oliva  en lugar de otras grasas. Limitar el consumo de alcohol. Limitar la carne roja. Usar productos lcteos descremados o con bajo contenido de grasa. Considerar seguir una dieta a base de vegetales. Esta incluye hacer elecciones alimentarias que se concentren en comer principalmente verduras y frutas, cereales, frijoles, frutos secos y semillas. Si tiene hipertensin arterial, quizs deba limitar el consumo de sodio o seguir una dieta como el plan de alimentacin basado en los Enfoques Alimentarios para Detener la Hipertensin (Dietary Approaches to Stop Hypertension, DASH). La dieta DASH tiene como objetivo bajar la hipertensin arterial.  Estilo de vida Establezca metas para bajar de peso con la ayuda de su equipo de atencin mdica. A la mayora de las personas con prediabetes se les recomienda bajar un 7 % de su peso corporal. Haga al menos 30 minutos de ejercicio, 5 o ms das a la semana. Asista a un grupo de apoyo o solicite el apoyo de un consejero de salud mental. Use los medicamentos de venta libre y los recetados solamente como se lo haya indicado el mdico. Qu alimentos se recomiendan? Frutas Bayas. Bananas. Manzanas. Naranjas. Uvas. Papaya. Mango. Granada. Kiwi. Pomelo.Cerezas. Verduras Lechuga. Espinaca. Guisantes. Remolachas. Coliflor. Repollo. Brcoli. Zanahorias. Tomates. Calabaza. Berenjena. Hierbas. Pimientos. Cebollas.Pepinos. Coles de Bruselas. Granos Productos integrales, como panes, galletas, cereales y pastas de salvado o integrales. Avena sin azcar. Trigo burgol. Cebada. Quinua. Arroz integral.Tacos o tortillas de harina de maz o de salvado. Carnes y otras protenas Mariscos. Carne de ave sin piel. Cortes magros de cerdo y carne de res. Tofu.Huevos. Frutos secos. Frijoles. Lcteos Productos lcteos descremados o semidescremados, como yogur, queso cottage yqueso. Bebidas Agua. T. Caf. Gaseosas sin azcar o dietticas. Soda. Leche   descremada o con bajo contenido de  [place]. Productos alternativos a la [place], como [place] de sojao de [place]. Grasas y aceites Aceite de [place]. Aceite de canola. Aceite de girasol. Aceite de semillas deuva. Aguacate. Nueces. Dulces y postres Pudin sin azcar o con bajo contenido de [place]. Helado y otros postrescongelados sin azcar o con bajo contenido de [place]. Alios y condimentos Hierbas. Especias sin sodio. Mostaza. Salsa de pepinillos. [place]tchup con bajo contenido de sal y de International aid/development worker. Salsa barbacoa con bajo contenido de sal y Clinical biochemist. Mayonesa con bajo contenido de grasa o sin grasa. Es posible que los productos mencionados arriba no formen una lista completa de las bebidas o los alimentos recomendados. Consulte a un nutricionista para obtener ms informacin. Qu alimentos no se recomiendan? Nils Pyle Frutas enlatadas al almbar. Verduras Verduras enlatadas. Verduras congeladas con mantequilla o salsa de crema. Granos Productos elaborados con [place] y [place], como panes, pastas,bocadillos y cereales. Carnes y [address] de carne con alto contenido de Holiday representative. Carne de ave con piel. Carneempanizada o frita. Carnes procesadas. Lcteos Yogur, queso o [employer]. Bebidas Bebidas azucaradas, como t helado y [place]. Grasas y [employer]. Manteca de cerdo. Mantequilla clarificada. Dulces y [employer], como pasteles, pastelitos, galletas dulces y tarta dequeso. Alios y condimentos Mezclas de especias con sal agregada. Ktchup. Salsa barbacoa. Mayonesa. Es posible que los productos que se enumeran ms arriba no sean una lista completa de los alimentos y las bebidas que no se recomiendan. Consulte a un nutricionista para obtener ms informacin. Dnde buscar ms informacin American Diabetes Association (Asociacin Estadounidense de la Diabetes): www.diabetes.org Resumen Es posible que deba hacer cambios en la dieta y el estilo de vida para ayudar a prevenir el inicio  de la diabetes. Estos cambios pueden ayudarlo a Psychologist, clinical, mejorar los niveles de colesterol y Scientist, physiological presin arterial. Establezca metas para bajar de peso con la ayuda de su equipo de atencin mdica. A la [employer] con prediabetes se les recomienda bajar un 7 % de su peso corporal. Considere la posibilidad de seguir Clinical cytogeneticist. Esto incluye comer muchas frutas y verduras frescas, cereales integrales, frijoles, frutos secos, semillas, pescado y productos lcteos con bajo contenido de [place], y usar aceite de Location manager de otras grasas. Esta informacin no tiene Theme park manager el consejo del mdico. Asegresede hacerle al mdico cualquier pregunta que tenga. Document Revised: 05/02/2020 Document Reviewed: 05/02/2020 Elsevier Patient Education  2022 [employer]. [place]

## 2021-06-05 NOTE — Progress Notes (Signed)
Patient [facility]   Established Patient Office Visit  Subjective:  Patient ID: Elizabeth Jennings, female    DOB: 10/01/1946  Age: 75 y.o. MRN: [id number]  CC:  Chief Complaint  Patient presents with   Follow-up    1 month follow up    HPI Elizabeth Jennings is a 75 year old female with a medical history significant for hypertension, Bell's palsy (left side), osteoarthritis, chronic knee pain, prediabetes, and osteoporosis presents for follow-up of chronic condition.  Patient primarily speaks Spanish, utilizing video interpreter to assist with communication. Patient states that she has been doing well.  Her primary complaint is toenail thickening and toenail discoloration.  Patient denies any pain.  She states that toenail thickening has been worsening over the past year.  She has not attempted any over-the-counter interventions to assist with this problem. Patient also has a history of hypertension.  She does not check blood pressures at home.  She does not follow a low-fat diet or exercise.  Patient denies any headache, chest pain, shortness of breath, or diarrhea.  She also denies dizziness.  Past Medical History:  Diagnosis Date   Bell's palsy    Hemorrhoids    Hyperlipidemia    Hypertension    Osteoporosis    Prediabetes     Past Surgical History:  Procedure Laterality Date   EYE SURGERY  2017   cataracts both eyes    EYE SURGERY  2018   eye lid surgery     No family history on file.  Social History   Socioeconomic History   Marital status: Divorced    Spouse name: Not on file   Number of children: Not on file   Years of education: Not on file   Highest education level: Not on file  Occupational History   Not on file  Tobacco Use   Smoking status: Never   Smokeless tobacco: Never  Vaping Use   Vaping Use: Never used  Substance and Sexual Activity   Alcohol use: No   Drug use: No   Sexual activity: Not on file  Other  Topics Concern   Not on file  Social History Narrative   Lives with son   Right Handed   Drinks no caffeien   Social Determinants of Health   Financial Resource Strain: Not on file  Food Insecurity: Not on file  Transportation Needs: Not on file  Physical Activity: Not on file  Stress: Not on file  Social Connections: Not on file  Intimate Partner Violence: Not on file    Outpatient Medications Prior to Visit  Medication Sig Dispense Refill   acetaminophen (TYLENOL) 500 MG tablet Take 500 mg by mouth every 6 (six) hours as needed.     alendronate (FOSAMAX) 70 MG tablet TAKE 1 TABLET BY MOUTH ONCE A WEEK ON AN EMPTY STOMACH AND WITH A FULL GLASS OF WATER 12 tablet 0   aspirin 81 MG tablet Take 81 mg by mouth daily.     carboxymethylcellulose (REFRESH PLUS) 0.5 % SOLN 1 drop 3 (three) times daily as needed.     cetirizine (ZYRTEC ALLERGY) 10 MG tablet Take 1 tablet (10 mg total) by mouth daily. 90 tablet 3   fluticasone (FLONASE) 50 MCG/ACT nasal spray Place 2 sprays into both nostrils daily. 16 g 6   hydroxypropyl methylcellulose / hypromellose (ISOPTO TEARS / GONIOVISC) 2.5 % ophthalmic solution Place 1 drop into the left eye 3 (three) times daily as needed for dry  eyes. 15 mL 12   ketorolac (ACULAR) 0.5 % ophthalmic solution 1 drop as needed (FOR IRRITATION).     losartan (COZAAR) 50 MG tablet Take 1 tablet (50 mg total) by mouth daily. 90 tablet 1   meloxicam (MOBIC) 7.5 MG tablet Take 1 tablet (7.5 mg total) by mouth daily. 90 tablet 1   Multiple Vitamin (MULTIVITAMIN) capsule Take 1 capsule by mouth daily.     Multiple Vitamins-Minerals (ICAPS AREDS 2 PO) Take by mouth.     Olopatadine HCl 0.2 % SOLN Apply 1 drop to eye daily. 2.5 mL 2   prednisoLONE acetate (PRED FORTE) 1 % ophthalmic suspension 1 drop as needed (FOR irritation).     Selenium Sulfide 2.25 % SHAM Apply 1 application topically once a week. 1 Bottle 1   Vitamin D, Ergocalciferol, (DRISDOL) 1.25 MG (50000 UNIT)  CAPS capsule TAKE 1 CAPSULE BY MOUTH EVERY 7 DAYS 8 capsule 0   No facility-administered medications prior to visit.    No Known Allergies  ROS Review of Systems  Constitutional:  Negative for fatigue and fever.  HENT: Negative.    Eyes:  Negative for photophobia and visual disturbance.  Respiratory: Negative.    Cardiovascular: Negative.   Gastrointestinal: Negative.   Endocrine: Negative for polydipsia, polyphagia and polyuria.  Genitourinary: Negative.   Musculoskeletal:  Positive for arthralgias.  Skin: Negative.   Neurological: Negative.   Psychiatric/Behavioral: Negative.       Objective:    Physical Exam Constitutional:      Appearance: She is obese.  Eyes:     Pupils: Pupils are equal, round, and reactive to light.  Cardiovascular:     Rate and Rhythm: Normal rate and regular rhythm.  Pulmonary:     Effort: Pulmonary effort is normal.  Abdominal:     General: Abdomen is flat. Bowel sounds are normal.  Skin:    General: Skin is warm.     Comments: Toenails thickened with light yellow discoloration  Neurological:     General: No focal deficit present.     Mental Status: She is alert. Mental status is at baseline.  Psychiatric:        Mood and Affect: Mood normal.        Thought Content: Thought content normal.        Judgment: Judgment normal.    BP (!) 164/92   Pulse 71   Temp 97.9 F (36.6 C)   Ht 4' 8"  (1.422 m)   Wt 169 lb 0.4 oz (76.7 kg)   BMI 37.89 kg/m  Wt Readings from Last 3 Encounters:  06/05/21 169 lb 0.4 oz (76.7 kg)  04/24/21 167 lb 12.8 oz (76.1 kg)  01/16/21 163 lb (73.9 kg)     Health Maintenance Due  Topic Date Due   Hepatitis C Screening  Never done   COLONOSCOPY (Pts 45-76yr Insurance coverage will need to be confirmed)  Never done   Zoster Vaccines- Shingrix (1 of 2) Never done   PNA vac Low Risk Adult (2 of 2 - PPSV23) 12/11/2018   COVID-19 Vaccine (3 - Booster for Pfizer series) 07/17/2020    There are no preventive  care reminders to display for this patient.  No results found for: TSH Lab Results  Component Value Date   WBC 6.9 11/26/2020   HGB 15.0 11/26/2020   HCT 44.0 11/26/2020   MCV 97.4 11/26/2020   PLT 207 11/26/2020   Lab Results  Component Value Date   NA 143 04/24/2021  K 4.2 04/24/2021   CO2 23 04/24/2021   GLUCOSE 95 04/24/2021   BUN 14 04/24/2021   CREATININE 0.66 04/24/2021   BILITOT 0.3 04/24/2021   ALKPHOS 101 04/24/2021   AST 37 04/24/2021   ALT 31 04/24/2021   PROT 7.3 04/24/2021   ALBUMIN 4.5 04/24/2021   CALCIUM 9.4 04/24/2021   ANIONGAP 10 11/26/2020   EGFR 93 04/24/2021   Lab Results  Component Value Date   CHOL 158 01/16/2021   Lab Results  Component Value Date   HDL 52 01/16/2021   Lab Results  Component Value Date   LDLCALC 80 01/16/2021   Lab Results  Component Value Date   TRIG 150 (H) 01/16/2021   Lab Results  Component Value Date   CHOLHDL 3.0 01/16/2021   Lab Results  Component Value Date   HGBA1C 5.8 (A) 02/22/2020      Assessment & Plan:   Problem List Items Addressed This Visit       Cardiovascular and Mediastinum   Essential hypertension   Relevant Orders   Urinalysis Dipstick     Other   Vitamin D deficiency   Prediabetes   Relevant Orders   HgB A1c   Obesity   Language barrier to communication   Other Visit Diagnoses     Onychomycosis    -  Primary   Relevant Orders   Ambulatory referral to Podiatry   Need for hepatitis C screening test       Relevant Orders   Hepatitis C Antibody (Completed)   Need for vaccination for pneumococcus       Relevant Orders   Pneumococcal polysaccharide vaccine 23-valent greater than or equal to 2yo subcutaneous/IM (Completed)     1. Onychomycosis  - Ambulatory referral to Podiatry  2. Essential hypertension BP (!) 164/92   Pulse 71   Temp 97.9 F (36.6 C)   Ht 4' 8"  (1.422 m)   Wt 169 lb 0.4 oz (76.7 kg)   BMI 37.89 kg/m  - Continue medication, monitor blood  pressure at home. Continue DASH diet.  Reminder to go to the ER if any CP, SOB, nausea, dizziness, severe HA, changes vision/speech, left arm numbness and tingling and jaw pain.  Patient did not take medication prior to arrival.  Discussed importance of taking medications consistently in order to achieve positive outcomes. - Urinalysis Dipstick  3. Vitamin D deficiency We will recheck vitamin D level in 3 months.  4. Language barrier to communication Patient primarily speaks Spanish, utilizing video interpreter to assist with communication  5. Prediabetes  - HgB A1c  6. Class 2 severe obesity due to excess calories with serious comorbidity and body mass index (BMI) of 37.0 to 37.9 in adult [facility]) The patient is asked to make an attempt to improve diet and exercise patterns to aid in medical management of this problem.   7. Need for hepatitis C screening test  - Hepatitis C Antibody  8. Need for vaccination for pneumococcus  - Pneumococcal polysaccharide vaccine 23-valent greater than or equal to 2yo subcutaneous/IM   No orders of the defined types were placed in this encounter.   Follow-up: Return in about 3 months (around 09/05/2021).   Donia Pounds  APRN, MSN, FNP-C [facility] [address] [place], [place] [address] [phone]

## 2021-06-06 ENCOUNTER — Telehealth: Payer: Self-pay | Admitting: Family Medicine

## 2021-06-06 LAB — HEPATITIS C ANTIBODY: Hep C Virus Ab: <0.1 s/co ratio (ref 0.0–0.9)

## 2021-06-06 NOTE — Telephone Encounter (Signed)
Elizabeth Jennings is a 75 year old female with a medical history significant for essential hypertension, obesity, and hyperlipidemia was evaluated in clinic on 06/05/2021.  All laboratory values were reviewed, within a normal range.  Patient to follow-up in 3 months for her chronic conditions.  Nolon Nations  APRN, MSN, FNP-C [facility] [address] [place], [place] [address] [phone]

## 2021-06-11 NOTE — Telephone Encounter (Signed)
Busy tone unable to contact patient.

## 2021-06-13 NOTE — Telephone Encounter (Signed)
Continues to give busy tone.

## 2021-06-18 ENCOUNTER — Other Ambulatory Visit: Payer: Self-pay

## 2021-06-18 MED ORDER — VITAMIN D (ERGOCALCIFEROL) 1.25 MG (50000 UNIT) PO CAPS: ORAL_CAPSULE | ORAL | 0 refills | Status: DC

## 2021-06-19 ENCOUNTER — Ambulatory Visit (INDEPENDENT_AMBULATORY_CARE_PROVIDER_SITE_OTHER): Payer: Medicare Other | Admitting: Podiatry

## 2021-06-19 ENCOUNTER — Other Ambulatory Visit: Payer: Self-pay

## 2021-06-19 DIAGNOSIS — L600 Ingrowing nail: Secondary | ICD-10-CM

## 2021-06-19 MED ORDER — CEPHALEXIN 500 MG PO CAPS: 500.0000 mg | ORAL_CAPSULE | Freq: Three times a day (TID) | ORAL | 0 refills | Status: AC

## 2021-06-19 NOTE — Patient Instructions (Signed)

## 2021-06-20 NOTE — Progress Notes (Signed)
  Subjective:  Patient ID: Elizabeth Jennings, female    DOB: 11/28/1945,  MRN: [id number]  Chief Complaint  Patient presents with   Nail Problem     Pt took a piece of her nail out / pain is a 8/10 / swelling and bleeding    75 y.o. female presents with the above complaint. History confirmed with patient.   Objective:  Physical Exam: warm, good capillary refill, no trophic changes or ulcerative lesions, normal DP and PT pulses, and normal sensory exam. Left Foot: Paronychia with severe nail dystrophy and ingrowing border of hallux Assessment:   1. Ingrowing left great toenail      Plan:  Patient was evaluated and treated and all questions answered.    Ingrown Nail, left -Patient elects to proceed with minor surgery to remove ingrown toenail today. Consent reviewed and signed by patient. -Ingrown nail excised. See procedure note. -Educated on post-procedure care including soaking. Written instructions provided and reviewed. -Keflex prescription sent to pharmacy -Patient to follow up in 2 weeks for nail check.  Procedure: Excision of Ingrown Toenail Location: Left 1st toe  nail . Anesthesia: Lidocaine 1% plain; 1.5 mL and Marcaine 0.5% plain; 1.5 mL, digital block. Skin Prep: Betadine. Dressing: Silvadene; telfa; dry, sterile, compression dressing. Technique: Following skin prep, the toe was exsanguinated and a tourniquet was secured at the base of the toe. The affected nail was freed and excised.  The tourniquet was then removed and sterile dressing applied. Disposition: Patient tolerated procedure well. Patient to return in 2 weeks for follow-up.    Return in about 2 weeks (around 07/03/2021) for nail re-check.

## 2021-07-03 ENCOUNTER — Other Ambulatory Visit: Payer: Self-pay

## 2021-07-03 ENCOUNTER — Ambulatory Visit (INDEPENDENT_AMBULATORY_CARE_PROVIDER_SITE_OTHER): Payer: Medicare Other | Admitting: Podiatry

## 2021-07-03 DIAGNOSIS — L600 Ingrowing nail: Secondary | ICD-10-CM

## 2021-07-03 NOTE — Progress Notes (Signed)
  Subjective:  Patient ID: Elizabeth Jennings, female    DOB: [birth year],  MRN: [id number]  Chief Complaint  Patient presents with   Ingrown Toenail    Nail check left     75 y.o. female presents with the above complaint. History confirmed with patient. Doing well   Objective:  Physical Exam: warm, good capillary refill, no trophic changes or ulcerative lesions, normal DP and PT pulses, and normal sensory exam. Left Foot: matrixectomy site well healed  Assessment:   1. Ingrowing left great toenail       Plan:  Patient was evaluated and treated and all questions answered.    Ingrown Nail, left -Overall doing well can leave OTA and d/c ointments and bandaging   Return if symptoms worsen or fail to improve.

## 2021-08-03 ENCOUNTER — Telehealth: Payer: Self-pay

## 2021-08-03 NOTE — Telephone Encounter (Signed)
Called pt to schedule AWV, no answer, unable to leave voice msg. 

## 2021-08-12 ENCOUNTER — Other Ambulatory Visit: Payer: Self-pay | Admitting: Family Medicine

## 2021-11-05 ENCOUNTER — Other Ambulatory Visit: Payer: Self-pay | Admitting: Family Medicine

## 2021-11-05 DIAGNOSIS — I10 Essential (primary) hypertension: Secondary | ICD-10-CM

## 2021-11-26 ENCOUNTER — Other Ambulatory Visit: Payer: Self-pay | Admitting: Family Medicine

## 2021-11-26 DIAGNOSIS — Z1231 Encounter for screening mammogram for malignant neoplasm of breast: Secondary | ICD-10-CM

## 2021-12-12 DIAGNOSIS — H26493 Other secondary cataract, bilateral: Secondary | ICD-10-CM | POA: Diagnosis not present

## 2021-12-12 DIAGNOSIS — H524 Presbyopia: Secondary | ICD-10-CM | POA: Diagnosis not present

## 2021-12-12 DIAGNOSIS — H04123 Dry eye syndrome of bilateral lacrimal glands: Secondary | ICD-10-CM | POA: Diagnosis not present

## 2021-12-12 DIAGNOSIS — H35033 Hypertensive retinopathy, bilateral: Secondary | ICD-10-CM | POA: Diagnosis not present

## 2021-12-12 DIAGNOSIS — Z961 Presence of intraocular lens: Secondary | ICD-10-CM | POA: Diagnosis not present

## 2021-12-12 DIAGNOSIS — G510 Bell's palsy: Secondary | ICD-10-CM | POA: Diagnosis not present

## 2021-12-12 DIAGNOSIS — H353112 Nonexudative age-related macular degeneration, right eye, intermediate dry stage: Secondary | ICD-10-CM | POA: Diagnosis not present

## 2021-12-17 ENCOUNTER — Ambulatory Visit
Admission: RE | Admit: 2021-12-17 | Discharge: 2021-12-17 | Disposition: A | Discharge status: Home / Self Care | Payer: Medicare Other | Source: Ambulatory Visit | Attending: Family Medicine | Admitting: Family Medicine

## 2021-12-17 DIAGNOSIS — Z1231 Encounter for screening mammogram for malignant neoplasm of breast: Secondary | ICD-10-CM

## 2022-02-18 ENCOUNTER — Ambulatory Visit: Payer: Self-pay | Admitting: Nurse Practitioner

## 2022-02-28 ENCOUNTER — Ambulatory Visit (INDEPENDENT_AMBULATORY_CARE_PROVIDER_SITE_OTHER): Payer: Medicare Other | Admitting: Nurse Practitioner

## 2022-02-28 ENCOUNTER — Encounter: Payer: Self-pay | Admitting: Nurse Practitioner

## 2022-02-28 ENCOUNTER — Telehealth: Payer: Self-pay

## 2022-02-28 VITALS — BP 187/89 | HR 66 | Temp 98.3°F | Ht <= 58 in | Wt 167.0 lb

## 2022-02-28 DIAGNOSIS — Z8669 Personal history of other diseases of the nervous system and sense organs: Secondary | ICD-10-CM

## 2022-02-28 DIAGNOSIS — H6122 Impacted cerumen, left ear: Secondary | ICD-10-CM

## 2022-02-28 DIAGNOSIS — M1990 Unspecified osteoarthritis, unspecified site: Secondary | ICD-10-CM | POA: Diagnosis not present

## 2022-02-28 DIAGNOSIS — Z87898 Personal history of other specified conditions: Secondary | ICD-10-CM | POA: Diagnosis not present

## 2022-02-28 DIAGNOSIS — Z0000 Encounter for general adult medical examination without abnormal findings: Secondary | ICD-10-CM | POA: Diagnosis not present

## 2022-02-28 DIAGNOSIS — H60502 Unspecified acute noninfective otitis externa, left ear: Secondary | ICD-10-CM | POA: Diagnosis not present

## 2022-02-28 DIAGNOSIS — M810 Age-related osteoporosis without current pathological fracture: Secondary | ICD-10-CM

## 2022-02-28 DIAGNOSIS — R7303 Prediabetes: Secondary | ICD-10-CM

## 2022-02-28 DIAGNOSIS — Z9109 Other allergy status, other than to drugs and biological substances: Secondary | ICD-10-CM | POA: Diagnosis not present

## 2022-02-28 DIAGNOSIS — I10 Essential (primary) hypertension: Secondary | ICD-10-CM

## 2022-02-28 LAB — POCT GLYCOSYLATED HEMOGLOBIN (HGB A1C)
HbA1c POC (<> result, manual entry): 5.7 % (ref 4.0–5.6)
HbA1c, POC (controlled diabetic range): 5.7 % (ref 0.0–7.0)
HbA1c, POC (prediabetic range): 5.7 % (ref 5.7–6.4)
Hemoglobin A1C: 5.7 % — AB (ref 4.0–5.6)

## 2022-02-28 LAB — POCT URINALYSIS DIP (CLINITEK)
Bilirubin, UA: NEGATIVE
Blood, UA: NEGATIVE
Glucose, UA: NEGATIVE mg/dL
Ketones, POC UA: NEGATIVE mg/dL
Leukocytes, UA: NEGATIVE
Nitrite, UA: NEGATIVE
POC PROTEIN,UA: NEGATIVE
Spec Grav, UA: 1.02 (ref 1.010–1.025)
Urobilinogen, UA: 0.2 E.U./dL
pH, UA: 7 (ref 5.0–8.0)

## 2022-02-28 MED ORDER — NEOMYCIN-POLYMYXIN-HC 3.5-10000-1 OT SOLN: 3.0000 [drp] | Freq: Three times a day (TID) | OTIC | 0 refills | Status: DC

## 2022-02-28 MED ORDER — FLUTICASONE PROPIONATE 50 MCG/ACT NA SUSP: 2.0000 | Freq: Every day | NASAL | 6 refills | Status: DC

## 2022-02-28 MED ORDER — MELOXICAM 7.5 MG PO TABS: 7.5000 mg | ORAL_TABLET | Freq: Every day | ORAL | 1 refills | Status: DC

## 2022-02-28 MED ORDER — CETIRIZINE HCL 10 MG PO TABS: 10.0000 mg | ORAL_TABLET | Freq: Every day | ORAL | 3 refills | Status: DC

## 2022-02-28 MED ORDER — ALENDRONATE SODIUM 70 MG PO TABS: ORAL_TABLET | ORAL | 0 refills | Status: DC

## 2022-02-28 MED ORDER — OLOPATADINE HCL 0.2 % OP SOLN: 1.0000 [drp] | Freq: Every day | OPHTHALMIC | 2 refills | Status: DC

## 2022-02-28 NOTE — Progress Notes (Signed)
@Patient  ID: Orvan Seen, female    DOB: 02-Jan-1946, 76 y.o.   MRN: [id number] ? ?Chief Complaint  ?Patient presents with  ? Knee Pain  ?  Pt is here for physical. Pt stated both knees but the RT knee hurts more. Patient hasn't been taking her medication for knee pain or allergies she stated she didn't know she had it   ? ? ?Referring provider: ?Dorena Dew, FNP ? ?HPI ? ?Patient presents today for a physical.  She states that she does need refills on her medications.  She does complain today of right knee pain because she is out of her arthritis medications.  She also complains today of decreased hearing to her left ear.  She thinks that she may have earwax impaction.  Overall patient states that she has been doing well no other issues or concerns today.  We discussed that we will check labs today. ? ?Note: It was noted upon that patient's left ear was impacted with cerumen ear wash was performed office today around well.  All earwax was removed.  Ear canal was irritated from adhered wax. Will prescribe ar drops. ? ?Denies f/c/s, n/v/d, hemoptysis, PND, leg swelling ?Denies chest pain or edema ? ? ? ? ?Note: Patient did not take blood pressure medications today. She does have her medications with her in the office. Water was given for patient to take medications while in office today. ? ? ? ?No Known Allergies ? ?Immunization History  ?Administered Date(s) Administered  ? Influenza,inj,Quad PF,6+ Mos 12/13/2016, 12/11/2017, 12/03/2018, 12/14/2019  ? PFIZER(Purple Top)SARS-COV-2 Vaccination 01/21/2020, 02/15/2020, 01/18/2021  ? Pneumococcal Conjugate-13 04/10/2016, 12/11/2017  ? Pneumococcal Polysaccharide-23 06/05/2021  ? Tdap 04/10/2016  ? ? ?Past Medical History:  ?Diagnosis Date  ? Bell's palsy   ? Hemorrhoids   ? Hyperlipidemia   ? Hypertension   ? Osteoporosis   ? Prediabetes   ? ? ?Tobacco History: ?Social History  ? ?Tobacco Use  ?Smoking Status Never  ?Smokeless Tobacco Never  ? ?Counseling given:  Not Answered ? ? ?Outpatient Encounter Medications as of 02/28/2022  ?Medication Sig  ? acetaminophen (TYLENOL) 500 MG tablet Take 500 mg by mouth every 6 (six) hours as needed.  ? aspirin 81 MG tablet Take 81 mg by mouth daily.  ? carboxymethylcellulose (REFRESH PLUS) 0.5 % SOLN 1 drop 3 (three) times daily as needed.  ? hydroxypropyl methylcellulose / hypromellose (ISOPTO TEARS / GONIOVISC) 2.5 % ophthalmic solution Place 1 drop into the left eye 3 (three) times daily as needed for dry eyes.  ? ketorolac (ACULAR) 0.5 % ophthalmic solution 1 drop as needed (FOR IRRITATION).  ? losartan (COZAAR) 50 MG tablet TAKE 1 TABLET(50 MG) BY MOUTH DAILY  ? Multiple Vitamin (MULTIVITAMIN) capsule Take 1 capsule by mouth daily.  ? Multiple Vitamins-Minerals (ICAPS AREDS 2 PO) Take by mouth.  ? neomycin-polymyxin-hydrocortisone (CORTISPORIN) OTIC solution Place 3 drops into the left ear 3 (three) times daily.  ? Vitamin D, Ergocalciferol, (DRISDOL) 1.25 MG (50000 UNIT) CAPS capsule TAKE 1 CAPSULE BY MOUTH EVERY 7 DAYS  ? [DISCONTINUED] cetirizine (ZYRTEC ALLERGY) 10 MG tablet Take 1 tablet (10 mg total) by mouth daily.  ? [DISCONTINUED] fluticasone (FLONASE) 50 MCG/ACT nasal spray Place 2 sprays into both nostrils daily.  ? [DISCONTINUED] meloxicam (MOBIC) 7.5 MG tablet Take 1 tablet (7.5 mg total) by mouth daily.  ? [DISCONTINUED] Olopatadine HCl 0.2 % SOLN Apply 1 drop to eye daily.  ? alendronate (FOSAMAX) 70 MG tablet TAKE 1 TABLET BY  MOUTH ONCE A WEEK ON AN EMPTY STOMACH AND WITH A FULL GLASS OF WATER  ? cetirizine (ZYRTEC ALLERGY) 10 MG tablet Take 1 tablet (10 mg total) by mouth daily. (Patient not taking: Reported on 02/28/2022)  ? fluticasone (FLONASE) 50 MCG/ACT nasal spray Place 2 sprays into both nostrils daily. (Patient not taking: Reported on 02/28/2022)  ? meloxicam (MOBIC) 7.5 MG tablet Take 1 tablet (7.5 mg total) by mouth daily. (Patient not taking: Reported on 02/28/2022)  ? Olopatadine HCl 0.2 % SOLN Apply 1 drop  to eye daily.  ? prednisoLONE acetate (PRED FORTE) 1 % ophthalmic suspension 1 drop as needed (FOR irritation). (Patient not taking: Reported on 02/28/2022)  ? Selenium Sulfide 2.25 % SHAM Apply 1 application topically once a week. (Patient not taking: Reported on 02/28/2022)  ? [DISCONTINUED] alendronate (FOSAMAX) 70 MG tablet TAKE 1 TABLET BY MOUTH ONCE A WEEK ON AN EMPTY STOMACH AND WITH A FULL GLASS OF WATER  ? ?No facility-administered encounter medications on file as of 02/28/2022.  ? ? ? ?Review of Systems ? ?Review of Systems  ?Constitutional: Negative.   ?HENT:  Positive for hearing loss (left).   ?Cardiovascular: Negative.   ?Gastrointestinal: Negative.   ?Allergic/Immunologic: Negative.   ?Neurological: Negative.   ?Psychiatric/Behavioral: Negative.     ? ? ? ?Physical Exam ? ?BP (!) 187/89 (BP Location: Right Arm, Patient Position: Sitting, Cuff Size: Normal)   Pulse 66   Temp 98.3 ?F (36.8 ?C)   Ht 4\' 8"  (1.422 m)   Wt 167 lb (75.8 kg)   SpO2 100%   BMI 37.44 kg/m?  ? ?Wt Readings from Last 5 Encounters:  ?02/28/22 167 lb (75.8 kg)  ?06/05/21 169 lb 0.4 oz (76.7 kg)  ?04/24/21 167 lb 12.8 oz (76.1 kg)  ?01/16/21 163 lb (73.9 kg)  ?01/10/21 164 lb 6.4 oz (74.6 kg)  ? ? ? ?Physical Exam ?Vitals and nursing note reviewed.  ?Constitutional:   ?   General: She is not in acute distress. ?   Appearance: She is well-developed.  ?HENT:  ?   Right Ear: Hearing, tympanic membrane and ear canal normal.  ?   Left Ear: Decreased hearing noted. There is impacted cerumen.  ?Cardiovascular:  ?   Rate and Rhythm: Normal rate and regular rhythm.  ?Pulmonary:  ?   Effort: Pulmonary effort is normal.  ?   Breath sounds: Normal breath sounds.  ?Neurological:  ?   Mental Status: She is alert and oriented to person, place, and time.  ? ? ? ?Lab Results: ? ?CBC ?   ?Component Value Date/Time  ? WBC 6.9 11/26/2020 0021  ? RBC 4.53 11/26/2020 0021  ? HGB 15.0 11/26/2020 0047  ? HCT 44.0 11/26/2020 0047  ? PLT 207 11/26/2020  0021  ? MCV 97.4 11/26/2020 0021  ? MCH 31.6 11/26/2020 0021  ? MCHC 32.4 11/26/2020 0021  ? RDW 13.2 11/26/2020 0021  ? LYMPHSABS 2.7 11/26/2020 0021  ? MONOABS 0.6 11/26/2020 0021  ? EOSABS 0.1 11/26/2020 0021  ? BASOSABS 0.0 11/26/2020 0021  ? ? ?BMET ?   ?Component Value Date/Time  ? NA 143 04/24/2021 1358  ? K 4.2 04/24/2021 1358  ? CL 103 04/24/2021 1358  ? CO2 23 04/24/2021 1358  ? GLUCOSE 95 04/24/2021 1358  ? GLUCOSE 111 (H) 11/26/2020 0047  ? BUN 14 04/24/2021 1358  ? CREATININE 0.66 04/24/2021 1358  ? CREATININE 0.62 12/13/2016 1005  ? CALCIUM 9.4 04/24/2021 1358  ? GFRNONAA >60 11/26/2020  0021  ? GFRNONAA >89 12/13/2016 1005  ? GFRAA 104 02/22/2020 1157  ? GFRAA >89 12/13/2016 1005  ? ? ?BNP ?No results found for: BNP ? ?ProBNP ?No results found for: PROBNP ? ?Imaging: ?No results found. ? ? ?Assessment & Plan:  ? ?Arthritis ?- meloxicam (MOBIC) 7.5 MG tablet; Take 1 tablet (7.5 mg total) by mouth daily. (Patient not taking: Reported on 02/28/2022)  Dispense: 90 tablet; Refill: 1 ?- CBC ?- Comprehensive metabolic panel ?- TSH ? ?2. Environmental allergies ? ?- cetirizine (ZYRTEC ALLERGY) 10 MG tablet; Take 1 tablet (10 mg total) by mouth daily. (Patient not taking: Reported on 02/28/2022)  Dispense: 90 tablet; Refill: 3 ?- fluticasone (FLONASE) 50 MCG/ACT nasal spray; Place 2 sprays into both nostrils daily. (Patient not taking: Reported on 02/28/2022)  Dispense: 16 g; Refill: 6 ?- CBC ?- Comprehensive metabolic panel ?- Ambulatory referral to Allergy ? ?3. History of itching of eye ? ?- Olopatadine HCl 0.2 % SOLN; Apply 1 drop to eye daily.  Dispense: 2.5 mL; Refill: 2 ?- Ambulatory referral to Allergy ? ?4. Routine health maintenance ? ?- CBC ?- Comprehensive metabolic panel ?- HgB 123456 ?- TSH ? ?5. Hypertension, unspecified type ? ?- CBC ?- Comprehensive metabolic panel ?- TSH ? ?6. History of prediabetes ? ?- HgB A1c ? ?7. Prediabetes ? ?- HgB A1c ? ?8. Left ear impacted cerumen ? ? ?9. Osteoporosis,  unspecified osteoporosis type, unspecified pathological fracture presence ? ?- alendronate (FOSAMAX) 70 MG tablet; TAKE 1 TABLET BY MOUTH ONCE A WEEK ON AN EMPTY STOMACH AND WITH A FULL GLASS OF WATER  Dispense:

## 2022-02-28 NOTE — Telephone Encounter (Signed)
No additional notes needed  

## 2022-02-28 NOTE — Assessment & Plan Note (Signed)
-   meloxicam (MOBIC) 7.5 MG tablet; Take 1 tablet (7.5 mg total) by mouth daily. (Patient not taking: Reported on 02/28/2022)  Dispense: 90 tablet; Refill: 1 ?- CBC ?- Comprehensive metabolic panel ?- TSH ? ?2. Environmental allergies ? ?- cetirizine (ZYRTEC ALLERGY) 10 MG tablet; Take 1 tablet (10 mg total) by mouth daily. (Patient not taking: Reported on 02/28/2022)  Dispense: 90 tablet; Refill: 3 ?- fluticasone (FLONASE) 50 MCG/ACT nasal spray; Place 2 sprays into both nostrils daily. (Patient not taking: Reported on 02/28/2022)  Dispense: 16 g; Refill: 6 ?- CBC ?- Comprehensive metabolic panel ?- Ambulatory referral to Allergy ? ?3. History of itching of eye ? ?- Olopatadine HCl 0.2 % SOLN; Apply 1 drop to eye daily.  Dispense: 2.5 mL; Refill: 2 ?- Ambulatory referral to Allergy ? ?4. Routine health maintenance ? ?- CBC ?- Comprehensive metabolic panel ?- HgB A1c ?- TSH ? ?5. Hypertension, unspecified type ? ?- CBC ?- Comprehensive metabolic panel ?- TSH ? ?6. History of prediabetes ? ?- HgB A1c ? ?7. Prediabetes ? ?- HgB A1c ? ?8. Left ear impacted cerumen ? ? ?9. Osteoporosis, unspecified osteoporosis type, unspecified pathological fracture presence ? ?- alendronate (FOSAMAX) 70 MG tablet; TAKE 1 TABLET BY MOUTH ONCE A WEEK ON AN EMPTY STOMACH AND WITH A FULL GLASS OF WATER  Dispense: 12 tablet; Refill: 0 ? ? ?10. Acute otitis externa of left ear, unspecified type ?11. Impacted cerumen left - tolerated ear wash well - ear canal appeared irritated from adhered wax will order: ?- neomycin-polymyxin-hydrocortisone (CORTISPORIN) OTIC solution; Place 3 drops into the left ear 3 (three) times daily.  Dispense: 10 mL; Refill: 0 ? ? ?Follow up: ? ?Follow up in 3 months ? ?

## 2022-02-28 NOTE — Patient Instructions (Addendum)
1. Arthritis ? ?- meloxicam (MOBIC) 7.5 MG tablet; Take 1 tablet (7.5 mg total) by mouth daily. (Patient not taking: Reported on 02/28/2022)  Dispense: 90 tablet; Refill: 1 ?- CBC ?- Comprehensive metabolic panel ?- TSH ? ?2. Environmental allergies ? ?- cetirizine (ZYRTEC ALLERGY) 10 MG tablet; Take 1 tablet (10 mg total) by mouth daily. (Patient not taking: Reported on 02/28/2022)  Dispense: 90 tablet; Refill: 3 ?- fluticasone (FLONASE) 50 MCG/ACT nasal spray; Place 2 sprays into both nostrils daily. (Patient not taking: Reported on 02/28/2022)  Dispense: 16 g; Refill: 6 ?- CBC ?- Comprehensive metabolic panel ?- Ambulatory referral to Allergy ? ?3. History of itching of eye ? ?- Olopatadine HCl 0.2 % SOLN; Apply 1 drop to eye daily.  Dispense: 2.5 mL; Refill: 2 ?- Ambulatory referral to Allergy ? ?4. Routine health maintenance ? ?- CBC ?- Comprehensive metabolic panel ?- HgB 123456 ?- TSH ? ?5. Hypertension, unspecified type ? ?- CBC ?- Comprehensive metabolic panel ?- TSH ? ?6. History of prediabetes ? ?- HgB A1c ? ?7. Prediabetes ? ?- HgB A1c ? ?8. Left ear impacted cerumen ? ? ?9. Osteoporosis, unspecified osteoporosis type, unspecified pathological fracture presence ? ?- alendronate (FOSAMAX) 70 MG tablet; TAKE 1 TABLET BY MOUTH ONCE A WEEK ON AN EMPTY STOMACH AND WITH A FULL GLASS OF WATER  Dispense: 12 tablet; Refill: 0 ? ? ?10. Acute otitis externa of left ear, unspecified type ?11. Impacted cerumen left - tolerated ear wash well - ear canal appeared irritated from adhered wax will order: ?- neomycin-polymyxin-hydrocortisone (CORTISPORIN) OTIC solution; Place 3 drops into the left ear 3 (three) times daily.  Dispense: 10 mL; Refill: 0 ? ? ?Follow up: ? ?Follow up in 3 months ? ?

## 2022-03-01 LAB — COMPREHENSIVE METABOLIC PANEL
ALT: 40 IU/L — ABNORMAL HIGH (ref 0–32)
AST: 37 IU/L (ref 0–40)
Albumin/Globulin Ratio: 1.5 (ref 1.2–2.2)
Albumin: 4.4 g/dL (ref 3.7–4.7)
Alkaline Phosphatase: 115 IU/L (ref 44–121)
BUN/Creatinine Ratio: 28 (ref 12–28)
BUN: 17 mg/dL (ref 8–27)
Bilirubin Total: 0.3 mg/dL (ref 0.0–1.2)
CO2: 22 mmol/L (ref 20–29)
Calcium: 9 mg/dL (ref 8.7–10.3)
Chloride: 102 mmol/L (ref 96–106)
Creatinine, Ser: 0.61 mg/dL (ref 0.57–1.00)
Globulin, Total: 2.9 g/dL (ref 1.5–4.5)
Glucose: 78 mg/dL (ref 70–99)
Potassium: 3.5 mmol/L (ref 3.5–5.2)
Sodium: 142 mmol/L (ref 134–144)
Total Protein: 7.3 g/dL (ref 6.0–8.5)
eGFR: 94 mL/min/{1.73_m2} (ref >59)

## 2022-03-01 LAB — CBC
Hematocrit: 44.7 % (ref 34.0–46.6)
Hemoglobin: 14.9 g/dL (ref 11.1–15.9)
MCH: 31.8 pg (ref 26.6–33.0)
MCHC: 33.3 g/dL (ref 31.5–35.7)
MCV: 95 fL (ref 79–97)
Platelets: 205 10*3/uL (ref 150–450)
RBC: 4.69 x10E6/uL (ref 3.77–5.28)
RDW: 13.4 % (ref 11.7–15.4)
WBC: 7.2 10*3/uL (ref 3.4–10.8)

## 2022-03-01 LAB — TSH: TSH: 2.35 u[IU]/mL (ref 0.450–4.500)

## 2022-04-19 ENCOUNTER — Ambulatory Visit (INDEPENDENT_AMBULATORY_CARE_PROVIDER_SITE_OTHER): Payer: Medicare Other | Admitting: Internal Medicine

## 2022-04-19 ENCOUNTER — Encounter: Payer: Self-pay | Admitting: Internal Medicine

## 2022-04-19 VITALS — BP 152/78 | HR 82 | Temp 98.0°F | Resp 16 | Ht <= 58 in | Wt 166.0 lb

## 2022-04-19 DIAGNOSIS — J310 Chronic rhinitis: Secondary | ICD-10-CM

## 2022-04-19 DIAGNOSIS — H04123 Dry eye syndrome of bilateral lacrimal glands: Secondary | ICD-10-CM | POA: Diagnosis not present

## 2022-04-19 DIAGNOSIS — Z91038 Other insect allergy status: Secondary | ICD-10-CM

## 2022-04-19 MED ORDER — AZELASTINE HCL 0.1 % NA SOLN
2.0000 | Freq: Two times a day (BID) | NASAL | 12 refills | Status: DC
Start: 2022-04-19 — End: 2023-06-03

## 2022-04-19 NOTE — Patient Instructions (Signed)
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  - Stop taking: Zyrtec  - Continue with:  refresh eye drops  and Flonase (fluticasone) two sprays per nostril daily - Start taking: Astelin (azelastine) 2 sprays per nostril 1-2 times daily as needed - If no response we can consider ENT referral for other treatments for nonallergic symptoms    Dry Eyes -Continue refresh eyedrops for gentle lubrication -Given negative allergy testing would avoid allergy eyedrops as these can worsen dry eye syndrome -Follow-up with optometry to discuss prescription strength treatment for dry eyes   Bee Allergy  - You are having large local reactions to bee stings, this is a small allergic reaction and risk of more severe reactions is low - Based on symptoms, further testing or epipen is not indicated at this time  - for treatment you can use ice, NSAIDs and allegra 180mg  1-2 times a day until symptoms resolve   Follow up: 3 months   Thank you so much for letting me partake in your care today.  Don't hesitate to reach out if you have any additional concerns!  Roney Marion, MD  [facility] [place], [place]

## 2022-04-19 NOTE — Progress Notes (Signed)
New Patient Note  RE: Elizabeth Jennings MRN: [id number] DOB: 01/18/1946 Date of Office Visit: 04/19/2022  Consult requested by: Ivonne Andrew, NP Primary care provider: Massie Maroon, FNP  Chief Complaint: Allergic Rhinitis  (Itchy eyes, ears, throat, cough, dry throat. Body hurts when she is fighting allergies. Cetirizine 10 mg makes her feel jittery and nervous.)  History of Present Illness: I had the pleasure of seeing Elizabeth Jennings for initial evaluation at the [facility] on 04/19/2022. She is a 76 y.o. female, who is referred here by Massie Maroon, FNP for the evaluation of rhinitis .  History obtained from patient and  friend who helped interpreter .  Chronic rhinitis: started as young adult, has persisted throughout life Symptoms include:  dry cough, body aches, itchy throat, nasal congestion, rhinorrhea, sneezing, watery eyes, itchy eyes, and itchy nose  Occurs  worsens with URI, in spring and summer, improves in winter Potential triggers: pollen season Treatments tried: zyrtec (does not tolerate as it makes her "jittery") Previous allergy testing: no History of reflux/heartburn: no History of chronic sinusitis or sinus surgery: no Nonallergic triggers:  smoke     Assessment and Plan: Kattleya is a 76 y.o. 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  - Stop taking: Zyrtec  - Continue with:  refresh eye drops  and Flonase (fluticasone) two sprays per nostril daily - Start taking: Astelin (azelastine) 2 sprays per nostril 1-2 times daily as needed - If no response we can consider ENT referral for other treatments for nonallergic  symptoms    Dry Eyes -Continue refresh eyedrops for gentle lubrication -Given negative allergy testing would avoid allergy eyedrops as these can worsen dry eye syndrome -Follow-up with optometry to discuss prescription strength treatment for dry eyes   Bee Allergy  - You are having large local reactions to bee stings, this is a small allergic reaction and risk of more severe reactions is low - Based on symptoms, further testing or epipen is not indicated at this time  - for treatment you can use ice, NSAIDs and allegra 180mg  1-2 times a day until symptoms resolve   Follow up: 3 months   Thank you so much for letting me partake in your care today.  Don't hesitate to reach out if you have any additional concerns!  , MD  [facility] [place]  Return in about 3 months (around 07/20/2022).  Meds ordered this encounter  Medications   azelastine (ASTELIN) 0.1 % nasal spray    Sig: Place 2 sprays into both nostrils 2 (two) times daily. Use in each nostril as directed    Dispense:  30 mL    Refill:  12   Lab Orders  No laboratory test(s) ordered today    Other allergy screening: Asthma: no Rhino conjunctivitis: yes Food allergy: no Medication allergy: no Hymenoptera allergy:  LLR - to bee stings  Urticaria: no Eczema:no History of recurrent infections suggestive of immunodeficency: no  Diagnostics: Skin Testing: Environmental allergy panel. Skin prick and intradermals are negative to all environmentals  Results interpreted by myself and discussed with patient/family.  Airborne Adult Perc - 04/19/22 1530  Time Antigen Placed 1511    Allergen Manufacturer Greer    Location Back    Number of Test 59    1. Control-Buffer 50% Glycerol Negative    2. Control-Histamine 1 mg/ml 4+    3. Albumin saline Negative    4. Bahia Negative    5. French Southern Territories Negative    6. Johnson Negative    7. Kentucky Blue Negative    8. Meadow Fescue Negative     9. Perennial Rye Negative    10. Sweet Vernal Negative    11. Timothy Negative    12. Cocklebur Negative    13. Burweed Marshelder Negative    14. Ragweed, short Negative    15. Ragweed, Giant Negative    16. Plantain,  English Negative    17. Lamb's Quarters Negative    18. Sheep Sorrell Negative    19. Rough Pigweed Negative    20. Marsh Elder, Rough Negative    21. Mugwort, Common Negative    22. Ash mix Negative    23. Birch mix Negative    24. Beech American Negative    25. Box, Elder Negative    26. Cedar, red Negative    27. Cottonwood, Guinea-Bissau Negative    28. Elm mix Negative    29. Hickory Negative    30. Maple mix Negative    31. Oak, Guinea-Bissau mix Negative    32. Pecan Pollen Negative    33. Pine mix Negative    34. Sycamore Eastern Negative    35. Walnut, Black Pollen Negative    36. Alternaria alternata Negative    37. Cladosporium Herbarum Negative    38. Aspergillus mix Negative    39. Penicillium mix Negative    40. Bipolaris sorokiniana (Helminthosporium) Negative    41. Drechslera spicifera (Curvularia) Negative    43. Fusarium moniliforme Negative    44. Aureobasidium pullulans (pullulara) Negative    45. Rhizopus oryzae Negative    46. Botrytis cinera Negative    47. Epicoccum nigrum Negative    48. Phoma betae Negative    49. Candida Albicans Negative    50. Trichophyton mentagrophytes Negative    51. Mite, D Farinae  5,000 AU/ml Negative    52. Mite, D Pteronyssinus  5,000 AU/ml Negative    53. Cat Hair 10,000 BAU/ml Negative    54.  Dog Epithelia Negative    55. Mixed Feathers Negative    56. Horse Epithelia Negative    57. Cockroach, German Negative    58. Mouse Negative    59. Tobacco Leaf Negative             Intradermal - 04/19/22 1531     Time Antigen Placed 1531    Allergen Manufacturer Waynette Buttery    Location Arm    Number of Test 15    Control Negative    French Southern Territories Negative    Johnson Negative    7 Grass Negative    Ragweed mix  Negative    Weed mix Negative    Tree mix Negative    Mold 1 Negative    Mold 2 Negative    Mold 3 Negative    Mold 4 Negative    Cat Negative    Dog Negative    Cockroach Negative    Mite mix Negative             Past Medical History: Patient Active Problem List   Diagnosis Date Noted   Left-sided Bell's palsy 12/12/2020  Ptosis of left eyelid 12/12/2020   Left leg paresthesias 12/12/2020   Refusal of blood transfusions as patient is Jehovah's Witness 02/26/2017   Prediabetes 04/11/2016   Vitamin D deficiency 04/11/2016   Essential hypertension 04/10/2016   Arthritis 04/10/2016   Osteoporosis 04/10/2016   Obesity 04/10/2016   Hyperglycemia 04/10/2016   Environmental allergies 04/10/2016   Language barrier to communication 04/10/2016   Past Medical History:  Diagnosis Date   Bell's palsy    Hemorrhoids    Hyperlipidemia    Hypertension    Osteoporosis    Prediabetes    Past Surgical History: Past Surgical History:  Procedure Laterality Date   EYE SURGERY  2017   cataracts both eyes    EYE SURGERY  2018   eye lid surgery    Medication List:  Current Outpatient Medications  Medication Sig Dispense Refill   acetaminophen (TYLENOL) 500 MG tablet Take 500 mg by mouth every 6 (six) hours as needed.     aspirin 81 MG tablet Take 81 mg by mouth daily.     azelastine (ASTELIN) 0.1 % nasal spray Place 2 sprays into both nostrils 2 (two) times daily. Use in each nostril as directed 30 mL 12   carboxymethylcellulose (REFRESH PLUS) 0.5 % SOLN 1 drop 3 (three) times daily as needed.     cetirizine (ZYRTEC ALLERGY) 10 MG tablet Take 1 tablet (10 mg total) by mouth daily. 90 tablet 3   fluticasone (FLONASE) 50 MCG/ACT nasal spray Place 2 sprays into both nostrils daily. 16 g 6   losartan (COZAAR) 50 MG tablet TAKE 1 TABLET(50 MG) BY MOUTH DAILY 90 tablet 1   meloxicam (MOBIC) 7.5 MG tablet Take 1 tablet (7.5 mg total) by mouth daily. 90 tablet 1   Multiple Vitamin  (MULTIVITAMIN) capsule Take 1 capsule by mouth daily.     Multiple Vitamins-Minerals (ICAPS AREDS 2 PO) Take by mouth.     neomycin-polymyxin-hydrocortisone (CORTISPORIN) OTIC solution Place 3 drops into the left ear 3 (three) times daily. 10 mL 0   Selenium Sulfide 2.25 % SHAM Apply 1 application topically once a week. 1 Bottle 1   Vitamin D, Ergocalciferol, (DRISDOL) 1.25 MG (50000 UNIT) CAPS capsule TAKE 1 CAPSULE BY MOUTH EVERY 7 DAYS 8 capsule 0   alendronate (FOSAMAX) 70 MG tablet TAKE 1 TABLET BY MOUTH ONCE A WEEK ON AN EMPTY STOMACH AND WITH A FULL GLASS OF WATER (Patient not taking: Reported on 04/19/2022) 12 tablet 0   hydroxypropyl methylcellulose / hypromellose (ISOPTO TEARS / GONIOVISC) 2.5 % ophthalmic solution Place 1 drop into the left eye 3 (three) times daily as needed for dry eyes. (Patient not taking: Reported on 04/19/2022) 15 mL 12   ketorolac (ACULAR) 0.5 % ophthalmic solution 1 drop as needed (FOR IRRITATION). (Patient not taking: Reported on 04/19/2022)     Olopatadine HCl 0.2 % SOLN Apply 1 drop to eye daily. (Patient not taking: Reported on 04/19/2022) 2.5 mL 2   prednisoLONE acetate (PRED FORTE) 1 % ophthalmic suspension 1 drop as needed (FOR irritation). (Patient not taking: Reported on 02/28/2022)     No current facility-administered medications for this visit.   Allergies: No Known Allergies Social History: Social History   Socioeconomic History   Marital status: Divorced    Spouse name: Not on file   Number of children: Not on file   Years of education: Not on file   Highest education level: Not on file  Occupational History   Not on file  Tobacco Use   Smoking status: Never    Passive exposure: Never   Smokeless tobacco: Never  Vaping Use   Vaping Use: Never used  Substance and Sexual Activity   Alcohol use: No   Drug use: No   Sexual activity: Not Currently    Birth control/protection: None  Other Topics Concern   Not on file  Social History Narrative    Lives with son   Right Handed   Drinks no caffeien   Social Determinants of Health   Financial Resource Strain: Not on file  Food Insecurity: Not on file  Transportation Needs: Not on file  Physical Activity: Not on file  Stress: Not on file  Social Connections: Not on file   Lives in a apartment, that is 75 years old.  There are no roaches on the floor and bed is 2 feet off the floor.  They do not use dust mite precautions on bed and pillow.  She is not exposed to fumes, chemicals, dust at home or in job. Smoking: No smoking exposure Occupation: Currently retired  Landscape architectnvironmental History: ImmunologistWater Damage/mildew in the house: no Engineer, civil (consulting)Carpet in the family room: no Carpet in the bedroom: yes Heating: gas Cooling: central Pet: yes 1 dog with access to bedroom  Family History: Family History  Problem Relation Age of Onset   Breast cancer Neg Hx      ROS: All others negative except as noted per HPI.   Objective: BP (!) 152/78   Pulse 82   Temp 98 F (36.7 C) (Temporal)   Resp 16   Ht 4' 7.91" (1.42 m)   Wt 166 lb (75.3 kg)   SpO2 96%   BMI 37.34 kg/m  Body mass index is 37.34 kg/m.  General Appearance:  Alert, cooperative, no distress, appears stated age  Head:  Normocephalic, without obvious abnormality, atraumatic  Eyes:  Conjunctiva clear, EOM's intact  Nose: Nares normal,  erythematous nasal mucosa , hypertrophic turbinates, no visible anterior polyps, and septum midline  Throat: Lips, tongue normal; teeth and gums normal, normal posterior oropharynx and no tonsillar exudate  Neck: Supple, symmetrical  Lungs:   clear to auscultation bilaterally, Respirations unlabored, no coughing  Heart:  regular rate and rhythm and no murmur, Appears well perfused  Extremities: No edema  Skin: Skin color, texture, turgor normal, no rashes or lesions on visualized portions of skin  Neurologic: No gross deficits   The plan was reviewed with the patient/family, and all  questions/concerned were addressed.  It was my pleasure to see Elizabeth Payordith today and participate in her care. Please feel free to contact me with any questions or concerns.  Sincerely,  Ferol LuzEvelyn Astin Sayre, MD Allergy & Immunology  [facility] [place] [place] office: [phone] [facility] office: [phone]

## 2022-05-27 ENCOUNTER — Other Ambulatory Visit: Payer: Self-pay

## 2022-05-27 DIAGNOSIS — I10 Essential (primary) hypertension: Secondary | ICD-10-CM

## 2022-05-27 MED ORDER — LOSARTAN POTASSIUM 50 MG PO TABS: ORAL_TABLET | ORAL | 1 refills | Status: DC

## 2022-05-28 ENCOUNTER — Telehealth: Payer: Self-pay | Admitting: Family Medicine

## 2022-05-28 NOTE — Telephone Encounter (Signed)
[facility] refill requests: Vitamin D2 50,000IU Losartan 50mg   Pharmacy: [facility] [id number] 

## 2022-05-29 ENCOUNTER — Other Ambulatory Visit: Payer: Self-pay

## 2022-05-29 DIAGNOSIS — I10 Essential (primary) hypertension: Secondary | ICD-10-CM

## 2022-05-29 MED ORDER — VITAMIN D (ERGOCALCIFEROL) 1.25 MG (50000 UNIT) PO CAPS: 50000.0000 [IU] | ORAL_CAPSULE | ORAL | 0 refills | Status: DC

## 2022-05-29 MED ORDER — LOSARTAN POTASSIUM 50 MG PO TABS: ORAL_TABLET | ORAL | 1 refills | Status: DC

## 2022-05-31 ENCOUNTER — Ambulatory Visit
Admission: EM | Admit: 2022-05-31 | Discharge: 2022-05-31 | Disposition: A | Discharge status: Home / Self Care | Payer: Medicare Other | Attending: Emergency Medicine | Admitting: Emergency Medicine

## 2022-05-31 DIAGNOSIS — R1084 Generalized abdominal pain: Secondary | ICD-10-CM | POA: Insufficient documentation

## 2022-05-31 DIAGNOSIS — R42 Dizziness and giddiness: Secondary | ICD-10-CM | POA: Diagnosis not present

## 2022-05-31 LAB — POCT URINALYSIS DIP (MANUAL ENTRY)
Bilirubin, UA: NEGATIVE
Blood, UA: NEGATIVE
Glucose, UA: NEGATIVE mg/dL
Ketones, POC UA: NEGATIVE mg/dL
Nitrite, UA: NEGATIVE
Protein Ur, POC: NEGATIVE mg/dL
Spec Grav, UA: 1.015 (ref 1.010–1.025)
Urobilinogen, UA: 1 E.U./dL
pH, UA: 7 (ref 5.0–8.0)

## 2022-05-31 MED ORDER — SULFAMETHOXAZOLE-TRIMETHOPRIM 800-160 MG PO TABS: 1.0000 | ORAL_TABLET | Freq: Two times a day (BID) | ORAL | 0 refills | Status: AC

## 2022-05-31 NOTE — ED Provider Notes (Signed)
[facility]    CSN: [id number] Arrival date & time: 05/31/22  1446    HISTORY   Chief Complaint  Patient presents with   Abdominal Pain   Dizziness   HPI Elizabeth Jennings is a pleasant, 76 y.o. female who presents to urgent care today complaining of Abdominal pain and dizziness.  Patient is here with her daughter today who provides interpretation.  Daughter states patient has been having symptoms for the past 6 days, states that she has been complaining about abdominal pain that is achy in quality along with bloating and that she is also complained of having intermittent loose stool.  Daughter states patient has been taking Pepto-Bismol, last dose was 8 AM this morning.  Patient has elevated blood pressure on arrival, EMR reviewed, patient historically has elevated blood pressure.  Patient has been prescribed losartan for blood pressure, states she is taking it regularly.  Daughter states she lives with patient, states patient has not had any episodes of syncope or complained about severe headache.  The history is provided by the patient.   Past Medical History:  Diagnosis Date   Bell's palsy    Hemorrhoids    Hyperlipidemia    Hypertension    Osteoporosis    Prediabetes    Patient Active Problem List   Diagnosis Date Noted   Left-sided Bell's palsy 12/12/2020   Ptosis of left eyelid 12/12/2020   Left leg paresthesias 12/12/2020   Refusal of blood transfusions as patient is Jehovah's Witness 02/26/2017   Prediabetes 04/11/2016   Vitamin D deficiency 04/11/2016   Essential hypertension 04/10/2016   Arthritis 04/10/2016   Osteoporosis 04/10/2016   Obesity 04/10/2016   Hyperglycemia 04/10/2016   Environmental allergies 04/10/2016   Language barrier to communication 04/10/2016   Past Surgical History:  Procedure Laterality Date   EYE SURGERY  2017   cataracts both eyes    EYE SURGERY  2018   eye lid surgery    OB History   No obstetric history on file.     Home Medications    Prior to Admission medications   Medication Sig Start Date End Date Taking? Authorizing Provider  acetaminophen (TYLENOL) 500 MG tablet Take 500 mg by mouth every 6 (six) hours as needed.    [provider]  aspirin 81 MG tablet Take 81 mg by mouth daily.    [provider]  azelastine (ASTELIN) 0.1 % nasal spray Place 2 sprays into both nostrils 2 (two) times daily. Use in each nostril as directed 04/19/22   Ferol Luz, MD  carboxymethylcellulose (REFRESH PLUS) 0.5 % SOLN 1 drop 3 (three) times daily as needed.    [provider]  cetirizine (ZYRTEC ALLERGY) 10 MG tablet Take 1 tablet (10 mg total) by mouth daily. 02/28/22 02/28/23  Ivonne Andrew, NP  fluticasone (FLONASE) 50 MCG/ACT nasal spray Place 2 sprays into both nostrils daily. 02/28/22   Ivonne Andrew, NP  losartan (COZAAR) 50 MG tablet TAKE 1 TABLET(50 MG) BY MOUTH DAILY 05/29/22   Massie Maroon, FNP  meloxicam (MOBIC) 7.5 MG tablet Take 1 tablet (7.5 mg total) by mouth daily. 02/28/22   Ivonne Andrew, NP  Multiple Vitamin (MULTIVITAMIN) capsule Take 1 capsule by mouth daily.    [provider]  Multiple Vitamins-Minerals (ICAPS AREDS 2 PO) Take by mouth.    [provider]  neomycin-polymyxin-hydrocortisone (CORTISPORIN) OTIC solution Place 3 drops into the left ear 3 (three) times daily. 02/28/22   Angus Seller  S, NP  Selenium Sulfide 2.25 % SHAM Apply 1 application topically once a week. 12/11/17   Massie Maroon, FNP  Vitamin D, Ergocalciferol, (DRISDOL) 1.25 MG (50000 UNIT) CAPS capsule Take 1 capsule (50,000 Units total) by mouth every 7 (seven) days. 05/29/22   Massie Maroon, FNP    Family History Family History  Problem Relation Age of Onset   Breast cancer Neg Hx    Social History Social History   Tobacco Use   Smoking status: Never    Passive exposure: Never   Smokeless tobacco: Never  Vaping Use   Vaping Use: Never used   Substance Use Topics   Alcohol use: No   Drug use: No   Allergies   Patient has no known allergies.  Review of Systems Review of Systems Pertinent findings revealed after performing a 14 point review of systems has been noted in the history of present illness.  Physical Exam Triage Vital Signs ED Triage Vitals  Enc Vitals Group     BP 09/14/21 0827 (!) 147/82     Pulse Rate 09/14/21 0827 72     Resp 09/14/21 0827 18     Temp 09/14/21 0827 98.3 F (36.8 C)     Temp Source 09/14/21 0827 Oral     SpO2 09/14/21 0827 98 %     Weight --      Height --      Head Circumference --      Peak Flow --      Pain Score 09/14/21 0826 5     Pain Loc --      Pain Edu? --      Excl. in GC? --   No data found.  Updated Vital Signs BP (!) 157/74 (BP Location: Left Arm)   Pulse 64   Temp 99 F (37.2 C) (Oral)   Resp 18   SpO2 94%   Physical Exam Vitals and nursing note reviewed.  Constitutional:      General: She is awake. She is not in acute distress.    Appearance: Normal appearance. She is well-developed and well-groomed. She is morbidly obese. She is not ill-appearing.  HENT:     Head: Normocephalic and atraumatic.  Eyes:     General: Lids are normal.        Right eye: No discharge.        Left eye: No discharge.     Extraocular Movements: Extraocular movements intact.     Conjunctiva/sclera: Conjunctivae normal.     Right eye: Right conjunctiva is not injected.     Left eye: Left conjunctiva is not injected.  Neck:     Trachea: Trachea and phonation normal.  Cardiovascular:     Rate and Rhythm: Normal rate and regular rhythm.     Pulses: Normal pulses.     Heart sounds: Normal heart sounds. No murmur heard.    No friction rub. No gallop.  Pulmonary:     Effort: Pulmonary effort is normal. No accessory muscle usage, prolonged expiration or respiratory distress.     Breath sounds: Normal breath sounds. No stridor, decreased air movement or transmitted upper airway  sounds. No decreased breath sounds, wheezing, rhonchi or rales.  Chest:     Chest wall: No tenderness.  Abdominal:     General: Abdomen is flat. Bowel sounds are normal. There is no distension.     Palpations: Abdomen is soft.     Tenderness: There is generalized abdominal tenderness. There is no  right CVA tenderness or left CVA tenderness.     Hernia: No hernia is present.  Musculoskeletal:        General: Normal range of motion.     Cervical back: Normal range of motion and neck supple. Normal range of motion.  Lymphadenopathy:     Cervical: No cervical adenopathy.  Skin:    General: Skin is warm and dry.     Findings: No erythema or rash.  Neurological:     General: No focal deficit present.     Mental Status: She is alert and oriented to person, place, and time.  Psychiatric:        Mood and Affect: Mood normal.        Behavior: Behavior normal. Behavior is cooperative.     Visual Acuity Right Eye Distance:   Left Eye Distance:   Bilateral Distance:    Right Eye Near:   Left Eye Near:    Bilateral Near:     [facility] Couse / Diagnostics / Procedures:     Radiology No results found.  Procedures Procedures (including critical care time) EKG  Pending results:  Labs Reviewed  POCT URINALYSIS DIP (MANUAL ENTRY) - Abnormal; Notable for the following components:      Result Value   Clarity, UA hazy (*)    Leukocytes, UA Trace (*)    All other components within normal limits  URINE CULTURE    Medications Ordered in UC: Medications - No data to display  UC Diagnoses / Final Clinical Impressions(s)   I have reviewed the triage vital signs and the nursing notes.  Pertinent labs & imaging results that were available during my care of the patient were reviewed by me and considered in my medical decision making (see chart for details).    Final diagnoses:  Generalized abdominal pain  Dizziness   Physical exam is nonspecific and for the most part unremarkable.   Urinalysis revealed cloudy urine with small amount of leukocytosis.  We will start patient empirically on antibiotics for presumed urinary tract infection while we await results of urine culture.  Adjust treatment as needed based on results of urine culture.  ED Prescriptions     Medication Sig Dispense Auth. Provider   sulfamethoxazole-trimethoprim (BACTRIM DS) 800-160 MG tablet Take 1 tablet by mouth 2 (two) times daily for 3 days. 6 tablet Theadora Rama Scales, PA-C      PDMP not reviewed this encounter.  Disposition Upon Discharge:  Condition: stable for discharge home  Patient presented with concern for an acute illness with associated systemic symptoms and significant discomfort requiring urgent management. In my opinion, this is a condition that a prudent lay person (someone who possesses an average knowledge of health and medicine) may potentially expect to result in complications if not addressed urgently such as respiratory distress, impairment of bodily function or dysfunction of bodily organs.   As such, the patient has been evaluated and assessed, work-up was performed and treatment was provided in alignment with urgent care protocols and evidence based medicine.  Patient/parent/caregiver has been advised that the patient may require follow up for further testing and/or treatment if the symptoms continue in spite of treatment, as clinically indicated and appropriate.  Routine symptom specific, illness specific and/or disease specific instructions were discussed with the patient and/or caregiver at length.  Prevention strategies for avoiding STD exposure were also discussed.  The patient will follow up with their current PCP if and as advised. If the patient does not currently  have a PCP we will assist them in obtaining one.   The patient may need specialty follow up if the symptoms continue, in spite of conservative treatment and management, for further workup, evaluation,  consultation and treatment as clinically indicated and appropriate.  Patient/parent/caregiver verbalized understanding and agreement of plan as discussed.  All questions were addressed during visit.  Please see discharge instructions below for further details of plan.  Discharge Instructions:   Discharge Instructions      The urinalysis that we performed in the clinic today was abnormal.  Urine culture will be performed per our protocol.  The result of the urine culture will be available in the next 3 to 5 days and will be posted to your MyChart account.   Because you are having symptoms of dizziness and abdominal pain, I recommend that you begin antibiotics now for presumed urinary tract infection.  Please take 1 tablet twice daily for the next 3 days.  Once we receive the results of your urine culture, we will reach out to you and advise you if any further antibiotics are needed.  In the meantime, please avoid taking medications such as Pepto-Bismol as they can sometimes make symptoms of upset stomach worse.   If you have not had complete resolution of your symptoms after completing treatment as prescribed, please return to urgent care for repeat evaluation or follow-up with your primary care provider.   Thank you for visiting urgent care today.  I appreciate the opportunity to participate in your care.       This office note has been dictated using Teaching laboratory technician.  Unfortunately, this method of dictation can sometimes lead to typographical or grammatical errors.  I apologize for your inconvenience in advance if this occurs.  Please do not hesitate to reach out to me if clarification is needed.       Theadora Rama Scales, PA-C 05/31/22 1540

## 2022-05-31 NOTE — Discharge Instructions (Addendum)
The urinalysis that we performed in the clinic today was abnormal.  Urine culture will be performed per our protocol.  The result of the urine culture will be available in the next 3 to 5 days and will be posted to your MyChart account.   Because you are having symptoms of dizziness and abdominal pain, I recommend that you begin antibiotics now for presumed urinary tract infection.  Please take 1 tablet twice daily for the next 3 days.  Once we receive the results of your urine culture, we will reach out to you and advise you if any further antibiotics are needed.  In the meantime, please avoid taking medications such as Pepto-Bismol as they can sometimes make symptoms of upset stomach worse.   If you have not had complete resolution of your symptoms after completing treatment as prescribed, please return to urgent care for repeat evaluation or follow-up with your primary care provider.   Thank you for visiting urgent care today.  I appreciate the opportunity to participate in your care.

## 2022-05-31 NOTE — ED Triage Notes (Addendum)
Since Saturday the patient has been feeling fatigued, having dizziness, abd pain (aching and bloating), and loose stool.   Home interventions: bismuth subsalicylate (last taken ~ 0800 today)

## 2022-06-01 LAB — URINE CULTURE: Culture: NO GROWTH

## 2022-06-04 ENCOUNTER — Ambulatory Visit (HOSPITAL_COMMUNITY)
Admission: RE | Admit: 2022-06-04 | Discharge: 2022-06-04 | Disposition: A | Discharge status: Home / Self Care | Payer: Medicare Other | Source: Ambulatory Visit | Attending: Family Medicine | Admitting: Family Medicine

## 2022-06-04 ENCOUNTER — Ambulatory Visit (INDEPENDENT_AMBULATORY_CARE_PROVIDER_SITE_OTHER): Payer: Medicare Other | Admitting: Family Medicine

## 2022-06-04 ENCOUNTER — Encounter: Payer: Self-pay | Admitting: Family Medicine

## 2022-06-04 VITALS — BP 128/69 | HR 72 | Temp 98.0°F | Resp 18 | Ht <= 58 in | Wt 163.2 lb

## 2022-06-04 DIAGNOSIS — M25561 Pain in right knee: Secondary | ICD-10-CM

## 2022-06-04 DIAGNOSIS — R7303 Prediabetes: Secondary | ICD-10-CM

## 2022-06-04 DIAGNOSIS — G8929 Other chronic pain: Secondary | ICD-10-CM | POA: Diagnosis not present

## 2022-06-04 DIAGNOSIS — I10 Essential (primary) hypertension: Secondary | ICD-10-CM | POA: Diagnosis not present

## 2022-06-04 DIAGNOSIS — Z6837 Body mass index (BMI) 37.0-37.9, adult: Secondary | ICD-10-CM | POA: Diagnosis not present

## 2022-06-04 DIAGNOSIS — R42 Dizziness and giddiness: Secondary | ICD-10-CM | POA: Diagnosis not present

## 2022-06-04 DIAGNOSIS — Z789 Other specified health status: Secondary | ICD-10-CM | POA: Diagnosis not present

## 2022-06-04 NOTE — Progress Notes (Signed)
[facility]   Established Patient Office Visit  Subjective   Patient ID: Elizabeth Jennings, female    DOB: 09/22/1946  Age: 76 y.o. MRN: [id number]  Chief Complaint  Patient presents with   Follow-up    Pt is here for 3 months follow up visit. Pt stated she went to urgent care was told she has a UTI pt states she finished all her anabiotics, also pt has pain in RT knee pain cramping, burning,swollen.     Elizabeth Jennings is a 76 year old female that presents accompanied by interpreter with medical history significant for essential hypertension, left-sided Bell's palsy, hyperlipidemia, osteoarthritis, and prediabetes that presents for follow-up of chronic conditions.  Patient's primary complaint on today is worsening right knee pain.  Pain intensity increases with prolonged standing and when transitioning from sitting to standing.  Patient denies any recent falls or injuries.  She has been taking over-the-counter Tylenol without very much relief.  Also, patient has not taken meloxicam over the past several months.  Hypertension The problem is controlled. Pertinent negatives include no blurred vision, chest pain, headaches, orthopnea, palpitations or shortness of breath. Risk factors for coronary artery disease include obesity and sedentary lifestyle. There is no history of kidney disease or CAD/MI. There is no history of chronic renal disease.  Knee Pain  There was no injury mechanism. The pain is present in the right knee. The quality of the pain is described as aching. The pain is at a severity of 6/10. The pain is moderate. The symptoms are aggravated by movement and weight bearing. She has tried heat, acetaminophen and NSAIDs for the symptoms. The treatment provided no relief.  Hyperlipidemia This is a chronic problem. The problem is controlled. Recent lipid tests were reviewed and are variable. Exacerbating diseases include obesity. She has no  history of chronic renal disease or liver disease. Pertinent negatives include no chest pain or shortness of breath.    Patient Active Problem List   Diagnosis Date Noted   Left-sided Bell's palsy 12/12/2020   Ptosis of left eyelid 12/12/2020   Left leg paresthesias 12/12/2020   Refusal of blood transfusions as patient is Jehovah's Witness 02/26/2017   Prediabetes 04/11/2016   Vitamin D deficiency 04/11/2016   Essential hypertension 04/10/2016   Arthritis 04/10/2016   Osteoporosis 04/10/2016   Obesity 04/10/2016   Hyperglycemia 04/10/2016   Environmental allergies 04/10/2016   Language barrier to communication 04/10/2016   Past Medical History:  Diagnosis Date   Bell's palsy    Hemorrhoids    Hyperlipidemia    Hypertension    Osteoporosis    Prediabetes    Past Surgical History:  Procedure Laterality Date   EYE SURGERY  2017   cataracts both eyes    EYE SURGERY  2018   eye lid surgery    Social History   Tobacco Use   Smoking status: Never    Passive exposure: Never   Smokeless tobacco: Never  Vaping Use   Vaping Use: Never used  Substance Use Topics   Alcohol use: No   Drug use: No   Social History   Socioeconomic History   Marital status: Divorced    Spouse name: Not on file   Number of children: Not on file   Years of education: Not on file   Highest education level: Not on file  Occupational History   Not on file  Tobacco Use   Smoking status: Never    Passive exposure:  Never   Smokeless tobacco: Never  Vaping Use   Vaping Use: Never used  Substance and Sexual Activity   Alcohol use: No   Drug use: No   Sexual activity: Not Currently    Birth control/protection: None  Other Topics Concern   Not on file  Social History Narrative   Lives with son   Right Handed   Drinks no caffeien   Social Determinants of Health   Financial Resource Strain: Not on file  Food Insecurity: Not on file  Transportation Needs: Not on file  Physical  Activity: Not on file  Stress: Not on file  Social Connections: Not on file  Intimate Partner Violence: Not on file   Family Status  Relation Name Status   Neg Hx  (Not Specified)   Family History  Problem Relation Age of Onset   Breast cancer Neg Hx    No Known Allergies    Review of Systems  Constitutional: Negative.   HENT: Negative.    Eyes: Negative.  Negative for blurred vision.  Respiratory: Negative.  Negative for shortness of breath.   Cardiovascular: Negative.  Negative for chest pain, palpitations and orthopnea.  Gastrointestinal: Negative.   Genitourinary: Negative.   Musculoskeletal: Negative.   Skin: Negative.   Neurological: Negative.  Negative for headaches.  Psychiatric/Behavioral: Negative.        Objective:     BP 128/69 (BP Location: Left Arm, Patient Position: Sitting, Cuff Size: Normal)   Pulse 72   Temp 98 F (36.7 C)   Resp 18   Ht _0  (1.422 m)   Wt 163 lb 4 oz (74 kg)   SpO2 96%   BMI 36.60 kg/m  BP Readings from Last 3 Encounters:  06/04/22 128/69  05/31/22 (!) 157/74  04/19/22 (!) 152/78   Wt Readings from Last 3 Encounters:  06/04/22 163 lb 4 oz (74 kg)  04/19/22 166 lb (75.3 kg)  02/28/22 167 lb (75.8 kg)      Physical Exam Constitutional:      Appearance: Normal appearance. She is obese.  Eyes:     Pupils: Pupils are equal, round, and reactive to light.  Cardiovascular:     Rate and Rhythm: Normal rate and regular rhythm.     Pulses: Normal pulses.  Pulmonary:     Effort: Pulmonary effort is normal.  Abdominal:     General: Bowel sounds are normal.  Skin:    General: Skin is warm.  Neurological:     General: No focal deficit present.     Mental Status: She is alert. Mental status is at baseline.  Psychiatric:        Jennings and Affect: Jennings normal.        Behavior: Behavior normal.        Thought Content: Thought content normal.        Judgment: Judgment normal.      No results found for any visits on  06/04/22.  Last CBC Lab Results  Component Value Date   WBC 7.2 02/28/2022   HGB 14.9 02/28/2022   HCT 44.7 02/28/2022   MCV 95 02/28/2022   MCH 31.8 02/28/2022   RDW 13.4 02/28/2022   PLT 205 88/82/8003   Last metabolic panel Lab Results  Component Value Date   GLUCOSE 78 02/28/2022   NA 142 02/28/2022   K 3.5 02/28/2022   CL 102 02/28/2022   CO2 22 02/28/2022   BUN 17 02/28/2022   CREATININE 0.61 02/28/2022  EGFR 94 02/28/2022   CALCIUM 9.0 02/28/2022   PROT 7.3 02/28/2022   ALBUMIN 4.4 02/28/2022   LABGLOB 2.9 02/28/2022   AGRATIO 1.5 02/28/2022   BILITOT 0.3 02/28/2022   ALKPHOS 115 02/28/2022   AST 37 02/28/2022   ALT 40 (H) 02/28/2022   ANIONGAP 10 11/26/2020   Last lipids Lab Results  Component Value Date   CHOL 158 01/16/2021   HDL 52 01/16/2021   LDLCALC 80 01/16/2021   TRIG 150 (H) 01/16/2021   CHOLHDL 3.0 01/16/2021   Last hemoglobin A1c Lab Results  Component Value Date   HGBA1C 5.7 (A) 02/28/2022   HGBA1C 5.7 02/28/2022   HGBA1C 5.7 02/28/2022   HGBA1C 5.7 02/28/2022   Last thyroid functions Lab Results  Component Value Date   TSH 2.350 02/28/2022   Last vitamin D Lab Results  Component Value Date   VD25OH 36.7 12/14/2019   Last vitamin B12 and Folate Lab Results  Component Value Date   VITAMINB12 376 02/22/2020      The 10-year ASCVD risk score (Arnett DK, et al., 2019) is: 18.5%    Assessment & Plan:   Problem List Items Addressed This Visit       Other   Prediabetes - Primary   Relevant Orders   HgB A1c   Other Visit Diagnoses     Dizziness       Relevant Orders   Orthostatic vital signs   Chronic pain of right knee       Relevant Orders   DG Knee Complete 4 Views Right      1. Prediabetes Will follow up by phone with laboratory results.  Recommend a low-fat, low carbohydrate diet divided over small meals the - HgB A1c  2. Dizziness Today.  Patient is not experiencing dizziness on today.  Orthostatic  vital signs within normal range. - Orthostatic vital signs  3. Chronic pain of right knee Patient has not had any recent imaging of right knee.  We will send for right knee x-ray.  If there are any abnormal findings, will send a referral to orthopedic services for further work-up and evaluation. - DG Knee Complete 4 Views Right; Future   4. Essential hypertension BP 128/69 (BP Location: Left Arm, Patient Position: Sitting, Cuff Size: Normal)   Pulse 72   Temp 98 F (36.7 C)   Resp 18   Ht _0  (1.422 m)   Wt 163 lb 4 oz (74 kg)   SpO2 96%   BMI 36.60 kg/m  - Continue medication, monitor blood pressure at home. Continue DASH diet.  Reminder to go to the ER if any CP, SOB, nausea, dizziness, severe HA, changes vision/speech, left arm numbness and tingling and jaw pain.  5. Language barrier to communication Patient accompanied by interpreter, but understands English very well.   6. Class 2 severe obesity due to excess calories with serious comorbidity and body mass index (BMI) of 37.0 to 37.9 in adult [facility]) The patient is asked to make an attempt to improve diet and exercise patterns to aid in medical management of this problem.    Return in about 3 months (around 09/04/2022) for hyperlipidemia, hypertension.   Donia Pounds  APRN, MSN, FNP-C [facility] [address] [place], [place] [address] [phone]

## 2022-06-13 ENCOUNTER — Telehealth: Payer: Self-pay | Admitting: Family Medicine

## 2022-06-13 NOTE — Telephone Encounter (Signed)
Elizabeth Jennings - Patient's interpreter (Spanish) left a VM on the nurse line requesting x-ray results and follow up on her referral for knee pain. [phone]

## 2022-06-14 ENCOUNTER — Other Ambulatory Visit: Payer: Self-pay | Admitting: Family Medicine

## 2022-06-14 DIAGNOSIS — G8929 Other chronic pain: Secondary | ICD-10-CM

## 2022-06-14 NOTE — Progress Notes (Signed)
Orders Placed This Encounter  Procedures   AMB referral to orthopedics    Referral Priority:   Routine    Referral Type:   Consultation    Number of Visits Requested:   1

## 2022-06-14 NOTE — Progress Notes (Signed)
Please inform patient that right knee x-ray is consistent with degenerative arthritis.  Our referral will be sent to orthopedic services for further work-up and evaluation.  In the meantime, continue meloxicam 7.5 mg daily.  Nolon Nations  APRN, MSN, FNP-C [facility] [address] [place], [place] [address] [phone]

## 2022-06-17 ENCOUNTER — Other Ambulatory Visit: Payer: Self-pay | Admitting: Family Medicine

## 2022-06-20 ENCOUNTER — Ambulatory Visit: Payer: Medicare Other | Admitting: Orthopaedic Surgery

## 2022-06-21 ENCOUNTER — Encounter: Payer: Self-pay | Admitting: Orthopaedic Surgery

## 2022-06-21 ENCOUNTER — Ambulatory Visit (INDEPENDENT_AMBULATORY_CARE_PROVIDER_SITE_OTHER): Payer: Medicare Other | Admitting: Orthopaedic Surgery

## 2022-06-21 DIAGNOSIS — M1711 Unilateral primary osteoarthritis, right knee: Secondary | ICD-10-CM | POA: Diagnosis not present

## 2022-06-21 MED ORDER — LIDOCAINE HCL 1 % IJ SOLN
2.0000 mL | INTRAMUSCULAR | Status: AC | PRN
  Administered 2022-06-21: 2 mL

## 2022-06-21 MED ORDER — BUPIVACAINE HCL 0.25 % IJ SOLN
2.0000 mL | INTRAMUSCULAR | Status: AC | PRN
  Administered 2022-06-21: 2 mL via INTRA_ARTICULAR

## 2022-06-21 MED ORDER — TRAMADOL HCL 50 MG PO TABS: 50.0000 mg | ORAL_TABLET | Freq: Two times a day (BID) | ORAL | 2 refills | Status: DC | PRN

## 2022-06-21 MED ORDER — METHYLPREDNISOLONE ACETATE 40 MG/ML IJ SUSP
40.0000 mg | INTRAMUSCULAR | Status: AC | PRN
  Administered 2022-06-21: 40 mg via INTRA_ARTICULAR

## 2022-06-21 NOTE — Progress Notes (Signed)
Office Visit Note   Patient: Elizabeth Jennings           Date of Birth: [birth year]           MRN: [id number] Visit Date: 06/21/2022              Requested by: Massie Maroon, FNP [address] [place],  [place] [address] PCP: Massie Maroon, FNP   Assessment & Plan: Visit Diagnoses:  1. Primary osteoarthritis of right knee     Plan: Impression is right knee advanced degenerative joint disease.  Today, we had a discussion about various treatment options to include intra-articular cortisone injection.  She would like to proceed today.  We have also discussed proceeding with total knee arthroplasty at some point in the future.  She will follow-up with [place] as needed.  Follow-Up Instructions: Return if symptoms worsen or fail to improve.   Orders:  Orders Placed This Encounter  Procedures   Large Joint Inj: R knee   Meds ordered this encounter  Medications   traMADol (ULTRAM) 50 MG tablet    Sig: Take 1 tablet (50 mg total) by mouth every 12 (twelve) hours as needed.    Dispense:  30 tablet    Refill:  2      Procedures: Large Joint Inj: R knee on 06/21/2022 10:17 AM Indications: pain Details: 22 G needle, anterolateral approach Medications: 2 mL lidocaine 1 %; 2 mL bupivacaine 0.25 %; 40 mg methylPREDNISolone acetate 40 MG/ML      Clinical Data: No additional findings.   Subjective: Chief Complaint  Patient presents with   Right Knee - Pain    HPI patient is a pleasant 76 year old female who comes in today with right knee pain for the past 4 weeks.  She denies any injury or change in activity.  She has pain to the entire knee which occurs not only with rest at night but with activity and with rain.  She has been doing meloxicam without relief.  No previous cortisone injection to the right knee.  Review of Systems as detailed in HPI.  All others reviewed and are negative.   Objective: Vital Signs: There were no vitals taken for this visit.  Physical  Exam well-developed well-nourished female no acute distress.  Alert and oriented x3.  Ortho Exam right knee exam shows trace effusion.  Range of motion 0 to 110 degrees.  Medial and lateral joint line tenderness.  Marked L femoral crepitus.  She is neurovascular tact distally.  Specialty Comments:  No specialty comments available.  Imaging: Previous x-rays reviewed by me in canopy show marked degenerative changes the medial patellofemoral compartments   PMFS History: Patient Active Problem List   Diagnosis Date Noted   Primary osteoarthritis of right knee 06/21/2022   Chronic pain of right knee 06/14/2022   Left-sided Bell's palsy 12/12/2020   Ptosis of left eyelid 12/12/2020   Left leg paresthesias 12/12/2020   Refusal of blood transfusions as patient is Jehovah's Witness 02/26/2017   Prediabetes 04/11/2016   Vitamin D deficiency 04/11/2016   Essential hypertension 04/10/2016   Arthritis 04/10/2016   Osteoporosis 04/10/2016   Obesity 04/10/2016   Hyperglycemia 04/10/2016   Environmental allergies 04/10/2016   Language barrier to communication 04/10/2016   Past Medical History:  Diagnosis Date   Bell's palsy    Hemorrhoids    Hyperlipidemia    Hypertension    Osteoporosis    Prediabetes     Family History  Problem Relation Age of Onset   Breast cancer Neg Hx     Past Surgical History:  Procedure Laterality Date   EYE SURGERY  2017   cataracts both eyes    EYE SURGERY  2018   eye lid surgery    Social History   Occupational History   Not on file  Tobacco Use   Smoking status: Never    Passive exposure: Never   Smokeless tobacco: Never  Vaping Use   Vaping Use: Never used  Substance and Sexual Activity   Alcohol use: No   Drug use: No   Sexual activity: Not Currently    Birth control/protection: None

## 2022-07-19 ENCOUNTER — Ambulatory Visit: Payer: Medicare Other | Admitting: Internal Medicine

## 2022-08-09 ENCOUNTER — Ambulatory Visit: Payer: Medicare Other | Admitting: Internal Medicine

## 2022-08-16 ENCOUNTER — Ambulatory Visit: Payer: Medicare Other | Admitting: Internal Medicine

## 2022-09-04 DIAGNOSIS — Z23 Encounter for immunization: Secondary | ICD-10-CM | POA: Diagnosis not present

## 2022-09-25 ENCOUNTER — Telehealth: Payer: Self-pay

## 2022-09-25 NOTE — Telephone Encounter (Signed)
Called pt to advised that she will need to have labs drawn per [place] to continue on her vitamin D 50,000. No answer and lvm. kh

## 2022-09-25 NOTE — Telephone Encounter (Signed)
-----   Message from Massie Maroon, [place] sent at 09/25/2022  9:20 AM EST ----- Let's bring her in for labs. She may no longer need this high dose. Thanks  Nolon Nations  APRN, MSN, FNP-C [facility] [address] [place], [place] [address] [phone]   ----- Message ----- From: Renelda Loma, RMA Sent: 09/24/2022   7:32 AM EST To: Massie Maroon, FNP  Please advise if you would like to continue pt on 50,000 iu of vitamin d. Next ov is 11/2022. Kh ----- Message ----- From: Cresenciano Genre Sent: 09/23/2022   3:19 PM EST To: Renelda Loma, RMA; Shelly Bombard, CMA

## 2022-10-01 ENCOUNTER — Other Ambulatory Visit: Payer: Self-pay | Admitting: Nurse Practitioner

## 2022-10-01 DIAGNOSIS — Z9109 Other allergy status, other than to drugs and biological substances: Secondary | ICD-10-CM

## 2022-10-08 ENCOUNTER — Other Ambulatory Visit: Payer: Self-pay

## 2022-10-08 DIAGNOSIS — Z9109 Other allergy status, other than to drugs and biological substances: Secondary | ICD-10-CM

## 2022-10-08 MED ORDER — FLUTICASONE PROPIONATE 50 MCG/ACT NA SUSP: 2.0000 | Freq: Every day | NASAL | 0 refills | Status: AC

## 2022-11-25 ENCOUNTER — Other Ambulatory Visit: Payer: Self-pay

## 2022-11-25 MED ORDER — VITAMIN D (ERGOCALCIFEROL) 1.25 MG (50000 UNIT) PO CAPS
50000.0000 [IU] | ORAL_CAPSULE | ORAL | 0 refills | Status: DC
Start: 2022-11-25 — End: 2023-01-20

## 2022-11-25 NOTE — Telephone Encounter (Signed)
[facility] is requesting to fill pt vitamin d 50,000. Please advise [place].

## 2022-12-01 ENCOUNTER — Other Ambulatory Visit: Payer: Self-pay | Admitting: Nurse Practitioner

## 2022-12-01 DIAGNOSIS — I10 Essential (primary) hypertension: Secondary | ICD-10-CM

## 2022-12-03 ENCOUNTER — Other Ambulatory Visit: Payer: Self-pay

## 2022-12-03 DIAGNOSIS — I10 Essential (primary) hypertension: Secondary | ICD-10-CM

## 2022-12-03 MED ORDER — LOSARTAN POTASSIUM 50 MG PO TABS: ORAL_TABLET | ORAL | 1 refills | Status: DC

## 2022-12-10 ENCOUNTER — Ambulatory Visit: Payer: Self-pay | Admitting: Family Medicine

## 2022-12-18 ENCOUNTER — Ambulatory Visit: Payer: Self-pay | Admitting: Nurse Practitioner

## 2022-12-18 ENCOUNTER — Ambulatory Visit
Admission: EM | Admit: 2022-12-18 | Discharge: 2022-12-18 | Disposition: A | Discharge status: Home / Self Care | Payer: Medicare Other | Attending: Nurse Practitioner | Admitting: Nurse Practitioner

## 2022-12-18 DIAGNOSIS — L03211 Cellulitis of face: Secondary | ICD-10-CM

## 2022-12-18 MED ORDER — CEFTRIAXONE SODIUM 1 G IJ SOLR
1.0000 g | Freq: Once | INTRAMUSCULAR | Status: AC
  Administered 2022-12-18: 1 g via INTRAMUSCULAR

## 2022-12-18 MED ORDER — DOXYCYCLINE HYCLATE 100 MG PO CAPS: 100.0000 mg | ORAL_CAPSULE | Freq: Two times a day (BID) | ORAL | 0 refills | Status: AC

## 2022-12-18 MED ORDER — DEXAMETHASONE SODIUM PHOSPHATE 10 MG/ML IJ SOLN
6.0000 mg | Freq: Once | INTRAMUSCULAR | Status: AC
  Administered 2022-12-18: 6 mg via INTRAMUSCULAR

## 2022-12-18 NOTE — ED Provider Notes (Signed)
[facility]    CSN: [id number] Arrival date & time: 12/18/22  1448      History   Chief Complaint Chief Complaint  Patient presents with   Facial Swelling    X2 days Facial swelling, facial redness and facial pain    HPI Elizabeth Jennings is a 77 y.o. female presents for evaluation of facial redness and swelling.  Patient is accompanied by her son helps to translate as patient speaks [place].  Patient reports Sunday evening/Monday she developed some redness, warmth, swelling of her left nostril, left cheek that is now extended to her left upper lip.  She noticed some pustules with some drainage.  Denies any fevers or chills.  She does state when she swallows she feels like there might be something of the left side of her throat but denies difficulty swallowing or shortness of breath.  Denies tongue or throat swelling.  Denies tooth pain.  She is eating and drinking normally.  Denies history of MRSA or cellulitis in the past.  She has not used any OTC medications.  No other concerns at this time.  HPI  Past Medical History:  Diagnosis Date   Bell's palsy    Hemorrhoids    Hyperlipidemia    Hypertension    Osteoporosis    Prediabetes     Patient Active Problem List   Diagnosis Date Noted   Primary osteoarthritis of right knee 06/21/2022   Chronic pain of right knee 06/14/2022   Left-sided Bell's palsy 12/12/2020   Ptosis of left eyelid 12/12/2020   Left leg paresthesias 12/12/2020   Refusal of blood transfusions as patient is Jehovah's Witness 02/26/2017   Prediabetes 04/11/2016   Vitamin D deficiency 04/11/2016   Essential hypertension 04/10/2016   Arthritis 04/10/2016   Osteoporosis 04/10/2016   Obesity 04/10/2016   Hyperglycemia 04/10/2016   Environmental allergies 04/10/2016   Language barrier to communication 04/10/2016    Past Surgical History:  Procedure Laterality Date   EYE SURGERY  2017   cataracts both eyes    EYE SURGERY  2018   eye lid  surgery     OB History   No obstetric history on file.      Home Medications    Prior to Admission medications   Medication Sig Start Date End Date Taking? Authorizing Provider  acetaminophen (TYLENOL) 500 MG tablet Take 500 mg by mouth every 6 (six) hours as needed.   Yes [provider]  aspirin 81 MG tablet Take 81 mg by mouth daily.   Yes [provider]  azelastine (ASTELIN) 0.1 % nasal spray Place 2 sprays into both nostrils 2 (two) times daily. Use in each nostril as directed 04/19/22  Yes Roney Marion, MD  carboxymethylcellulose (REFRESH PLUS) 0.5 % SOLN 1 drop 3 (three) times daily as needed.   Yes [provider]  cetirizine (ZYRTEC ALLERGY) 10 MG tablet Take 1 tablet (10 mg total) by mouth daily. 02/28/22 02/28/23 Yes Fenton Foy, NP  fluticasone (FLONASE) 50 MCG/ACT nasal spray Place 2 sprays into both nostrils daily. 10/08/22  Yes Dorena Dew, FNP  losartan (COZAAR) 50 MG tablet TAKE 1 TABLET(50 MG) BY MOUTH DAILY 12/03/22  Yes Dorena Dew, FNP  meloxicam (MOBIC) 7.5 MG tablet Take 1 tablet (7.5 mg total) by mouth daily. 02/28/22  Yes Fenton Foy, NP  Multiple Vitamin (MULTIVITAMIN) capsule Take 1 capsule by mouth daily.   Yes [provider]  Multiple Vitamins-Minerals (ICAPS AREDS 2 PO) Take  by mouth.   Yes [provider]  traMADol (ULTRAM) 50 MG tablet Take 1 tablet (50 mg total) by mouth every 12 (twelve) hours as needed. 06/21/22  Yes Aundra Dubin, PA-C  Vitamin D, Ergocalciferol, (DRISDOL) 1.25 MG (50000 UNIT) CAPS capsule Take 1 capsule (50,000 Units total) by mouth every 7 (seven) days. 11/25/22  Yes Dorena Dew, FNP    Family History Family History  Problem Relation Age of Onset   Breast cancer Neg Hx     Social History Social History   Tobacco Use   Smoking status: Never    Passive exposure: Never   Smokeless tobacco: Never  Vaping Use   Vaping Use: Never used  Substance Use Topics    Alcohol use: No   Drug use: No     Allergies   Patient has no known allergies.   Review of Systems Review of Systems  Skin:        Left-sided facial swelling and redness     Physical Exam Triage Vital Signs ED Triage Vitals  Enc Vitals Group     BP 12/18/22 1511 139/82     Pulse Rate 12/18/22 1511 89     Resp 12/18/22 1511 16     Temp 12/18/22 1511 98.2 F (36.8 C)     Temp Source 12/18/22 1511 Oral     SpO2 12/18/22 1511 94 %     Weight 12/18/22 1509 163 lb (73.9 kg)     Height 12/18/22 1509 5' (1.524 m)     Head Circumference --      Peak Flow --      Pain Score 12/18/22 1509 9     Pain Loc --      Pain Edu? --      Excl. in [place]? --    No data found.  Updated Vital Signs BP 139/82 (BP Location: Left Arm)   Pulse 89   Temp 98.2 F (36.8 C) (Oral)   Resp 16   Ht 5' (1.524 m)   Wt 163 lb (73.9 kg)   SpO2 94%   BMI 31.83 kg/m   Visual Acuity Right Eye Distance:   Left Eye Distance:   Bilateral Distance:    Right Eye Near:   Left Eye Near:    Bilateral Near:     Physical Exam Vitals and nursing note reviewed.  Constitutional:      Appearance: Normal appearance.  HENT:     Head: Normocephalic and atraumatic.      Nose: No mucosal edema.     Mouth/Throat:     Lips: Pink.     Tongue: No lesions. Tongue does not deviate from midline.     Palate: No mass and lesions.     Pharynx: Oropharynx is clear. Uvula midline. No pharyngeal swelling, posterior oropharyngeal erythema or uvula swelling.  Eyes:     Pupils: Pupils are equal, round, and reactive to light.  Cardiovascular:     Rate and Rhythm: Normal rate.  Pulmonary:     Effort: Pulmonary effort is normal.  Skin:    General: Skin is warm and dry.  Neurological:     General: No focal deficit present.     Mental Status: She is alert and oriented to person, place, and time.  Psychiatric:        Mood and Affect: Mood normal.        Behavior: Behavior normal.      UC Treatments / Results   Labs (all  labs ordered are listed, but only abnormal results are displayed) Labs Reviewed - No data to display  EKG   Radiology No results found.  Procedures Procedures (including critical care time)  Medications Ordered in UC Medications  cefTRIAXone (ROCEPHIN) injection 1 g (has no administration in time range)  dexamethasone (DECADRON) injection 6 mg (has no administration in time range)    Initial Impression / Assessment and Plan / UC Course  I have reviewed the triage vital signs and the nursing notes.  Pertinent labs & imaging results that were available during my care of the patient were reviewed by me and considered in my medical decision making (see chart for details).     Reviewed exam and symptoms with patient and son.  Patient is afebrile in clinic with no airway compromise.  Discussed case with Dr. Lanny Cramp. Patient given 1 g ceftriaxone in clinic as well as 6 mg Decadron.  Patient monitored for 15 minutes after injection with no reaction noted and she tolerated well. Start doxycycline twice daily for 10 days Strict ER precautions reviewed with patient and son and they both verbalized understanding.  Discussed includes but is not limited to worsening facial swelling or redness, fevers/chills, throat or tongue swelling, breathing difficulty, or any other new concerns that arise Follow-up with PCP in 2 days for recheck Patient and son both verbalized understanding the above instructions Final Clinical Impressions(s) / UC Diagnoses   Final diagnoses:  Facial cellulitis   Discharge Instructions   None    ED Prescriptions   None    PDMP not reviewed this encounter.   Melynda Ripple, NP 12/18/22 [phone]

## 2022-12-18 NOTE — ED Triage Notes (Signed)
Son states that pt has some facial redness, facial swelling and facial pain. X2 days

## 2022-12-18 NOTE — Discharge Instructions (Addendum)
Start doxycycline twice daily for 10 days Please go to the emergency room ASAP if you develop any worsening symptoms.  This includes but is not limited to worsening facial swelling or redness, fevers or chills, throat or tongue swelling, breathing difficulty, or any new concerns that arise Please follow-up with your PCP in 2 days for recheck

## 2022-12-23 ENCOUNTER — Other Ambulatory Visit: Payer: Self-pay | Admitting: Family Medicine

## 2022-12-23 DIAGNOSIS — H353112 Nonexudative age-related macular degeneration, right eye, intermediate dry stage: Secondary | ICD-10-CM | POA: Diagnosis not present

## 2022-12-23 DIAGNOSIS — H04123 Dry eye syndrome of bilateral lacrimal glands: Secondary | ICD-10-CM | POA: Diagnosis not present

## 2022-12-23 DIAGNOSIS — Z1231 Encounter for screening mammogram for malignant neoplasm of breast: Secondary | ICD-10-CM

## 2022-12-23 DIAGNOSIS — G510 Bell's palsy: Secondary | ICD-10-CM | POA: Diagnosis not present

## 2022-12-23 DIAGNOSIS — H26493 Other secondary cataract, bilateral: Secondary | ICD-10-CM | POA: Diagnosis not present

## 2022-12-25 ENCOUNTER — Encounter: Payer: Self-pay | Admitting: Nurse Practitioner

## 2022-12-25 ENCOUNTER — Ambulatory Visit (INDEPENDENT_AMBULATORY_CARE_PROVIDER_SITE_OTHER): Payer: Medicare Other | Admitting: Nurse Practitioner

## 2022-12-25 VITALS — BP 144/72 | HR 74 | Temp 97.5°F | Ht 59.0 in | Wt 157.4 lb

## 2022-12-25 DIAGNOSIS — I10 Essential (primary) hypertension: Secondary | ICD-10-CM

## 2022-12-25 DIAGNOSIS — Z13220 Encounter for screening for lipoid disorders: Secondary | ICD-10-CM | POA: Diagnosis not present

## 2022-12-25 DIAGNOSIS — B029 Zoster without complications: Secondary | ICD-10-CM | POA: Diagnosis not present

## 2022-12-25 DIAGNOSIS — Z131 Encounter for screening for diabetes mellitus: Secondary | ICD-10-CM | POA: Diagnosis not present

## 2022-12-25 DIAGNOSIS — R739 Hyperglycemia, unspecified: Secondary | ICD-10-CM

## 2022-12-25 DIAGNOSIS — R220 Localized swelling, mass and lump, head: Secondary | ICD-10-CM | POA: Insufficient documentation

## 2022-12-25 MED ORDER — PREDNISONE 20 MG PO TABS: 20.0000 mg | ORAL_TABLET | Freq: Every day | ORAL | 0 refills | Status: AC

## 2022-12-25 NOTE — Assessment & Plan Note (Signed)
-   predniSONE (DELTASONE) 20 MG tablet; Take 1 tablet (20 mg total) by mouth daily with breakfast for 5 days.  Dispense: 5 tablet; Refill: 0  2. Herpes zoster without complication  -prednisone  3. Essential hypertension  - CBC - Comprehensive metabolic panel  4. Lipid screening  - Lipid Panel  5. Diabetes mellitus screening  - Hemoglobin A1c  6. Hyperglycemia  - Hemoglobin A1c   Follow up:  Follow up 3 months

## 2022-12-25 NOTE — Progress Notes (Signed)
@Patient  ID: Elizabeth Jennings, female    DOB: [birth year], 77 y.o.   MRN: [id number]  Chief Complaint  Patient presents with   Follow-up    Facial Swelling very painful head neck and ear.     Referring provider: Dorena Dew, FNP   HPI  Patient presents today for ED follow-up.  She was Jennings in the ED on 12/18/2022 and was prescribed doxycycline for facial cellulitis.  Patient's left side of her face has developed vesicular rash.  She is having pain to the left side of her face and eye.  We discussed that most likely this is shingles.  She did see her eye doctor on Monday.  We will order prednisone.  Patient is currently still finishing doxycycline Denies f/c/s, n/v/d, hemoptysis, PND, leg swelling Denies chest pain or edema      No Known Allergies  Immunization History  Administered Date(s) Administered   Influenza,inj,Quad PF,6+ Mos 12/13/2016, 12/11/2017, 12/03/2018, 12/14/2019   PFIZER(Purple Top)SARS-COV-2 Vaccination 01/21/2020, 02/15/2020, 01/18/2021   Pneumococcal Conjugate-13 04/10/2016, 12/11/2017   Pneumococcal Polysaccharide-23 06/05/2021   Tdap 04/10/2016    Past Medical History:  Diagnosis Date   Bell's palsy    Hemorrhoids    Hyperlipidemia    Hypertension    Osteoporosis    Prediabetes     Tobacco History: Social History   Tobacco Use  Smoking Status Never   Passive exposure: Never  Smokeless Tobacco Never   Counseling given: Not Answered   Outpatient Encounter Medications as of 12/25/2022  Medication Sig   acetaminophen (TYLENOL) 500 MG tablet Take 500 mg by mouth every 6 (six) hours as needed.   ascorbic acid (VITAMIN C) 500 MG tablet Take 500 mg by mouth daily.   aspirin 81 MG tablet Take 81 mg by mouth daily.   azelastine (ASTELIN) 0.1 % nasal spray Place 2 sprays into both nostrils 2 (two) times daily. Use in each nostril as directed   carboxymethylcellulose (REFRESH PLUS) 0.5 % SOLN 1 drop 3 (three) times daily as needed.   cetirizine  (ZYRTEC ALLERGY) 10 MG tablet Take 1 tablet (10 mg total) by mouth daily.   doxycycline (VIBRAMYCIN) 100 MG capsule Take 1 capsule (100 mg total) by mouth 2 (two) times daily for 10 days.   erythromycin ophthalmic ointment SMARTSIG:0.25 Sparingly In Eye(s) 3 Times Daily   fluticasone (FLONASE) 50 MCG/ACT nasal spray Place 2 sprays into both nostrils daily.   losartan (COZAAR) 50 MG tablet TAKE 1 TABLET(50 MG) BY MOUTH DAILY   meloxicam (MOBIC) 7.5 MG tablet Take 1 tablet (7.5 mg total) by mouth daily.   Multiple Vitamin (MULTIVITAMIN) capsule Take 1 capsule by mouth daily.   predniSONE (DELTASONE) 20 MG tablet Take 1 tablet (20 mg total) by mouth daily with breakfast for 5 days.   Vitamin D, Ergocalciferol, (DRISDOL) 1.25 MG (50000 UNIT) CAPS capsule Take 1 capsule (50,000 Units total) by mouth every 7 (seven) days.   Multiple Vitamins-Minerals (ICAPS AREDS 2 PO) Take by mouth. (Patient not taking: Reported on 12/25/2022)   traMADol (ULTRAM) 50 MG tablet Take 1 tablet (50 mg total) by mouth every 12 (twelve) hours as needed. (Patient not taking: Reported on 12/25/2022)   No facility-administered encounter medications on file as of 12/25/2022.     Review of Systems  Review of Systems  Constitutional: Negative.   HENT: Negative.    Cardiovascular: Negative.   Gastrointestinal: Negative.   Skin:  Positive for rash.  Allergic/Immunologic: Negative.   Neurological: Negative.  Psychiatric/Behavioral: Negative.         Physical Exam  BP (!) 144/72   Pulse 74   Temp (!) 97.5 F (36.4 C)   Ht 4\' 11"  (1.499 m)   Wt 157 lb 6.4 oz (71.4 kg)   SpO2 99%   BMI 31.79 kg/m   Wt Readings from Last 5 Encounters:  12/25/22 157 lb 6.4 oz (71.4 kg)  12/18/22 163 lb (73.9 kg)  06/04/22 163 lb 4 oz (74 kg)  04/19/22 166 lb (75.3 kg)  02/28/22 167 lb (75.8 kg)     Physical Exam Vitals and nursing note reviewed.  Constitutional:      General: She is not in acute distress.    Appearance: She  is well-developed.  Cardiovascular:     Rate and Rhythm: Normal rate and regular rhythm.  Pulmonary:     Effort: Pulmonary effort is normal.     Breath sounds: Normal breath sounds.  Skin:    Comments: Vesicular rash to left side of face consistent with shingles  Neurological:     Mental Status: She is alert and oriented to person, place, and time.      Lab Results:  CBC    Component Value Date/Time   WBC 7.2 02/28/2022 1504   WBC 6.9 11/26/2020 0021   RBC 4.69 02/28/2022 1504   RBC 4.53 11/26/2020 0021   HGB 14.9 02/28/2022 1504   HCT 44.7 02/28/2022 1504   PLT 205 02/28/2022 1504   MCV 95 02/28/2022 1504   MCH 31.8 02/28/2022 1504   MCH 31.6 11/26/2020 0021   MCHC 33.3 02/28/2022 1504   MCHC 32.4 11/26/2020 0021   RDW 13.4 02/28/2022 1504   LYMPHSABS 2.7 11/26/2020 0021   MONOABS 0.6 11/26/2020 0021   EOSABS 0.1 11/26/2020 0021   BASOSABS 0.0 11/26/2020 0021    BMET    Component Value Date/Time   NA 142 02/28/2022 1504   K 3.5 02/28/2022 1504   CL 102 02/28/2022 1504   CO2 22 02/28/2022 1504   GLUCOSE 78 02/28/2022 1504   GLUCOSE 111 (H) 11/26/2020 0047   BUN 17 02/28/2022 1504   CREATININE 0.61 02/28/2022 1504   CREATININE 0.62 12/13/2016 1005   CALCIUM 9.0 02/28/2022 1504   GFRNONAA >60 11/26/2020 0021   GFRNONAA >89 12/13/2016 1005   GFRAA 104 02/22/2020 1157   GFRAA >89 12/13/2016 1005      Assessment & Plan:   Facial swelling - predniSONE (DELTASONE) 20 MG tablet; Take 1 tablet (20 mg total) by mouth daily with breakfast for 5 days.  Dispense: 5 tablet; Refill: 0  2. Herpes zoster without complication  -prednisone  3. Essential hypertension  - CBC - Comprehensive metabolic panel  4. Lipid screening  - Lipid Panel  5. Diabetes mellitus screening  - Hemoglobin A1c  6. Hyperglycemia  - Hemoglobin A1c   Follow up:  Follow up 3 months     Fenton Foy, NP 12/25/2022

## 2022-12-25 NOTE — Patient Instructions (Addendum)
1. Facial swelling  - predniSONE (DELTASONE) 20 MG tablet; Take 1 tablet (20 mg total) by mouth daily with breakfast for 5 days.  Dispense: 5 tablet; Refill: 0  2. Herpes zoster without complication  -prednisone  3. Essential hypertension  - CBC - Comprehensive metabolic panel  4. Lipid screening  - Lipid Panel  5. Diabetes mellitus screening  - Hemoglobin A1c  6. Hyperglycemia  - Hemoglobin A1c   Follow up:  Follow up 3 months

## 2022-12-26 LAB — COMPREHENSIVE METABOLIC PANEL
ALT: 32 IU/L (ref 0–32)
AST: 39 IU/L (ref 0–40)
Albumin/Globulin Ratio: 1.4 (ref 1.2–2.2)
Albumin: 4.1 g/dL (ref 3.8–4.8)
Alkaline Phosphatase: 94 IU/L (ref 44–121)
BUN/Creatinine Ratio: 23 (ref 12–28)
BUN: 15 mg/dL (ref 8–27)
Bilirubin Total: 0.4 mg/dL (ref 0.0–1.2)
CO2: 23 mmol/L (ref 20–29)
Calcium: 9 mg/dL (ref 8.7–10.3)
Chloride: 102 mmol/L (ref 96–106)
Creatinine, Ser: 0.64 mg/dL (ref 0.57–1.00)
Globulin, Total: 3 g/dL (ref 1.5–4.5)
Glucose: 99 mg/dL (ref 70–99)
Potassium: 3.9 mmol/L (ref 3.5–5.2)
Sodium: 138 mmol/L (ref 134–144)
Total Protein: 7.1 g/dL (ref 6.0–8.5)
eGFR: 92 mL/min/{1.73_m2} (ref >59)

## 2022-12-26 LAB — LIPID PANEL
Chol/HDL Ratio: 2.7 ratio (ref 0.0–4.4)
Cholesterol, Total: 130 mg/dL (ref 100–199)
HDL: 48 mg/dL (ref >39)
LDL Chol Calc (NIH): 63 mg/dL (ref 0–99)
Triglycerides: 100 mg/dL (ref 0–149)
VLDL Cholesterol Cal: 19 mg/dL (ref 5–40)

## 2022-12-26 LAB — CBC
Hematocrit: 39 % (ref 34.0–46.6)
Hemoglobin: 12.9 g/dL (ref 11.1–15.9)
MCH: 31.6 pg (ref 26.6–33.0)
MCHC: 33.1 g/dL (ref 31.5–35.7)
MCV: 96 fL (ref 79–97)
Platelets: 229 10*3/uL (ref 150–450)
RBC: 4.08 x10E6/uL (ref 3.77–5.28)
RDW: 13.1 % (ref 11.7–15.4)
WBC: 7.1 10*3/uL (ref 3.4–10.8)

## 2022-12-26 LAB — HEMOGLOBIN A1C
Est. average glucose Bld gHb Est-mCnc: 123 mg/dL
Hgb A1c MFr Bld: 5.9 % — ABNORMAL HIGH (ref 4.8–5.6)

## 2023-01-06 ENCOUNTER — Ambulatory Visit
Admission: EM | Admit: 2023-01-06 | Discharge: 2023-01-06 | Disposition: A | Discharge status: Home / Self Care | Payer: Medicare Other | Attending: Nurse Practitioner | Admitting: Nurse Practitioner

## 2023-01-06 DIAGNOSIS — N760 Acute vaginitis: Secondary | ICD-10-CM | POA: Diagnosis not present

## 2023-01-06 DIAGNOSIS — N898 Other specified noninflammatory disorders of vagina: Secondary | ICD-10-CM | POA: Diagnosis not present

## 2023-01-06 LAB — POCT URINALYSIS DIP (MANUAL ENTRY)
Blood, UA: NEGATIVE
Glucose, UA: NEGATIVE mg/dL
Ketones, POC UA: NEGATIVE mg/dL
Leukocytes, UA: NEGATIVE
Nitrite, UA: NEGATIVE
Protein Ur, POC: 30 mg/dL — AB
Spec Grav, UA: >=1.03 — AB (ref 1.010–1.025)
Urobilinogen, UA: 1 E.U./dL
pH, UA: 6 (ref 5.0–8.0)

## 2023-01-06 MED ORDER — FLUCONAZOLE 150 MG PO TABS: 150.0000 mg | ORAL_TABLET | Freq: Every day | ORAL | 0 refills | Status: DC

## 2023-01-06 NOTE — ED Triage Notes (Signed)
Pt c/o vaginal itching x 3 days.

## 2023-01-06 NOTE — ED Provider Notes (Signed)
[facility]    CSN: [id number] Arrival date & time: 01/06/23  1626      History   Chief Complaint Chief Complaint  Patient presents with   Vaginal Itching    HPI Elizabeth Jennings is a 77 y.o. female presents for evaluation of vaginal itching.  Patient is accompanied by family helps to translate as she speaks [place].  Reports 3 days of vaginal itching with some discharge.  No dysuria, fevers, abdominal pain, nausea/vomiting, flank pain.  She was on antibiotics at the end of January for facial cellulitis.  She tried an over-the-counter cream with minimal improvement.  She has a history of yeast infections and states this feels similar.  No other concerns at this time.   Vaginal Itching    Past Medical History:  Diagnosis Date   Bell's palsy    Hemorrhoids    Hyperlipidemia    Hypertension    Osteoporosis    Prediabetes     Patient Active Problem List   Diagnosis Date Noted   Facial swelling 12/25/2022   Primary osteoarthritis of right knee 06/21/2022   Chronic pain of right knee 06/14/2022   Left-sided Bell's palsy 12/12/2020   Ptosis of left eyelid 12/12/2020   Left leg paresthesias 12/12/2020   Refusal of blood transfusions as patient is [employer]hov[employer]'s Witness 02/26/2017   Prediabetes 04/11/2016   Vitamin D deficiency 04/11/2016   Essential hypertension 04/10/2016   Arthritis 04/10/2016   Osteoporosis 04/10/2016   Obesity 04/10/2016   Hyperglycemia 04/10/2016   Environmental allergies 04/10/2016   Language barrier to communication 04/10/2016    Past Surgical History:  Procedure Laterality Date   EYE SURGERY  2017   cataracts both eyes    EYE SURGERY  2018   eye lid surgery     OB History   No obstetric history on file.      Home Medications    Prior to Admission medications   Medication Sig Start Date End Date Taking? Authorizing Provider  doxycycline (ADOXA) 100 MG tablet Take 100 mg by mouth 2 (two) times daily.   Yes [provider]  fluconazole (DIFLUCAN) 150 MG tablet Take 1 tablet (150 mg total) by mouth daily. 01/06/23  Yes Melynda Ripple, NP  acetaminophen (TYLENOL) 500 MG tablet Take 500 mg by mouth every 6 (six) hours as needed.    [provider]  ascorbic acid (VITAMIN C) 500 MG tablet Take 500 mg by mouth daily.    [provider]  aspirin 81 MG tablet Take 81 mg by mouth daily.    [provider]  azelastine (ASTELIN) 0.1 % nasal spray Place 2 sprays into both nostrils 2 (two) times daily. Use in each nostril as directed 04/19/22   Roney Marion, MD  carboxymethylcellulose (REFRESH PLUS) 0.5 % SOLN 1 drop 3 (three) times daily as needed.    [provider]  cetirizine (ZYRTEC ALLERGY) 10 MG tablet Take 1 tablet (10 mg total) by mouth daily. 02/28/22 02/28/23  Fenton Foy, NP  erythromycin ophthalmic ointment SMARTSIG:0.25 Sparingly In Eye(s) 3 Times Daily 12/23/22   [provider]  fluticasone (FLONASE) 50 MCG/ACT nasal spray Place 2 sprays into both nostrils daily. 10/08/22   Dorena Dew, FNP  losartan (COZAAR) 50 MG tablet TAKE 1 TABLET(50 MG) BY MOUTH DAILY 12/03/22   Dorena Dew, FNP  meloxicam (MOBIC) 7.5 MG tablet Take 1 tablet (7.5 mg total) by mouth daily. 02/28/22   Fenton Foy, NP  Multiple Vitamin (MULTIVITAMIN) capsule Take 1 capsule by mouth daily.    [provider]  Multiple Vitamins-Minerals (ICAPS AREDS 2 PO) Take by mouth. Patient not taking: Reported on 12/25/2022    [provider]  traMADol (ULTRAM) 50 MG tablet Take 1 tablet (50 mg total) by mouth every 12 (twelve) hours as needed. Patient not taking: Reported on 12/25/2022 06/21/22   Aundra Dubin, PA-C  Vitamin D, Ergocalciferol, (DRISDOL) 1.25 MG (50000 UNIT) CAPS capsule Take 1 capsule (50,000 Units total) by mouth every 7 (seven) days. 11/25/22   Dorena Dew, FNP    Family History Family History  Problem Relation Age of Onset   Breast cancer Neg Hx      Social History Social History   Tobacco Use   Smoking status: Never    Passive exposure: Never   Smokeless tobacco: Never  Vaping Use   Vaping Use: Never used  Substance Use Topics   Alcohol use: No   Drug use: No     Allergies   Patient has no known allergies.   Review of Systems Review of Systems  Genitourinary:        Vaginal itching     Physical Exam Triage Vital Signs ED Triage Vitals  Enc Vitals Group     BP 01/06/23 1633 135/81     Pulse Rate 01/06/23 1633 89     Resp 01/06/23 1633 18     Temp --      Temp Source 01/06/23 1633 Oral     SpO2 01/06/23 1633 96 %     Weight --      Height --      Head Circumference --      Peak Flow --      Pain Score 01/06/23 1634 0     Pain Loc --      Pain Edu? --      Excl. in [place]? --    No data found.  Updated Vital Signs BP 135/81 (BP Location: Left Arm)   Pulse 89   Resp 18   SpO2 96%   Visual Acuity Right Eye Distance:   Left Eye Distance:   Bilateral Distance:    Right Eye Near:   Left Eye Near:    Bilateral Near:     Physical Exam Vitals and nursing note reviewed.  Constitutional:      Appearance: Normal appearance.  HENT:     Head: Normocephalic and atraumatic.  Eyes:     Pupils: Pupils are equal, round, and reactive to light.  Cardiovascular:     Rate and Rhythm: Normal rate.  Pulmonary:     Effort: Pulmonary effort is normal.  Abdominal:     Tenderness: There is no right CVA tenderness or left CVA tenderness.  Skin:    General: Skin is warm and dry.  Neurological:     General: No focal deficit present.     Mental Status: She is alert and oriented to person, place, and time.  Psychiatric:        Mood and Affect: Mood normal.        Behavior: Behavior normal.      UC Treatments / Results  Labs (all labs ordered are listed, but only abnormal results are displayed) Labs Reviewed  POCT URINALYSIS DIP (MANUAL ENTRY)  CERVICOVAGINAL ANCILLARY ONLY    EKG   Radiology No  results found.  Procedures Procedures (including critical care time)  Medications Ordered in UC Medications - No data  to display  Initial Impression / Assessment and Plan / UC Course  I have reviewed the triage vital signs and the nursing notes.  Pertinent labs & imaging results that were available during my care of the patient were reviewed by me and considered in my medical decision making (see chart for details).     Vaginal swab and will contact for any positive results Start Diflucan x 1 UA negative for UTI Follow-up with PCP if symptoms do not improve ER precautions reviewed and patient verbalized understanding Final Clinical Impressions(s) / UC Diagnoses   Final diagnoses:  Vaginal itching  Acute vaginitis     Discharge Instructions      The clinic will contact you with results of the vaginal swab done today Diflucan x 1 Follow-up with your PCP if symptoms do not improve Please go to the ER for any worsening symptoms     ED Prescriptions     Medication Sig Dispense Auth. Provider   fluconazole (DIFLUCAN) 150 MG tablet Take 1 tablet (150 mg total) by mouth daily. 1 tablet Melynda Ripple, NP      PDMP not reviewed this encounter.   Melynda Ripple, NP 01/06/23 1712

## 2023-01-06 NOTE — Discharge Instructions (Signed)
The clinic will contact you with results of the vaginal swab done today Diflucan x 1 Follow-up with your PCP if symptoms do not improve Please go to the ER for any worsening symptoms

## 2023-01-07 LAB — CERVICOVAGINAL ANCILLARY ONLY
Bacterial Vaginitis (gardnerella): NEGATIVE
Candida Glabrata: NEGATIVE
Candida Vaginitis: NEGATIVE
Comment: NEGATIVE
Comment: NEGATIVE
Comment: NEGATIVE

## 2023-01-07 NOTE — Progress Notes (Signed)
Called pt and leave a message for return call.

## 2023-01-08 ENCOUNTER — Telehealth: Payer: Self-pay | Admitting: Family Medicine

## 2023-01-08 NOTE — Telephone Encounter (Signed)
Contacted Elizabeth Jennings to schedule their annual wellness visit. Appointment made for 01/16/2023.  Thank you,  [place] Direct dial  [phone]

## 2023-01-08 NOTE — Progress Notes (Signed)
Results mail it to pt home address.

## 2023-01-16 ENCOUNTER — Ambulatory Visit: Payer: Medicare Other

## 2023-01-16 VITALS — Ht 59.0 in | Wt 157.0 lb

## 2023-01-16 DIAGNOSIS — Z0000 Encounter for general adult medical examination without abnormal findings: Secondary | ICD-10-CM

## 2023-01-16 NOTE — Patient Instructions (Addendum)
Elizabeth Jennings , Thank you for taking time to come for your Medicare Wellness Visit. I appreciate your ongoing commitment to your health goals. Please review the following plan we discussed and let me know if I can assist you in the future.   These are the goals we discussed:  Goals       Increase physical activity (pt-stated)      Lose weight and keep it down.        This is a list of the screening recommended for you and due dates:  Health Maintenance  Topic Date Due   COVID-19 Vaccine (4 - 2023-24 season) 02/01/2023*   Flu Shot  02/16/2023*   Zoster (Shingles) Vaccine (1 of 2) 04/16/2023*   Medicare Annual Wellness Visit  01/16/2024   DTaP/Tdap/Td vaccine (2 - Td or Tdap) 04/10/2026   Pneumonia Vaccine  Completed   DEXA scan (bone density measurement)  Completed   Hepatitis C Screening: USPSTF Recommendation to screen - Ages 41-79 yo.  Completed   HPV Vaccine  Aged Out   Cologuard (Stool DNA test)  Discontinued  *Topic was postponed. The date shown is not the original due date.   Opioid Pain Medicine Management Opioids are powerful medicines that are used to treat moderate to severe pain. When used for short periods of time, they can help you to: Sleep better. Do better in physical or occupational therapy. Feel better in the first few days after an injury. Recover from surgery. Opioids should be taken with the supervision of a trained health care provider. They should be taken for the shortest period of time possible. This is because opioids can be addictive, and the longer you take opioids, the greater your risk of addiction. This addiction can also be called opioid use disorder. What are the risks? Using opioid pain medicines for longer than 3 days increases your risk of side effects. Side effects include: Constipation. Nausea and vomiting. Breathing difficulties (respiratory depression). Drowsiness. Confusion. Opioid use disorder. Itching. Taking opioid pain medicine  for a long period of time can affect your ability to do daily tasks. It also puts you at risk for: Motor vehicle crashes. Depression. Suicide. Heart attack. Overdose, which can be life-threatening. What is a pain treatment plan? A pain treatment plan is an agreement between you and your health care provider. Pain is unique to each person, and treatments vary depending on your condition. To manage your pain, you and your health care provider need to work together. To help you do this: Discuss the goals of your treatment, including how much pain you might expect to have and how you will manage the pain. Review the risks and benefits of taking opioid medicines. Remember that a good treatment plan uses more than one approach and minimizes the chance of side effects. Be honest about the amount of medicines you take and about any drug or alcohol use. Get pain medicine prescriptions from only one health care provider. Pain can be managed with many types of alternative treatments. Ask your health care provider to refer you to one or more specialists who can help you manage pain through: Physical or occupational therapy. Counseling (cognitive behavioral therapy). Good nutrition. Biofeedback. Massage. Meditation. Non-opioid medicine. Following a gentle exercise program. How to use opioid pain medicine Taking medicine Take your pain medicine exactly as told by your health care provider. Take it only when you need it. If your pain gets less severe, you may take less than your prescribed dose if your  health care provider approves. If you are not having pain, do nottake pain medicine unless your health care provider tells you to take it. If your pain is severe, do nottry to treat it yourself by taking more pills than instructed on your prescription. Contact your health care provider for help. Write down the times when you take your pain medicine. It is easy to become confused while on pain medicine.  Writing the time can help you avoid overdose. Take other over-the-counter or prescription medicines only as told by your health care provider. Keeping yourself and others safe  While you are taking opioid pain medicine: Do not drive, use machinery, or power tools. Do not sign legal documents. Do not drink alcohol. Do not take sleeping pills. Do not supervise children by yourself. Do not do activities that require climbing or being in high places. Do not go to a lake, river, ocean, spa, or swimming pool. Do not share your pain medicine with anyone. Keep pain medicine in a locked cabinet or in a secure area where pets and children cannot reach it. Stopping your use of opioids If you have been taking opioid medicine for more than a few weeks, you may need to slowly decrease (taper) how much you take until you stop completely. Tapering your use of opioids can decrease your risk of symptoms of withdrawal, such as: Pain and cramping in the abdomen. Nausea. Sweating. Sleepiness. Restlessness. Uncontrollable shaking (tremors). Cravings for the medicine. Do not attempt to taper your use of opioids on your own. Talk with your health care provider about how to do this. Your health care provider may prescribe a step-down schedule based on how much medicine you are taking and how long you have been taking it. Getting rid of leftover pills Do not save any leftover pills. Get rid of leftover pills safely by: Taking the medicine to a prescription take-back program. This is usually offered by the county or law enforcement. Bringing them to a pharmacy that has a drug disposal container. Flushing them down the toilet. Check the label or package insert of your medicine to see whether this is safe to do. Throwing them out in the trash. Check the label or package insert of your medicine to see whether this is safe to do. If it is safe to throw it out, remove the medicine from the original container, put it  into a sealable bag or container, and mix it with used coffee grounds, food scraps, dirt, or cat litter before putting it in the trash. Follow these instructions at home: Activity Do exercises as told by your health care provider. Avoid activities that make your pain worse. Return to your normal activities as told by your health care provider. Ask your health care provider what activities are safe for you. General instructions You may need to take these actions to prevent or treat constipation: Drink enough fluid to keep your urine pale yellow. Take over-the-counter or prescription medicines. Eat foods that are high in fiber, such as beans, whole grains, and fresh fruits and vegetables. Limit foods that are high in fat and processed sugars, such as fried or sweet foods. Keep all follow-up visits. This is important. Where to find support If you have been taking opioids for a long time, you may benefit from receiving support for quitting from a local support group or counselor. Ask your health care provider for a referral to these resources in your area. Where to find more information Centers for Disease Control and  Prevention (CDC): http://www.wolf.info/ [place] Food and Drug Administration (FDA): [place] Get help right away if: You may have taken too much of an opioid (overdosed). Common symptoms of an overdose: Your breathing is slower or more shallow than normal. You have a very slow heartbeat (pulse). You have slurred speech. You have nausea and vomiting. Your pupils become very small. You have other potential symptoms: You are very confused. You faint or feel like you will faint. You have cold, clammy skin. You have blue lips or fingernails. You have thoughts of harming yourself or harming others. These symptoms may represent a serious problem that is an emergency. Do not wait to see if the symptoms will go away. Get medical help right away. Call your local emergency services (911 in the  [place] Do not drive yourself to the hospital.  If you ever feel like you may hurt yourself or others, or have thoughts about taking your own life, get help right away. Go to your nearest emergency department or: Call your local emergency services (911 in the [place] Call the [facility] (512) 833-8895 in the [place] Call a suicide crisis helpline, such as the [place] at (323)610-0328 or 988 in the [place]. This is open 24 hours a day in the [place] Text the Crisis Text Line at 579 531 1965 (in the [place].). Summary Opioid medicines can help you manage moderate to severe pain for a short period of time. A pain treatment plan is an agreement between you and your health care provider. Discuss the goals of your treatment, including how much pain you might expect to have and how you will manage the pain. If you think that you or someone else may have taken too much of an opioid, get medical help right away. This information is not intended to replace advice given to you by your health care provider. Make sure you discuss any questions you have with your health care provider. Document Revised: 05/30/2021 Document Reviewed: 02/14/2021 Elsevier Patient Education  [place] directives: Advance directive discussed with you today. Even though you declined this today, please call our office should you change your mind, and we can give you the proper paperwork for you to fill out.   Conditions/risks identified: None  Next appointment: Follow up in one year for your annual wellness visit     Preventive Care 65 Years and Older, Female Preventive care refers to lifestyle choices and visits with your health care provider that can promote health and wellness. What does preventive care include? A yearly physical exam. This is also called an annual well check. Dental exams once or twice a year. Routine eye exams. Ask your health care provider how often you  should have your eyes checked. Personal lifestyle choices, including: Daily care of your teeth and gums. Regular physical activity. Eating a healthy diet. Avoiding tobacco and drug use. Limiting alcohol use. Practicing safe sex. Taking low-dose aspirin every day. Taking vitamin and mineral supplements as recommended by your health care provider. What happens during an annual well check? The services and screenings done by your health care provider during your annual well check will depend on your age, overall health, lifestyle risk factors, and family history of disease. Counseling  Your health care provider may ask you questions about your: Alcohol use. Tobacco use. Drug use. Emotional well-being. Home and relationship well-being. Sexual activity. Eating habits. History of falls. Memory and ability to understand (cognition). Work and work Statistician. Reproductive health. Screening  You may have the following tests or measurements: Height, weight, and BMI. Blood pressure. Lipid and cholesterol levels. These may be checked every 5 years, or more frequently if you are over 11 years old. Skin check. Lung cancer screening. You may have this screening every year starting at age 58 if you have a 30-pack-year history of smoking and currently smoke or have quit within the past 15 years. Fecal occult blood test (FOBT) of the stool. You may have this test every year starting at age 55. Flexible sigmoidoscopy or colonoscopy. You may have a sigmoidoscopy every 5 years or a colonoscopy every 10 years starting at age 40. Hepatitis C blood test. Hepatitis B blood test. Sexually transmitted disease (STD) testing. Diabetes screening. This is done by checking your blood sugar (glucose) after you have not eaten for a while (fasting). You may have this done every 1-3 years. Bone density scan. This is done to screen for osteoporosis. You may have this done starting at age 53. Mammogram. This may  be done every 1-2 years. Talk to your health care provider about how often you should have regular mammograms. Talk with your health care provider about your test results, treatment options, and if necessary, the need for more tests. Vaccines  Your health care provider may recommend certain vaccines, such as: Influenza vaccine. This is recommended every year. Tetanus, diphtheria, and acellular pertussis (Tdap, Td) vaccine. You may need a Td booster every 10 years. Zoster vaccine. You may need this after age 31. Pneumococcal 13-valent conjugate (PCV13) vaccine. One dose is recommended after age 65. Pneumococcal polysaccharide (PPSV23) vaccine. One dose is recommended after age 11. Talk to your health care provider about which screenings and vaccines you need and how often you need them. This information is not intended to replace advice given to you by your health care provider. Make sure you discuss any questions you have with your health care provider. Document Released: 12/01/2015 Document Revised: 07/24/2016 Document Reviewed: 09/05/2015 Elsevier Interactive Patient Education  2017 [place] Prevention in the Home Falls can cause injuries. They can happen to people of all ages. There are many things you can do to make your home safe and to help prevent falls. What can I do on the outside of my home? Regularly fix the edges of walkways and driveways and fix any cracks. Remove anything that might make you trip as you walk through a door, such as a raised step or threshold. Trim any bushes or trees on the path to your home. Use bright outdoor lighting. Clear any walking paths of anything that might make someone trip, such as rocks or tools. Regularly check to see if handrails are loose or broken. Make sure that both sides of any steps have handrails. Any raised decks and porches should have guardrails on the edges. Have any leaves, snow, or ice cleared regularly. Use sand or salt  on walking paths during winter. Clean up any spills in your garage right away. This includes oil or grease spills. What can I do in the bathroom? Use night lights. Install grab bars by the toilet and in the tub and shower. Do not use towel bars as grab bars. Use non-skid mats or decals in the tub or shower. If you need to sit down in the shower, use a plastic, non-slip stool. Keep the floor dry. Clean up any water that spills on the floor as soon as it happens. Remove soap buildup in the tub or shower regularly.  Attach bath mats securely with double-sided non-slip rug tape. Do not have throw rugs and other things on the floor that can make you trip. What can I do in the bedroom? Use night lights. Make sure that you have a light by your bed that is easy to reach. Do not use any sheets or blankets that are too big for your bed. They should not hang down onto the floor. Have a firm chair that has side arms. You can use this for support while you get dressed. Do not have throw rugs and other things on the floor that can make you trip. What can I do in the kitchen? Clean up any spills right away. Avoid walking on wet floors. Keep items that you use a lot in easy-to-reach places. If you need to reach something above you, use a strong step stool that has a grab bar. Keep electrical cords out of the way. Do not use floor polish or wax that makes floors slippery. If you must use wax, use non-skid floor wax. Do not have throw rugs and other things on the floor that can make you trip. What can I do with my stairs? Do not leave any items on the stairs. Make sure that there are handrails on both sides of the stairs and use them. Fix handrails that are broken or loose. Make sure that handrails are as long as the stairways. Check any carpeting to make sure that it is firmly attached to the stairs. Fix any carpet that is loose or worn. Avoid having throw rugs at the top or bottom of the stairs. If you  do have throw rugs, attach them to the floor with carpet tape. Make sure that you have a light switch at the top of the stairs and the bottom of the stairs. If you do not have them, ask someone to add them for you. What else can I do to help prevent falls? Wear shoes that: Do not have high heels. Have rubber bottoms. Are comfortable and fit you well. Are closed at the toe. Do not wear sandals. If you use a stepladder: Make sure that it is fully opened. Do not climb a closed stepladder. Make sure that both sides of the stepladder are locked into place. Ask someone to hold it for you, if possible. Clearly mark and make sure that you can see: Any grab bars or handrails. First and last steps. Where the edge of each step is. Use tools that help you move around (mobility aids) if they are needed. These include: Canes. Walkers. Scooters. Crutches. Turn on the lights when you go into a dark area. Replace any light bulbs as soon as they burn out. Set up your furniture so you have a clear path. Avoid moving your furniture around. If any of your floors are uneven, fix them. If there are any pets around you, be aware of where they are. Review your medicines with your doctor. Some medicines can make you feel dizzy. This can increase your chance of falling. Ask your doctor what other things that you can do to help prevent falls. This information is not intended to replace advice given to you by your health care provider. Make sure you discuss any questions you have with your health care provider. Document Released: 08/31/2009 Document Revised: 04/11/2016 Document Reviewed: 12/09/2014 Elsevier Interactive Patient Education  2017 [employer].

## 2023-01-16 NOTE — Progress Notes (Signed)
Subjective:   Elizabeth Jennings is a 77 y.o. female who presents for Medicare Annual (Subsequent) preventive examination.  Review of Systems    Virtual Visit via Telephone Note  I connected with  Elizabeth Jennings on 01/16/23 at  3:00 PM EST by telephone and verified that I am speaking with the correct person using two identifiers.  Location: Patient: Home Provider: Office Persons participating in the virtual visit: patient/Nurse Health Advisor   I discussed the limitations, risks, security and privacy concerns of performing an evaluation and management service by telephone and the availability of in person appointments. The patient expressed understanding and agreed to proceed.  Interactive audio and video telecommunications were attempted between this nurse and patient, however failed, due to patient having technical difficulties OR patient did not have access to video capability.  We continued and completed visit with audio only.  Some vital signs may be absent or patient reported.   Elizabeth Peaches, LPN  Cardiac Risk Factors include: advanced age (>67mn, >>79women);hypertension     Objective:    Today's Vitals   01/16/23 1515  Weight: 157 lb (71.2 kg)  Height: '4\' 11"'$  (1.499 m)  PainSc: 0-No pain   Body mass index is 31.71 kg/m.     01/16/2023    3:30 PM 05/17/2016   10:18 AM 04/10/2016   11:17 AM  Advanced Directives  Does Patient Have a Medical Advance Directive? No No No  Would patient like information on creating a medical advance directive? No - Patient declined No - patient declined information No - patient declined information    Current Medications (verified) Outpatient Encounter Medications as of 01/16/2023  Medication Sig   acetaminophen (TYLENOL) 500 MG tablet Take 500 mg by mouth every 6 (six) hours as needed.   ascorbic acid (VITAMIN C) 500 MG tablet Take 500 mg by mouth daily.   aspirin 81 MG tablet Take 81 mg by mouth daily.   azelastine (ASTELIN) 0.1 %  nasal spray Place 2 sprays into both nostrils 2 (two) times daily. Use in each nostril as directed   carboxymethylcellulose (REFRESH PLUS) 0.5 % SOLN 1 drop 3 (three) times daily as needed.   cetirizine (ZYRTEC ALLERGY) 10 MG tablet Take 1 tablet (10 mg total) by mouth daily.   doxycycline (ADOXA) 100 MG tablet Take 100 mg by mouth 2 (two) times daily.   erythromycin ophthalmic ointment SMARTSIG:0.25 Sparingly In Eye(s) 3 Times Daily   fluconazole (DIFLUCAN) 150 MG tablet Take 1 tablet (150 mg total) by mouth daily.   fluticasone (FLONASE) 50 MCG/ACT nasal spray Place 2 sprays into both nostrils daily.   losartan (COZAAR) 50 MG tablet TAKE 1 TABLET(50 MG) BY MOUTH DAILY   meloxicam (MOBIC) 7.5 MG tablet Take 1 tablet (7.5 mg total) by mouth daily.   Multiple Vitamin (MULTIVITAMIN) capsule Take 1 capsule by mouth daily.   Multiple Vitamins-Minerals (ICAPS AREDS 2 PO) Take by mouth. (Patient not taking: Reported on 12/25/2022)   traMADol (ULTRAM) 50 MG tablet Take 1 tablet (50 mg total) by mouth every 12 (twelve) hours as needed. (Patient not taking: Reported on 12/25/2022)   Vitamin D, Ergocalciferol, (DRISDOL) 1.25 MG (50000 UNIT) CAPS capsule Take 1 capsule (50,000 Units total) by mouth every 7 (seven) days.   No facility-administered encounter medications on file as of 01/16/2023.    Allergies (verified) Patient has no known allergies.   History: Past Medical History:  Diagnosis Date   Bell's palsy    Hemorrhoids  Hyperlipidemia    Hypertension    Osteoporosis    Prediabetes    Past Surgical History:  Procedure Laterality Date   EYE SURGERY  2017   cataracts both eyes    EYE SURGERY  2018   eye lid surgery    Family History  Problem Relation Age of Onset   Breast cancer Neg Hx    Social History   Socioeconomic History   Marital status: Divorced    Spouse name: Not on file   Number of children: Not on file   Years of education: Not on file   Highest education level: Not  on file  Occupational History   Not on file  Tobacco Use   Smoking status: Never    Passive exposure: Never   Smokeless tobacco: Never  Vaping Use   Vaping Use: Never used  Substance and Sexual Activity   Alcohol use: No   Drug use: No   Sexual activity: Not Currently    Birth control/protection: None  Other Topics Concern   Not on file  Social History Narrative   Lives with son   Right Handed   Drinks no caffeien   Social Determinants of Health   Financial Resource Strain: Low Risk  (01/16/2023)   Overall Financial Resource Strain (CARDIA)    Difficulty of Paying Living Expenses: Not hard at all  Food Insecurity: No Food Insecurity (01/16/2023)   Hunger Vital Sign    Worried About Running Out of Food in the Last Year: Never true    Ran Out of Food in the Last Year: Never true  Transportation Needs: No Transportation Needs (01/16/2023)   PRAPARE - Hydrologist (Medical): No    Lack of Transportation (Non-Medical): No  Physical Activity: Insufficiently Active (01/16/2023)   Exercise Vital Sign    Days of Exercise per Week: 3 days    Minutes of Exercise per Session: 20 min  Stress: No Stress Concern Present (01/16/2023)   Summers    Feeling of Stress : Not at all  Social Connections: Moderately Integrated (01/16/2023)   Social Connection and Isolation Panel [NHANES]    Frequency of Communication with Friends and Family: More than three times a week    Frequency of Social Gatherings with Friends and Family: More than three times a week    Attends Religious Services: More than 4 times per year    Active Member of [employer] or Organizations: Yes    Attends Music therapist: More than 4 times per year    Marital Status: Divorced    Tobacco Counseling Counseling given: Not Answered   Clinical Intake:  Pre-visit preparation completed: No  Pain : No/denies pain Pain  Score: 0-No pain     BMI - recorded: 31.71 Nutritional Risks: None Diabetes: No  How often do you need to have someone help you when you read instructions, pamphlets, or other written materials from your doctor or pharmacy?: 4 - Often (Friend assist)  Diabetic?  No  Interpreter Needed?: Yes Interpreter Agency: [facility] Interpreter Name: [name] ID: [id number] Patient Declined Interpreter : No Patient signed Power waiver: No  Information entered by :: Rolene Arbour LPN   Activities of Daily Living    01/16/2023    3:26 PM  In your present state of health, do you have any difficulty performing the following activities:  Hearing? 0  Vision? 0  Difficulty concentrating or making  decisions? 0  Walking or climbing stairs? 0  Dressing or bathing? 0  Doing errands, shopping? 0  Preparing Food and eating ? N  Using the Toilet? N  In the past six months, have you accidently leaked urine? N  Do you have problems with loss of bowel control? N  Managing your Medications? N  Managing your Finances? N  Housekeeping or managing your Housekeeping? N    Patient Care Team: Dorena Dew, FNP as PCP - General (Family Medicine)  Indicate any recent Medical Services you may have received from other than [facility] providers in the past year (date may be approximate).     Assessment:   This is a routine wellness examination for Elizabeth Jennings.  Hearing/Vision screen Hearing Screening - Comments:: Denies hearing difficulties   Vision Screening - Comments:: Wears rx glasses - up to date with routine eye exams with  Dr Herbert Deaner  Dietary issues and exercise activities discussed: Exercise limited by: orthopedic condition(s)   Goals Addressed               This Visit's Progress     Increase physical activity (pt-stated)        Lose weight and keep it down.       Depression Screen    01/16/2023    3:22 PM 12/25/2022    1:56 PM 06/04/2022   11:55 AM 02/28/2022    1:59 PM  04/24/2021   11:55 AM 04/24/2021   11:45 AM 02/22/2020   11:32 AM  PHQ 2/9 Scores  PHQ - 2 Score 0 0 0 0 2 0 0  PHQ- 9 Score   0  4      Fall Risk    01/16/2023    3:27 PM 06/04/2022   11:49 AM 02/28/2022    1:59 PM 06/05/2021   10:58 AM 04/24/2021   12:12 PM  [place] in the past year? 0 0 0 0 0  Number falls in past yr: 0 0 0 0 0  Injury with Fall? 0 0 0 0 1  Risk for fall due to : No Fall Risks No Fall Risks No Fall Risks    Follow up Falls prevention discussed Falls evaluation completed Falls evaluation completed      FALL RISK PREVENTION PERTAINING TO THE HOME:  Any stairs in or around the home? No  If so, are there any without handrails? No  Home free of loose throw rugs in walkways, pet beds, electrical cords, etc? Yes  Adequate lighting in your home to reduce risk of falls? Yes   ASSISTIVE DEVICES UTILIZED TO PREVENT FALLS:  Life alert? No  Use of a cane, walker or w/c? Yes  Grab bars in the bathroom? Yes  Shower chair or bench in shower? No  Elevated toilet seat or a handicapped toilet? No   TIMED UP AND GO:  Was the test performed? No . Audio Visit  Cognitive Function:        01/16/2023    3:30 PM  6CIT Screen  What Year? 0 points  What month? 0 points  What time? 0 points  Count back from 20 2 points  Months in reverse 4 points  Repeat phrase 10 points  Total Score 16 points    Immunizations Immunization History  Administered Date(s) Administered   Influenza,inj,Quad PF,6+ Mos 12/13/2016, 12/11/2017, 12/03/2018, 12/14/2019   PFIZER(Purple Top)SARS-COV-2 Vaccination 01/21/2020, 02/15/2020, 01/18/2021   Pneumococcal Conjugate-13 04/10/2016, 12/11/2017   Pneumococcal Polysaccharide-23 06/05/2021  Tdap 04/10/2016    TDAP status: Up to date    Pneumococcal vaccine status: Up to date  Covid-19 vaccine status: Completed vaccines  Qualifies for Shingles Vaccine? Yes   Zostavax completed No   Shingrix Completed?: No.    Education has been  provided regarding the importance of this vaccine. Patient has been advised to call insurance company to determine out of pocket expense if they have not yet received this vaccine. Advised may also receive vaccine at local pharmacy or Health Dept. Verbalized acceptance and understanding.  Screening Tests Health Maintenance  Topic Date Due   COVID-19 Vaccine (4 - 2023-24 season) 02/01/2023 (Originally 07/19/2022)   INFLUENZA VACCINE  02/16/2023 (Originally 06/18/2022)   Zoster Vaccines- Shingrix (1 of 2) 04/16/2023 (Originally 09/25/1965)   Medicare Annual Wellness (AWV)  01/16/2024   DTaP/Tdap/Td (2 - Td or Tdap) 04/10/2026   Pneumonia Vaccine 27+ Years old  Completed   DEXA SCAN  Completed   Hepatitis C Screening  Completed   HPV VACCINES  Aged Out   Fecal DNA (Cologuard)  Discontinued    Health Maintenance  There are no preventive care reminders to display for this patient.   Colorectal cancer screening: No longer required.   Mammogram status: No longer required due to Age.  Bone Density status: Completed 11/03/15. Results reflect: Bone density results: OSTEOPOROSIS. Repeat every   years.  Lung Cancer Screening: (Low Dose CT Chest recommended if Age 61-80 years, 30 pack-year currently smoking OR have quit w/in 15years.) does not qualify.     Additional Screening:  Hepatitis C Screening: does qualify; Completed 06/05/21  Vision Screening: Recommended annual ophthalmology exams for early detection of glaucoma and other disorders of the eye. Is the patient up to date with their annual eye exam?  Yes  Who is the provider or what is the name of the office in which the patient attends annual eye exams? Dr Herbert Deaner If pt is not established with a provider, would they like to be referred to a provider to establish care? No .   Dental Screening: Recommended annual dental exams for proper oral hygiene  Community Resource Referral / Chronic Care Management:  CRR required this visit?  No    CCM required this visit?  No      Plan:     I have personally reviewed and noted the following in the patient's chart:   Medical and social history Use of alcohol, tobacco or illicit drugs  Current medications and supplements including opioid prescriptions. Patient is currently taking opioid prescriptions. Information provided to patient regarding non-opioid alternatives. Patient advised to discuss non-opioid treatment plan with their provider. Functional ability and status Nutritional status Physical activity Advanced directives List of other physicians Hospitalizations, surgeries, and ER visits in previous 12 months Vitals Screenings to include cognitive, depression, and falls Referrals and appointments  In addition, I have reviewed and discussed with patient certain preventive protocols, quality metrics, and best practice recommendations. A written personalized care plan for preventive services as well as general preventive health recommendations were provided to patient.     Elizabeth Peaches, LPN   [id number]   Nurse Notes: None

## 2023-01-18 ENCOUNTER — Emergency Department (HOSPITAL_BASED_OUTPATIENT_CLINIC_OR_DEPARTMENT_OTHER): Payer: Medicare Other | Admitting: Radiology

## 2023-01-18 ENCOUNTER — Other Ambulatory Visit: Payer: Self-pay

## 2023-01-18 ENCOUNTER — Emergency Department (HOSPITAL_BASED_OUTPATIENT_CLINIC_OR_DEPARTMENT_OTHER): Payer: Medicare Other

## 2023-01-18 ENCOUNTER — Encounter (HOSPITAL_BASED_OUTPATIENT_CLINIC_OR_DEPARTMENT_OTHER): Payer: Self-pay | Admitting: Emergency Medicine

## 2023-01-18 ENCOUNTER — Emergency Department (HOSPITAL_BASED_OUTPATIENT_CLINIC_OR_DEPARTMENT_OTHER)
Admission: EM | Admit: 2023-01-18 | Discharge: 2023-01-19 | Disposition: A | Discharge status: Home / Self Care | Payer: Medicare Other | Attending: Emergency Medicine | Admitting: Emergency Medicine

## 2023-01-18 DIAGNOSIS — Z79899 Other long term (current) drug therapy: Secondary | ICD-10-CM | POA: Diagnosis not present

## 2023-01-18 DIAGNOSIS — M1711 Unilateral primary osteoarthritis, right knee: Secondary | ICD-10-CM | POA: Insufficient documentation

## 2023-01-18 DIAGNOSIS — M1990 Unspecified osteoarthritis, unspecified site: Secondary | ICD-10-CM

## 2023-01-18 DIAGNOSIS — M545 Low back pain, unspecified: Secondary | ICD-10-CM | POA: Diagnosis not present

## 2023-01-18 DIAGNOSIS — S32020A Wedge compression fracture of second lumbar vertebra, initial encounter for closed fracture: Secondary | ICD-10-CM | POA: Diagnosis not present

## 2023-01-18 DIAGNOSIS — I10 Essential (primary) hypertension: Secondary | ICD-10-CM | POA: Insufficient documentation

## 2023-01-18 DIAGNOSIS — X58XXXA Exposure to other specified factors, initial encounter: Secondary | ICD-10-CM | POA: Diagnosis not present

## 2023-01-18 DIAGNOSIS — Z7982 Long term (current) use of aspirin: Secondary | ICD-10-CM | POA: Insufficient documentation

## 2023-01-18 MED ORDER — TRAMADOL HCL 50 MG PO TABS
50.0000 mg | ORAL_TABLET | Freq: Once | ORAL | Status: AC
  Administered 2023-01-18: 50 mg via ORAL
  Filled 2023-01-18: qty 1

## 2023-01-18 NOTE — ED Provider Notes (Signed)
[facility] Provider Note: Georgena Spurling, MD, FACEP  CSN: [id number] MRN: [id number] ARRIVAL: 01/18/23 at 2232 ROOM: [place]  Back Pain   HISTORY OF PRESENT ILLNESS  01/18/23 10:57 PM Elizabeth Jennings is a 77 y.o. female with 2 days of midline lower back pain.  She denies any trauma or other inciting event the pain radiates around to her groins bilaterally.  It is worse with movement of her lower back.  She denies any new numbness or weakness.  Pain is moderate.  She is also having pain in her right knee but this is a more chronic issue and she has been told she has arthritis in her right knee.   Past Medical History:  Diagnosis Date   Bell's palsy    Hemorrhoids    Hyperlipidemia    Hypertension    Osteoporosis    Prediabetes     Past Surgical History:  Procedure Laterality Date   EYE SURGERY  2017   cataracts both eyes    EYE SURGERY  2018   eye lid surgery     Family History  Problem Relation Age of Onset   Breast cancer Neg Hx     Social History   Tobacco Use   Smoking status: Never    Passive exposure: Never   Smokeless tobacco: Never  Vaping Use   Vaping Use: Never used  Substance Use Topics   Alcohol use: No   Drug use: No    Prior to Admission medications   Medication Sig Start Date End Date Taking? Authorizing Provider  acetaminophen (TYLENOL) 500 MG tablet Take 500 mg by mouth every 6 (six) hours as needed.    [provider]  ascorbic acid (VITAMIN C) 500 MG tablet Take 500 mg by mouth daily.    [provider]  aspirin 81 MG tablet Take 81 mg by mouth daily.    [provider]  azelastine (ASTELIN) 0.1 % nasal spray Place 2 sprays into both nostrils 2 (two) times daily. Use in each nostril as directed 04/19/22   Roney Marion, MD  carboxymethylcellulose (REFRESH PLUS) 0.5 % SOLN 1 drop 3 (three) times daily as needed.    [provider]  cetirizine (ZYRTEC ALLERGY) 10 MG tablet  Take 1 tablet (10 mg total) by mouth daily. 02/28/22 02/28/23  Fenton Foy, NP  fluticasone (FLONASE) 50 MCG/ACT nasal spray Place 2 sprays into both nostrils daily. 10/08/22   Dorena Dew, FNP  losartan (COZAAR) 50 MG tablet TAKE 1 TABLET(50 MG) BY MOUTH DAILY 12/03/22   Dorena Dew, FNP  meloxicam (MOBIC) 7.5 MG tablet Take 1 tablet daily for back pain. 01/19/23   Isreal Moline, MD  Multiple Vitamin (MULTIVITAMIN) capsule Take 1 capsule by mouth daily.    [provider]  traMADol (ULTRAM) 50 MG tablet Take 1 tablet (50 mg total) by mouth every 6 (six) hours as needed for severe pain or moderate pain. 01/19/23   Teshara Moree, MD  Vitamin D, Ergocalciferol, (DRISDOL) 1.25 MG (50000 UNIT) CAPS capsule Take 1 capsule (50,000 Units total) by mouth every 7 (seven) days. 11/25/22   Dorena Dew, FNP    Allergies Patient has no known allergies.   REVIEW OF SYSTEMS  Negative except as noted here or in the History of Present Illness.   PHYSICAL EXAMINATION  Initial Vital Signs Blood pressure (!) 168/86, pulse 70, temperature 98.8 F (37.1 C), temperature source Oral, resp. rate 18, height '4\' 11"'$  (  1.499 m), weight 69.4 kg, SpO2 96 %.  Examination General: Well-developed, well-nourished female in no acute distress; appearance consistent with age of record HENT: normocephalic; atraumatic Eyes: Normal appearance Neck: supple Heart: regular rate and rhythm Lungs: clear to auscultation bilaterally Abdomen: soft; nondistended; nontender; bowel sounds present Back: Lower lumbar tenderness; positive straight leg raise on the right Extremities: No deformity; pain on passive movement of right knee Neurologic: Awake, alert and oriented; motor function intact in all extremities and symmetric; no facial droop Skin: Warm and dry Psychiatric: Normal mood and affect   RESULTS  Summary of this visit's results, reviewed and interpreted by myself:   EKG  Interpretation  Date/Time:    Ventricular Rate:    PR Interval:    QRS Duration:   QT Interval:    QTC Calculation:   R Axis:     Text Interpretation:         Laboratory Studies: No results found for this or any previous visit (from the past 24 hour(s)). Imaging Studies: CT Lumbar Spine Wo Contrast  Result Date: 01/19/2023 CLINICAL DATA:  Initial evaluation for acute low back pain, increased fracture risk EXAM: CT LUMBAR SPINE WITHOUT CONTRAST TECHNIQUE: Multidetector CT imaging of the lumbar spine was performed without intravenous contrast administration. Multiplanar CT image reconstructions were also generated. RADIATION DOSE REDUCTION: This exam was performed according to the departmental dose-optimization program which includes automated exposure control, adjustment of the mA and/or kV according to patient size and/or use of iterative reconstruction technique. COMPARISON:  Prior radiograph from 01/18/2023 FINDINGS: Segmentation: Standard. Lowest well-formed disc space labeled the L5-S1 level. Alignment: Mild levoscoliosis. Trace 2 mm facet mediated anterolisthesis of L4 on L5 and L5 on S1. Vertebrae: Subtle cortical irregularity and buckling extending through the inferior aspect of the L2 vertebral body, suspicious for an acute compression fracture (series 6, image 37). No visible extension through the posterior cortex. No significant height loss or bony retropulsion. No other acute or recent fracture. Chronic compression deformities involving the L3, L4, and L5 vertebral bodies noted. Visualized sacrum and pelvis intact. No worrisome osseous lesions. Paraspinal and other soft tissues: Paraspinous soft tissues demonstrate no acute finding. Mild aortic atherosclerosis. Disc levels: L1-2: Mild disc bulge with bilateral facet hypertrophy. No canal or foraminal stenosis. L2-3: Mild disc bulge. Right worse than left facet hypertrophy. No significant spinal stenosis. Foramina remain patent. L3-4:  Mild diffuse disc bulge. Moderate bilateral facet hypertrophy. Resultant mild-to-moderate canal with bilateral subarticular stenosis. Foramina remain grossly patent. L4-5: Trace anterolisthesis. Mild diffuse disc bulge, asymmetric to the right. Reactive endplate spurring. Moderate to advanced bilateral facet arthrosis. Resultant moderate to severe canal with bilateral subarticular stenosis. Mild right with mild to moderate left L4 foraminal narrowing. L5-S1: Trace anterolisthesis. Mild diffuse disc bulge. Severe bilateral facet arthrosis. Resultant mild to moderate bilateral subarticular stenosis. Central canal remains patent. Moderate bilateral L5 foraminal narrowing. IMPRESSION: 1. Subtle cortical irregularity and buckling extending through the inferior aspect of the L2 vertebral body, suspicious for an acute compression fracture. No significant height loss or bony retropulsion. 2. No other acute osseous abnormality within the lumbar spine. 3. Chronic compression deformities involving the L3, L4, and L5 vertebral bodies. 4. Multifactorial degenerative changes at L4-5 with resultant moderate to severe canal with bilateral subarticular stenosis. 5. Moderate to advanced lower lumbar facet hypertrophy at L4-5 and L5-S1. Aortic Atherosclerosis (ICD10-I70.0). Electronically Signed   By: Jeannine Boga M.D.   On: 01/19/2023 00:24   DG Lumbar Spine Complete  Result Date:  01/18/2023 CLINICAL DATA:  Low back pain for 2 days, initial encounter EXAM: LUMBAR SPINE - COMPLETE 4+ VIEW COMPARISON:  01/09/2007 FINDINGS: Five lumbar type vertebral bodies are well visualized. Chronic compression deformity involving L5 is seen. Similar findings but to a lesser degree are noted at L4. New irregularity is noted along the anterior aspect of the L2 vertebral body when compared with the prior exam. This is suspicious for underlying acute compression fracture. Multilevel facet hypertrophic changes are noted. Mild osteophytic  changes are seen. No soft tissue abnormality is noted. IMPRESSION: Findings suspicious for acute L2 compression deformity with irregularity along the anterior margin of the vertebral body. MRI may be helpful for further evaluation. Electronically Signed   By: Inez Catalina M.D.   On: 01/18/2023 23:34    ED COURSE and MDM  Nursing notes, initial and subsequent vitals signs, including pulse oximetry, reviewed and interpreted by myself.  Vitals:   01/18/23 2238 01/18/23 2239 01/18/23 2239 01/18/23 2240  BP:    (!) 168/86  Pulse:  74  70  Resp:   18 18  Temp:    98.8 F (37.1 C)  TempSrc:    Oral  SpO2:  99%  96%  Weight: 72 kg   69.4 kg  Height: '4\' 11"'$  (1.499 m)   '4\' 11"'$  (1.499 m)   Medications  traMADol (ULTRAM) tablet 50 mg (50 mg Oral Given 01/18/23 2350)   12:31 AM Plain films and CT scans concerning for minimal compression fracture of L2.  Although patient's pain and tenderness were lower than the L2 level, this could account for her acute pain.  The L2 vertebra is otherwise grossly stable and should not require any external support.  The patient is not currently on Mobic and tramadol but she has taken them in the past with good results.   PROCEDURES  Procedures   ED DIAGNOSES     ICD-10-CM   1. Compression fracture of L2 vertebra, initial encounter ([place])  S32.020A     2. Arthritis  M19.90 meloxicam (MOBIC) 7.5 MG tablet         Franceska Strahm, MD 01/19/23 [phone]

## 2023-01-18 NOTE — ED Triage Notes (Addendum)
Patient c/o lower back pain and right knee pain that started last night.   Patient denies falls. Family at bedside translating per patient's request.

## 2023-01-18 NOTE — ED Notes (Signed)
Patient to imaging.

## 2023-01-19 ENCOUNTER — Other Ambulatory Visit: Payer: Self-pay | Admitting: Family Medicine

## 2023-01-19 DIAGNOSIS — M4316 Spondylolisthesis, lumbar region: Secondary | ICD-10-CM | POA: Diagnosis not present

## 2023-01-19 MED ORDER — MELOXICAM 7.5 MG PO TABS: ORAL_TABLET | ORAL | 1 refills | Status: DC

## 2023-01-19 MED ORDER — TRAMADOL HCL 50 MG PO TABS: 50.0000 mg | ORAL_TABLET | Freq: Four times a day (QID) | ORAL | 2 refills | Status: DC | PRN

## 2023-01-20 NOTE — Telephone Encounter (Signed)
Please advise if you want pt to continue

## 2023-01-24 ENCOUNTER — Telehealth: Payer: Self-pay

## 2023-01-24 NOTE — Telephone Encounter (Signed)
        Patient  visited [place] on 01/19/2023     Telephone encounter attempt :  1st  A HIPAA compliant voice message was left requesting a return call.  Instructed patient to call back     [facility] [phone] [address], [place], [place] [address] Phone: [phone] Email: [email or web address]

## 2023-01-26 ENCOUNTER — Emergency Department (HOSPITAL_BASED_OUTPATIENT_CLINIC_OR_DEPARTMENT_OTHER)
Admission: EM | Admit: 2023-01-26 | Discharge: 2023-01-26 | Disposition: A | Discharge status: Home / Self Care | Payer: Medicare Other | Attending: Emergency Medicine | Admitting: Emergency Medicine

## 2023-01-26 ENCOUNTER — Other Ambulatory Visit: Payer: Self-pay

## 2023-01-26 ENCOUNTER — Encounter (HOSPITAL_BASED_OUTPATIENT_CLINIC_OR_DEPARTMENT_OTHER): Payer: Self-pay

## 2023-01-26 ENCOUNTER — Emergency Department (HOSPITAL_BASED_OUTPATIENT_CLINIC_OR_DEPARTMENT_OTHER): Payer: Medicare Other

## 2023-01-26 DIAGNOSIS — R0602 Shortness of breath: Secondary | ICD-10-CM | POA: Diagnosis not present

## 2023-01-26 DIAGNOSIS — M545 Low back pain, unspecified: Secondary | ICD-10-CM | POA: Insufficient documentation

## 2023-01-26 DIAGNOSIS — Z79899 Other long term (current) drug therapy: Secondary | ICD-10-CM | POA: Insufficient documentation

## 2023-01-26 DIAGNOSIS — M62838 Other muscle spasm: Secondary | ICD-10-CM | POA: Diagnosis not present

## 2023-01-26 DIAGNOSIS — I10 Essential (primary) hypertension: Secondary | ICD-10-CM | POA: Insufficient documentation

## 2023-01-26 DIAGNOSIS — M62830 Muscle spasm of back: Secondary | ICD-10-CM | POA: Diagnosis not present

## 2023-01-26 DIAGNOSIS — Z7982 Long term (current) use of aspirin: Secondary | ICD-10-CM | POA: Insufficient documentation

## 2023-01-26 LAB — COMPREHENSIVE METABOLIC PANEL
ALT: 24 U/L (ref 0–44)
AST: 29 U/L (ref 15–41)
Albumin: 4.2 g/dL (ref 3.5–5.0)
Alkaline Phosphatase: 111 U/L (ref 38–126)
Anion gap: 8 (ref 5–15)
BUN: 20 mg/dL (ref 8–23)
CO2: 26 mmol/L (ref 22–32)
Calcium: 9.5 mg/dL (ref 8.9–10.3)
Chloride: 105 mmol/L (ref 98–111)
Creatinine, Ser: 0.52 mg/dL (ref 0.44–1.00)
GFR, Estimated: >60 mL/min (ref >60)
Glucose, Bld: 97 mg/dL (ref 70–99)
Potassium: 3.9 mmol/L (ref 3.5–5.1)
Sodium: 139 mmol/L (ref 135–145)
Total Bilirubin: 0.5 mg/dL (ref 0.3–1.2)
Total Protein: 7.3 g/dL (ref 6.5–8.1)

## 2023-01-26 LAB — CBC WITH DIFFERENTIAL/PLATELET
Abs Immature Granulocytes: 0.03 10*3/uL (ref 0.00–0.07)
Basophils Absolute: 0 10*3/uL (ref 0.0–0.1)
Basophils Relative: 1 %
Eosinophils Absolute: 0.1 10*3/uL (ref 0.0–0.5)
Eosinophils Relative: 1 %
HCT: 40.1 % (ref 36.0–46.0)
Hemoglobin: 13.6 g/dL (ref 12.0–15.0)
Immature Granulocytes: 0 %
Lymphocytes Relative: 23 %
Lymphs Abs: 1.9 10*3/uL (ref 0.7–4.0)
MCH: 31.9 pg (ref 26.0–34.0)
MCHC: 33.9 g/dL (ref 30.0–36.0)
MCV: 93.9 fL (ref 80.0–100.0)
Monocytes Absolute: 0.7 10*3/uL (ref 0.1–1.0)
Monocytes Relative: 8 %
Neutro Abs: 5.4 10*3/uL (ref 1.7–7.7)
Neutrophils Relative %: 67 %
Platelets: 211 10*3/uL (ref 150–400)
RBC: 4.27 MIL/uL (ref 3.87–5.11)
RDW: 13.5 % (ref 11.5–15.5)
WBC: 8.1 10*3/uL (ref 4.0–10.5)
nRBC: 0 % (ref 0.0–0.2)

## 2023-01-26 LAB — MAGNESIUM: Magnesium: 2 mg/dL (ref 1.7–2.4)

## 2023-01-26 LAB — TROPONIN I (HIGH SENSITIVITY): Troponin I (High Sensitivity): 5 ng/L (ref <18)

## 2023-01-26 MED ORDER — METHOCARBAMOL 500 MG PO TABS: 500.0000 mg | ORAL_TABLET | Freq: Two times a day (BID) | ORAL | 0 refills | Status: DC

## 2023-01-26 NOTE — ED Notes (Signed)
Pt verbalized understanding of d/c instructions, meds, and followup care. Denies questions. VSS, no distress noted. Steady gait to exit with all belongings. Translator used for [employer]. Family present.

## 2023-01-26 NOTE — ED Notes (Addendum)
Pt's oxygen level stayed between 94-95 while ambulating. HR 90-95.

## 2023-01-26 NOTE — ED Triage Notes (Addendum)
Patient arrives with complaints of worsening back pain, leg cramps, and difficulty breathing. Patient was seen here recently for the same and discharged with medication. She has been feeling worse and the pain increases with movement. She is returning to be reassessed. Family is concerned that she may have sustained a back injury.    Rates pain 8/10.

## 2023-01-26 NOTE — Discharge Instructions (Signed)
Continue the medications you are taking but also you can try the muscle relaxer for the cramps.  If they just bother you at night then you can take it at night.  You can take it during the day if you need.  You are going to have to modify some of your activity and avoid a lot of bending.  All the blood work today and your heart looked normal.  Your oxygen was good even when you are walking.

## 2023-01-26 NOTE — ED Provider Notes (Signed)
[facility] Provider Note   CSN: [id number] Arrival date & time: 01/26/23  1419     History  Chief Complaint  Patient presents with   Back Pain   Shortness of Breath    Elizabeth Jennings is a 77 y.o. female.  Patient is a 77 year old female with a history of hypertension, hyperlipidemia, recent evaluation in the emergency room 8 days ago with complaint of back pain had a CT that showed a mild L2 compression fracture.  Patient has been using the meloxicam and tramadol intermittently at home but returns today because she is complaining of leg cramps and some shortness of breath.  Patient reports that her back does not typically bother her when she is sitting in bed but when she tries to get up and do things especially if she tries to bend over or put her shoes on she is in a lot of pain.  The shortness of breath is only present if she is up moving around.  It is not present at rest or lying flat.  She has not had any unilateral leg pain or swelling or noticed any edema in her legs.  She does not have a history of lung disease and denies recent URI symptoms such as nasal congestion, rhinorrhea or cough.  She has not had fever or urinary complaints.  Bowel movements have been normal.  The cramps in her legs are debilitating but she denies any numbness or weakness in the legs.  She has not changed any medications other than the tramadol and meloxicam she was given last week.  She reports the pain started after she moved to a new apartment and was moving some furniture.  The history is provided by the patient. The history is limited by a language barrier. A language interpreter was used (family member interpreted).  Back Pain Shortness of Breath      Home Medications Prior to Admission medications   Medication Sig Start Date End Date Taking? Authorizing Provider  methocarbamol (ROBAXIN) 500 MG tablet Take 1 tablet (500 mg total) by mouth 2 (two) times  daily. 01/26/23  Yes Blanchie Dessert, MD  acetaminophen (TYLENOL) 500 MG tablet Take 500 mg by mouth every 6 (six) hours as needed.    [provider]  ascorbic acid (VITAMIN C) 500 MG tablet Take 500 mg by mouth daily.    [provider]  aspirin 81 MG tablet Take 81 mg by mouth daily.    [provider]  azelastine (ASTELIN) 0.1 % nasal spray Place 2 sprays into both nostrils 2 (two) times daily. Use in each nostril as directed 04/19/22   Roney Marion, MD  carboxymethylcellulose (REFRESH PLUS) 0.5 % SOLN 1 drop 3 (three) times daily as needed.    [provider]  cetirizine (ZYRTEC ALLERGY) 10 MG tablet Take 1 tablet (10 mg total) by mouth daily. 02/28/22 02/28/23  Fenton Foy, NP  fluticasone (FLONASE) 50 MCG/ACT nasal spray Place 2 sprays into both nostrils daily. 10/08/22   Dorena Dew, FNP  losartan (COZAAR) 50 MG tablet TAKE 1 TABLET(50 MG) BY MOUTH DAILY 12/03/22   Dorena Dew, FNP  meloxicam (MOBIC) 7.5 MG tablet Take 1 tablet daily for back pain. 01/19/23   Molpus, John, MD  Multiple Vitamin (MULTIVITAMIN) capsule Take 1 capsule by mouth daily.    [provider]  traMADol (ULTRAM) 50 MG tablet Take 1 tablet (50 mg total) by mouth every 6 (six) hours as needed  for severe pain or moderate pain. 01/19/23   Molpus, John, MD  Vitamin D, Ergocalciferol, (DRISDOL) 1.25 MG (50000 UNIT) CAPS capsule TAKE 1 CAPSULE BY MOUTH EVERY 7 DAYS 01/20/23   Dorena Dew, FNP      Allergies    Patient has no known allergies.    Review of Systems   Review of Systems  Respiratory:  Positive for shortness of breath.   Musculoskeletal:  Positive for back pain.    Physical Exam Updated Vital Signs BP (!) 167/77   Pulse 63   Temp 97.9 F (36.6 C) (Oral)   Resp 20   Ht '4\' 11"'$  (1.499 m)   Wt 69.4 kg   SpO2 96%   BMI 30.90 kg/m  Physical Exam Vitals and nursing note reviewed.  Constitutional:      General: She is not in acute  distress.    Appearance: She is well-developed.  HENT:     Head: Normocephalic and atraumatic.  Eyes:     Pupils: Pupils are equal, round, and reactive to light.  Cardiovascular:     Rate and Rhythm: Normal rate and regular rhythm.     Heart sounds: Normal heart sounds. No murmur heard.    No friction rub.  Pulmonary:     Effort: Pulmonary effort is normal. No tachypnea or accessory muscle usage.     Breath sounds: Normal breath sounds. No wheezing or rales.  Abdominal:     General: Bowel sounds are normal. There is no distension.     Palpations: Abdomen is soft.     Tenderness: There is no abdominal tenderness. There is no guarding or rebound.  Musculoskeletal:        General: No tenderness. Normal range of motion.     Lumbar back: Bony tenderness present.       Back:     Right lower leg: No edema.     Left lower leg: No edema.     Comments: No edema  Skin:    General: Skin is warm and dry.     Findings: No rash.  Neurological:     Mental Status: She is alert and oriented to person, place, and time. Mental status is at baseline.     Cranial Nerves: No cranial nerve deficit.     Sensory: No sensory deficit.     Motor: No weakness.  Psychiatric:        Mood and Affect: Mood normal.        Behavior: Behavior normal.     ED Results / Procedures / Treatments   Labs (all labs ordered are listed, but only abnormal results are displayed) Labs Reviewed  CBC WITH DIFFERENTIAL/PLATELET  COMPREHENSIVE METABOLIC PANEL  MAGNESIUM  TROPONIN I (HIGH SENSITIVITY)    EKG EKG Interpretation  Date/Time:  Sunday January 26 2023 14:38:23 EDT Ventricular Rate:  67 PR Interval:  153 QRS Duration: 100 QT Interval:  424 QTC Calculation: 448 R Axis:   -28 Text Interpretation: Sinus rhythm Inferior infarct, old No significant change since last tracing Confirmed by Blanchie Dessert [phone]) on 01/26/2023 3:11:22 PM  Radiology DG Chest Port 1 View  Result Date: 01/26/2023 CLINICAL  DATA:  Shortness of breath EXAM: PORTABLE CHEST 1 VIEW COMPARISON:  10/15/2006 FINDINGS: Mild cardiomegaly. No focal airspace consolidation, pleural effusion, or pneumothorax. No acute bony abnormality. IMPRESSION: No active disease. Electronically Signed   By: Davina Poke D.O.   On: 01/26/2023 16:19    Procedures Procedures    Medications Ordered  in ED Medications - No data to display  ED Course/ Medical Decision Making/ A&P                             Medical Decision Making Amount and/or Complexity of Data Reviewed Independent Historian:     Details: Family member External Data Reviewed: notes. Labs: ordered. Decision-making details documented in ED Course. Radiology: ordered and independent interpretation performed. Decision-making details documented in ED Course. ECG/medicine tests: ordered and independent interpretation performed. Decision-making details documented in ED Course.  Risk Prescription drug management.   Pt with multiple medical problems and comorbidities and presenting today with a complaint that caries a high risk for morbidity and mortality.  Here today with ongoing back pain most likely from the known compression fracture from 8 days ago but also complaining of cramps in her legs as well as dyspnea on exertion.  Patient has no known cardiac disease denies any chest pain.  I independently interpreted patient's EKG which showed no evidence of dysrhythmia or acute changes from prior.  Low suspicion for ACS.  Patient has had no unilateral leg pain or swelling, prior history of DVT and low suspicion for PE at this time.  Will ensure no electrolyte abnormalities as patient is on Cozaar versus new renal changes or anemia.  Discussed with the patient and her family that the pain with bending and activity is most likely going to be present for up to 6 weeks as the bone is healing and she can continue the medications that were given but there is not additional intervention  that can be done unless she has persistent pain discuss kyphoplasty and possible physical therapy.  5:52 PM I independently interpreted patient's labs today and CBC, troponin, CMP, magnesium are all within normal limits.  I have independently visualized and interpreted pt's images today.  Chest x-ray without acute findings today.  Patient ambulated in the department with O2 sats of 94% or greater throughout her walking.  At this time we will treat for muscle spasm and have her continue the current medications for her back pain.  Patient is comfortable with this plan.  Discharged home with family.         Final Clinical Impression(s) / ED Diagnoses Final diagnoses:  Muscle spasm of both lower legs  Acute bilateral low back pain without sciatica    Rx / DC Orders ED Discharge Orders          Ordered    methocarbamol (ROBAXIN) 500 MG tablet  2 times daily        01/26/23 1751              Blanchie Dessert, MD 01/26/23 1753

## 2023-02-04 ENCOUNTER — Telehealth: Payer: Self-pay

## 2023-02-04 NOTE — Telephone Encounter (Signed)
        Patient  visited [facility] on 01/26/2023  for Back Pain  Shortness of Breath.   Telephone encounter attempt :  1st  A HIPAA compliant voice message was left requesting a return call.  Instructed patient to call back at [phone].   [place] Resource Care Guide   [email or web address]  ?? [id number]   Website: [email or web address]  [email or web address]

## 2023-02-06 ENCOUNTER — Telehealth: Payer: Self-pay

## 2023-02-06 NOTE — Telephone Encounter (Signed)
        Patient  visited [facility] on 01/26/2023  for Back Pain  Shortness of Breath.   Telephone encounter attempt :  2nd  A HIPAA compliant voice message was left requesting a return call.  Instructed patient to call back at [phone].   [place] Resource Care Guide   [email or web address]  ?? [id number]   Website: [email or web address]  [email or web address]

## 2023-02-07 ENCOUNTER — Telehealth: Payer: Self-pay

## 2023-02-07 NOTE — Telephone Encounter (Signed)
        Patient  visited [facility] on 01/26/2023  for Back Pain  Shortness of Breath.   Telephone encounter attempt :  3rd  A HIPAA compliant voice message was left requesting a return call.  Instructed patient to call back at [phone].   [place] Resource Care Guide   [email or web address]  ?? [email or web address]   Website: [email or web address]  [email or web address]

## 2023-02-11 ENCOUNTER — Ambulatory Visit: Payer: Medicare Other

## 2023-03-21 ENCOUNTER — Other Ambulatory Visit: Payer: Self-pay | Admitting: Family Medicine

## 2023-03-21 NOTE — Telephone Encounter (Signed)
Please advise if you want the pt to continue on this medication.   Renelda Loma RMA

## 2023-03-25 ENCOUNTER — Ambulatory Visit: Payer: Medicare Other | Admitting: Family Medicine

## 2023-03-25 VITALS — BP 130/81 | HR 90 | Temp 97.3°F | Wt 151.6 lb

## 2023-03-25 DIAGNOSIS — R7303 Prediabetes: Secondary | ICD-10-CM

## 2023-03-25 DIAGNOSIS — Z6837 Body mass index (BMI) 37.0-37.9, adult: Secondary | ICD-10-CM

## 2023-03-25 DIAGNOSIS — Z789 Other specified health status: Secondary | ICD-10-CM

## 2023-03-25 DIAGNOSIS — I10 Essential (primary) hypertension: Secondary | ICD-10-CM | POA: Diagnosis not present

## 2023-03-25 DIAGNOSIS — M810 Age-related osteoporosis without current pathological fracture: Secondary | ICD-10-CM | POA: Diagnosis not present

## 2023-03-25 DIAGNOSIS — E559 Vitamin D deficiency, unspecified: Secondary | ICD-10-CM | POA: Diagnosis not present

## 2023-03-25 MED ORDER — LOSARTAN POTASSIUM 50 MG PO TABS: ORAL_TABLET | ORAL | 1 refills | Status: DC

## 2023-03-25 MED ORDER — METHOCARBAMOL 500 MG PO TABS: 500.0000 mg | ORAL_TABLET | Freq: Two times a day (BID) | ORAL | 0 refills | Status: DC

## 2023-03-25 NOTE — Progress Notes (Signed)
Established Patient Office Visit  Subjective   Patient ID: Elizabeth Jennings, female    DOB: 1946-10-03  Age: 77 y.o. MRN: [id number]  Chief Complaint  Patient presents with   Follow-up    Over all    Elizabeth Jennings is a very pleasant 77 year old female with a medical history significant for hypertension, obesity, hyperlipidemia, vitamin D deficiency, and osteoarthritis that presents for follow-up of chronic conditions.  Utilizing video interpreter, patient primarily Spanish. Patient states that she has been doing fairly well and taking all medications consistently.  She continues to complain of ongoing bilateral knee pain that has been worsening over the past several months.  Patient states that pain is controlled with over-the-counter extra strength Tylenol.  She has been evaluated by orthopedic specialist for this problem in the past but has been lost to follow-up. Patient also has a history of hypertension.  She has been taking medications consistently.  She does not check blood pressure at home.  She denies any shortness of breath, chest pain, or lower extremity swelling.  Patient does not exercise.  She does not follow a low-fat diet divided over small meals throughout the day.  She mostly prepares her own meals.    Patient Active Problem List   Diagnosis Date Noted   Facial swelling 12/25/2022   Primary osteoarthritis of right knee 06/21/2022   Chronic pain of right knee 06/14/2022   Left-sided Bell's palsy 12/12/2020   Ptosis of left eyelid 12/12/2020   Left leg paresthesias 12/12/2020   Refusal of blood transfusions as patient is Jehovah's Witness 02/26/2017   Prediabetes 04/11/2016   Vitamin D deficiency 04/11/2016   Essential hypertension 04/10/2016   Arthritis 04/10/2016   Osteoporosis 04/10/2016   Obesity 04/10/2016   Hyperglycemia 04/10/2016   Environmental allergies 04/10/2016   Language barrier to communication 04/10/2016   Past Medical History:  Diagnosis Date    Bell's palsy    Hemorrhoids    Hyperlipidemia    Hypertension    Osteoporosis    Prediabetes    Past Surgical History:  Procedure Laterality Date   EYE SURGERY  2017   cataracts both eyes    EYE SURGERY  2018   eye lid surgery    Social History   Tobacco Use   Smoking status: Never    Passive exposure: Never   Smokeless tobacco: Never  Vaping Use   Vaping Use: Never used  Substance Use Topics   Alcohol use: No   Drug use: No   Social History   Socioeconomic History   Marital status: Divorced    Spouse name: Not on file   Number of children: Not on file   Years of education: Not on file   Highest education level: Not on file  Occupational History   Not on file  Tobacco Use   Smoking status: Never    Passive exposure: Never   Smokeless tobacco: Never  Vaping Use   Vaping Use: Never used  Substance and Sexual Activity   Alcohol use: No   Drug use: No   Sexual activity: Not Currently    Birth control/protection: None  Other Topics Concern   Not on file  Social History Narrative   Lives with son   Right Handed   Drinks no caffeien   Social Determinants of Health   Financial Resource Strain: Low Risk  (01/16/2023)   Overall Financial Resource Strain (CARDIA)    Difficulty of Paying Living Expenses: Not hard at all  Food Insecurity:  No Food Insecurity (01/16/2023)   Hunger Vital Sign    Worried About Running Out of Food in the Last Year: Never true    Ran Out of Food in the Last Year: Never true  Transportation Needs: No Transportation Needs (01/16/2023)   PRAPARE - Administrator, Civil Service (Medical): No    Lack of Transportation (Non-Medical): No  Physical Activity: Insufficiently Active (01/16/2023)   Exercise Vital Sign    Days of Exercise per Week: 3 days    Minutes of Exercise per Session: 20 min  Stress: No Stress Concern Present (01/16/2023)   [employer] of Occupational Health - Occupational Stress Questionnaire    Feeling  of Stress : Not at all  Social Connections: Moderately Integrated (01/16/2023)   Social Connection and Isolation Panel [NHANES]    Frequency of Communication with Friends and Family: More than three times a week    Frequency of Social Gatherings with Friends and Family: More than three times a week    Attends Religious Services: More than 4 times per year    Active Member of [employer] or Organizations: Yes    Attends Engineer, structural: More than 4 times per year    Marital Status: Divorced  Intimate Partner Violence: Not At Risk (01/16/2023)   Humiliation, Afraid, Rape, and Kick questionnaire    Fear of Current or Ex-Partner: No    Emotionally Abused: No    Physically Abused: No    Sexually Abused: No   Family Status  Relation Name Status   Neg Hx  (Not Specified)   Family History  Problem Relation Age of Onset   Breast cancer Neg Hx    No Known Allergies    Review of Systems  HENT: Negative.    Respiratory: Negative.    Cardiovascular: Negative.   Gastrointestinal: Negative.   Genitourinary: Negative.   Musculoskeletal:  Positive for back pain and joint pain.  Skin: Negative.   Neurological: Negative.   Psychiatric/Behavioral: Negative.        Objective:     BP 130/81   Pulse 90   Temp (!) 97.3 F (36.3 C)   Wt 151 lb 9.6 oz (68.8 kg)   SpO2 98%   BMI 30.62 kg/m    Physical Exam Constitutional:      Appearance: She is obese.  Eyes:     Pupils: Pupils are equal, round, and reactive to light.  Cardiovascular:     Rate and Rhythm: Normal rate and regular rhythm.  Pulmonary:     Effort: Pulmonary effort is normal.  Abdominal:     General: Bowel sounds are normal.  Musculoskeletal:        General: Normal range of motion.  Skin:    General: Skin is warm.  Neurological:     General: No focal deficit present.     Mental Status: She is alert. Mental status is at baseline.  Psychiatric:        Mood and Affect: Mood normal.        Behavior:  Behavior normal.        Thought Content: Thought content normal.        Judgment: Judgment normal.     BP Readings from Last 3 Encounters:  03/25/23 130/81  01/26/23 (!) 167/77  01/19/23 (!) 154/85   Wt Readings from Last 3 Encounters:  03/25/23 151 lb 9.6 oz (68.8 kg)  01/26/23 153 lb (69.4 kg)  01/18/23 153 lb (69.4 kg)  Results for orders placed or performed in visit on 03/25/23  Comprehensive metabolic panel  Result Value Ref Range   Glucose 107 (H) 70 - 99 mg/dL   BUN 26 8 - 27 mg/dL   Creatinine, Ser 1.61 0.57 - 1.00 mg/dL   eGFR 92 >09 UE/AVW/0.98   BUN/Creatinine Ratio 41 (H) 12 - 28   Sodium 141 134 - 144 mmol/L   Potassium 3.8 3.5 - 5.2 mmol/L   Chloride 106 96 - 106 mmol/L   CO2 20 20 - 29 mmol/L   Calcium 8.8 8.7 - 10.3 mg/dL   Total Protein 6.8 6.0 - 8.5 g/dL   Albumin 4.1 3.8 - 4.8 g/dL   Globulin, Total 2.7 1.5 - 4.5 g/dL   Albumin/Globulin Ratio 1.5 1.2 - 2.2   Bilirubin Total 0.5 0.0 - 1.2 mg/dL   Alkaline Phosphatase 117 44 - 121 IU/L   AST 44 (H) 0 - 40 IU/L   ALT 30 0 - 32 IU/L  Hemoglobin A1c  Result Value Ref Range   Hgb A1c MFr Bld 5.8 (H) 4.8 - 5.6 %   Est. average glucose Bld gHb Est-mCnc 120 mg/dL    Last CBC Lab Results  Component Value Date   WBC 8.1 01/26/2023   HGB 13.6 01/26/2023   HCT 40.1 01/26/2023   MCV 93.9 01/26/2023   MCH 31.9 01/26/2023   RDW 13.5 01/26/2023   PLT 211 01/26/2023   Last metabolic panel Lab Results  Component Value Date   GLUCOSE 107 (H) 03/25/2023   NA 141 03/25/2023   K 3.8 03/25/2023   CL 106 03/25/2023   CO2 20 03/25/2023   BUN 26 03/25/2023   CREATININE 0.63 03/25/2023   GFRNONAA >60 01/26/2023   CALCIUM 8.8 03/25/2023   PROT 6.8 03/25/2023   ALBUMIN 4.1 03/25/2023   LABGLOB 2.7 03/25/2023   AGRATIO 1.5 03/25/2023   BILITOT 0.5 03/25/2023   ALKPHOS 117 03/25/2023   AST 44 (H) 03/25/2023   ALT 30 03/25/2023   ANIONGAP 8 01/26/2023   Last lipids Lab Results  Component Value Date    CHOL 130 12/25/2022   HDL 48 12/25/2022   LDLCALC 63 12/25/2022   TRIG 100 12/25/2022   CHOLHDL 2.7 12/25/2022   Last hemoglobin A1c Lab Results  Component Value Date   HGBA1C 5.8 (H) 03/25/2023   Last thyroid functions Lab Results  Component Value Date   TSH 2.350 02/28/2022   Last vitamin D Lab Results  Component Value Date   VD25OH 36.7 12/14/2019   Last vitamin B12 and Folate Lab Results  Component Value Date   VITAMINB12 376 02/22/2020      The 10-year ASCVD risk score (Arnett DK, et al., 2019) is: 20.8%    Assessment & Plan:   Problem List Items Addressed This Visit       Cardiovascular and Mediastinum   Essential hypertension   Relevant Medications   losartan (COZAAR) 50 MG tablet   Other Relevant Orders   Comprehensive metabolic panel (Completed)     Musculoskeletal and Integument   Osteoporosis     Other   Vitamin D deficiency - Primary   Prediabetes   Relevant Orders   Hemoglobin A1c (Completed)   Obesity   Language barrier to communication  1. Essential hypertension BP 130/81   Pulse 90   Temp (!) 97.3 F (36.3 C)   Wt 151 lb 9.6 oz (68.8 kg)   SpO2 98%   BMI 30.62 kg/m  Blood pressure is  at goal today.  No medication changes warranted. - losartan (COZAAR) 50 MG tablet; TAKE 1 TABLET(50 MG) BY MOUTH DAILY  Dispense: 90 tablet; Refill: 1 - Comprehensive metabolic panel  2. Vitamin D deficiency Will continue vitamin D supplementation.  3. Osteoporosis, unspecified osteoporosis type, unspecified pathological fracture presence   4. Prediabetes   - Hemoglobin A1c  5. Class 2 severe obesity due to excess calories with serious comorbidity and body mass index (BMI) of 37.0 to 37.9 in adult [facility]) The patient is asked to make an attempt to improve diet and exercise patterns to aid in medical management of this problem.   6. Language barrier to communication Utilizing video interpreter to assist with communication   Patient will  follow-up in 6 months for hypertension   Nolon Nations  APRN, MSN, FNP-C [facility] [address] [place], [place] [address] [phone]'

## 2023-03-26 ENCOUNTER — Ambulatory Visit: Payer: Medicare Other

## 2023-03-26 LAB — COMPREHENSIVE METABOLIC PANEL
ALT: 30 IU/L (ref 0–32)
AST: 44 IU/L — ABNORMAL HIGH (ref 0–40)
Albumin/Globulin Ratio: 1.5 (ref 1.2–2.2)
Albumin: 4.1 g/dL (ref 3.8–4.8)
Alkaline Phosphatase: 117 IU/L (ref 44–121)
BUN/Creatinine Ratio: 41 — ABNORMAL HIGH (ref 12–28)
BUN: 26 mg/dL (ref 8–27)
Bilirubin Total: 0.5 mg/dL (ref 0.0–1.2)
CO2: 20 mmol/L (ref 20–29)
Calcium: 8.8 mg/dL (ref 8.7–10.3)
Chloride: 106 mmol/L (ref 96–106)
Creatinine, Ser: 0.63 mg/dL (ref 0.57–1.00)
Globulin, Total: 2.7 g/dL (ref 1.5–4.5)
Glucose: 107 mg/dL — ABNORMAL HIGH (ref 70–99)
Potassium: 3.8 mmol/L (ref 3.5–5.2)
Sodium: 141 mmol/L (ref 134–144)
Total Protein: 6.8 g/dL (ref 6.0–8.5)
eGFR: 92 mL/min/{1.73_m2} (ref >59)

## 2023-03-26 LAB — HEMOGLOBIN A1C
Est. average glucose Bld gHb Est-mCnc: 120 mg/dL
Hgb A1c MFr Bld: 5.8 % — ABNORMAL HIGH (ref 4.8–5.6)

## 2023-04-04 ENCOUNTER — Ambulatory Visit
Admission: RE | Admit: 2023-04-04 | Discharge: 2023-04-04 | Disposition: A | Discharge status: Home / Self Care | Payer: Medicare Other | Source: Ambulatory Visit | Attending: Family Medicine | Admitting: Family Medicine

## 2023-04-04 DIAGNOSIS — Z1231 Encounter for screening mammogram for malignant neoplasm of breast: Secondary | ICD-10-CM

## 2023-04-10 ENCOUNTER — Ambulatory Visit: Payer: Medicare Other | Admitting: Orthopaedic Surgery

## 2023-04-11 ENCOUNTER — Other Ambulatory Visit (INDEPENDENT_AMBULATORY_CARE_PROVIDER_SITE_OTHER): Payer: Medicare Other

## 2023-04-11 ENCOUNTER — Ambulatory Visit (INDEPENDENT_AMBULATORY_CARE_PROVIDER_SITE_OTHER): Payer: Medicare Other | Admitting: Orthopaedic Surgery

## 2023-04-11 DIAGNOSIS — M1711 Unilateral primary osteoarthritis, right knee: Secondary | ICD-10-CM | POA: Diagnosis not present

## 2023-04-11 NOTE — Progress Notes (Signed)
Office Visit Note   Patient: Elizabeth Jennings           Date of Birth: 03/03/1946           MRN: [id number] Visit Date: 04/11/2023              Requested by: Elizabeth Maroon, FNP [address] [place],  [place] [address] PCP: Elizabeth Maroon, FNP   Assessment & Plan: Visit Diagnoses:  1. Primary osteoarthritis of right knee     Plan: Impression is severe right knee degenerative joint disease secondary to Osteoarthritis.  Bone on bone joint space narrowing is seen on radiographs with significant varus alignment.  At this point, conservative treatments fail to provide any significant relief and the pain is severely affecting ADLs and quality of life.  Based on treatment options, the patient has elected to move forward with a knee replacement.  We have discussed the surgical risks that include but are not limited to infection, DVT, leg length discrepancy, stiffness, numbness, tingling, incomplete relief of pain.  Recovery and prognosis were also reviewed.    Interpreter present during the encounter.  Language barrier increased complexity of visit.  Anticoagulants: aspirin 81 mg Postop anticoagulation: Aspirin 81 mg Diabetic: No  Nickel allergy: No Prior DVT/PE: No Tobacco use: Yes Clearances needed for surgery: Elizabeth Jennings - PCP Anticipated discharge dispo: Home with family   Follow-Up Instructions: No follow-ups on file.   Orders:  Orders Placed This Encounter  Procedures   XR KNEE 3 VIEW RIGHT   No orders of the defined types were placed in this encounter.     Procedures: No procedures performed   Clinical Data: No additional findings.   Subjective: Chief Complaint  Patient presents with   Right Knee - Pain    HPI Elizabeth Jennings is a very pleasant 77 year old female here for follow-up on right knee pain secondary to end-stage varus DJD.  We last saw her in August and did a cortisone injection which gave a month of relief.  At this point she is not  interested in temporary treatments and would like to move forward with a total knee replacement.  She continues to report severe pain that affects quality of life and ADLs. Review of Systems  Constitutional: Negative.   HENT: Negative.    Eyes: Negative.   Respiratory: Negative.    Cardiovascular: Negative.   Endocrine: Negative.   Musculoskeletal: Negative.   Neurological: Negative.   Hematological: Negative.   Psychiatric/Behavioral: Negative.    All other systems reviewed and are negative.    Objective: Vital Signs: There were no vitals taken for this visit.  Physical Exam Vitals and nursing note reviewed.  Constitutional:      Appearance: She is well-developed.  HENT:     Head: Atraumatic.     Nose: Nose normal.  Eyes:     Extraocular Movements: Extraocular movements intact.  Cardiovascular:     Pulses: Normal pulses.  Pulmonary:     Effort: Pulmonary effort is normal.  Abdominal:     Palpations: Abdomen is soft.  Musculoskeletal:     Cervical back: Neck supple.  Skin:    General: Skin is warm.     Capillary Refill: Capillary refill takes less than 2 seconds.  Neurological:     Mental Status: She is alert. Mental status is at baseline.  Psychiatric:        Behavior: Behavior normal.        Thought Content: Thought content  normal.        Judgment: Judgment normal.     Ortho Exam Exam patient right knee shows varus deformity.  Medial joint line tenderness.  Trace effusion. Specialty Comments:  No specialty comments available.  Imaging: No results found.   PMFS History: Patient Active Problem List   Diagnosis Date Noted   Facial swelling 12/25/2022   Primary osteoarthritis of right knee 06/21/2022   Chronic pain of right knee 06/14/2022   Left-sided Bell's palsy 12/12/2020   Ptosis of left eyelid 12/12/2020   Left leg paresthesias 12/12/2020   Refusal of blood transfusions as patient is Jehovah's Witness 02/26/2017   Prediabetes 04/11/2016    Vitamin D deficiency 04/11/2016   Essential hypertension 04/10/2016   Arthritis 04/10/2016   Osteoporosis 04/10/2016   Obesity 04/10/2016   Hyperglycemia 04/10/2016   Environmental allergies 04/10/2016   Language barrier to communication 04/10/2016   Past Medical History:  Diagnosis Date   Bell's palsy    Hemorrhoids    Hyperlipidemia    Hypertension    Osteoporosis    Prediabetes     Family History  Problem Relation Age of Onset   Breast cancer Neg Hx     Past Surgical History:  Procedure Laterality Date   EYE SURGERY  2017   cataracts both eyes    EYE SURGERY  2018   eye lid surgery    Social History   Occupational History   Not on file  Tobacco Use   Smoking status: Never    Passive exposure: Never   Smokeless tobacco: Never  Vaping Use   Vaping Use: Never used  Substance and Sexual Activity   Alcohol use: No   Drug use: No   Sexual activity: Not Currently    Birth control/protection: None

## 2023-05-12 ENCOUNTER — Ambulatory Visit: Payer: Medicare Other

## 2023-05-19 ENCOUNTER — Other Ambulatory Visit: Payer: Self-pay | Admitting: Family Medicine

## 2023-05-19 NOTE — Pre-Procedure Instructions (Signed)
Surgical Instructions   Your procedure is scheduled on Monday, July 15th. Report to [facility] Main Entrance "A" at 10:00 A.M., then check in with the Admitting office. Any questions or running late day of surgery: call [phone]  Questions prior to your surgery date: call [phone], Monday-Friday, 8am-4pm. If you experience any cold or flu symptoms such as cough, fever, chills, shortness of breath, etc. between now and your scheduled surgery, please notify us at the above number.     Remember:  Do not eat after midnight the night before your surgery  You may drink clear liquids until 09:30 AM the morning of your surgery.   Clear liquids allowed are: Water, Non-Citrus Juices (without pulp), Carbonated Beverages, Clear Tea, Black Coffee Only (NO MILK, CREAM OR POWDERED CREAMER of any kind), and Gatorade.    Patient Instructions  The night before surgery:  No food after midnight. ONLY clear liquids after midnight  The day of surgery (if you do NOT have diabetes):  Drink ONE (1) Pre-Surgery Clear Ensure by 09:30 AM the morning of surgery. Drink in one sitting. Do not sip.  This drink was given to you during your hospital  pre-op appointment visit.  Nothing else to drink after completing the  Pre-Surgery Clear Ensure.          If you have questions, please contact your surgeon's office.    Take these medicines the morning of surgery with A SIP OF WATER  cetirizine (ZYRTEC ALLERGY)  fluticasone (FLONASE)  methocarbamol (ROBAXIN)     May take these medicines IF NEEDED: acetaminophen (TYLENOL)  carboxymethylcellulose (REFRESH PLUS)    Follow your surgeon's instructions on when to stop Aspirin.  If no instructions were given by your surgeon then you will need to call the office to get those instructions.     One week prior to surgery, STOP taking any Aleve, Naproxen, Ibuprofen, Motrin, Advil, Goody's, BC's, all herbal medications, fish oil, and non-prescription  vitamins.                     Do NOT Smoke (Tobacco/Vaping) for 24 hours prior to your procedure.  If you use a CPAP at night, you may bring your mask/headgear for your overnight stay.   You will be asked to remove any contacts, glasses, piercing's, hearing aid's, dentures/partials prior to surgery. Please bring cases for these items if needed.    Patients discharged the day of surgery will not be allowed to drive home, and someone needs to stay with them for 24 hours.  SURGICAL WAITING ROOM VISITATION Patients may have no more than 2 support people in the waiting area - these visitors may rotate.   Pre-op nurse will coordinate an appropriate time for 1 ADULT support person, who may not rotate, to accompany patient in pre-op.  Children under the age of 61 must have an adult with them who is not the patient and must remain in the main waiting area with an adult.  If the patient needs to stay at the hospital during part of their recovery, the visitor guidelines for inpatient rooms apply.  Please refer to the [facility] website for the visitor guidelines for any additional information.   If you received a COVID test during your pre-op visit  it is requested that you wear a mask when out in public, stay away from anyone that may not be feeling well and notify your surgeon if you develop symptoms. If you have been in contact with  anyone that has tested positive in the last 10 days please notify you surgeon.      Pre-operative 5 CHG Bath Instructions   You can play a key role in reducing the risk of infection after surgery. Your skin needs to be as free of germs as possible. You can reduce the number of germs on your skin by washing with CHG (chlorhexidine gluconate) soap before surgery. CHG is an antiseptic soap that kills germs and continues to kill germs even after washing.   DO NOT use if you have an allergy to chlorhexidine/CHG or antibacterial soaps. If your skin becomes reddened or  irritated, stop using the CHG and notify one of our RNs at [phone].   Please shower with the CHG soap starting 4 days before surgery using the following schedule:     Please keep in mind the following:  DO NOT shave, including legs and underarms, starting the day of your first shower.   You may shave your face at any point before/day of surgery.  Place clean sheets on your bed the day you start using CHG soap. Use a clean washcloth (not used since being washed) for each shower. DO NOT sleep with pets once you start using the CHG.   CHG Shower Instructions:  If you choose to wash your hair and private area, wash first with your normal shampoo/soap.  After you use shampoo/soap, rinse your hair and body thoroughly to remove shampoo/soap residue.  Turn the water OFF and apply about 3 tablespoons (45 ml) of CHG soap to a CLEAN washcloth.  Apply CHG soap ONLY FROM YOUR NECK DOWN TO YOUR TOES (washing for 3-5 minutes)  DO NOT use CHG soap on face, private areas, open wounds, or sores.  Pay special attention to the area where your surgery is being performed.  If you are having back surgery, having someone wash your back for you may be helpful. Wait 2 minutes after CHG soap is applied, then you may rinse off the CHG soap.  Pat dry with a clean towel  Put on clean clothes/pajamas   If you choose to wear lotion, please use ONLY the CHG-compatible lotions on the back of this paper.   Additional instructions for the day of surgery: DO NOT APPLY any lotions, deodorants, cologne, or perfumes.   Do not bring valuables to the hospital. [facility] is not responsible for any belongings/valuables. Do not wear nail polish, gel polish, artificial nails, or any other type of covering on natural nails (fingers and toes) Do not wear jewelry or makeup Put on clean/comfortable clothes.  Please brush your teeth.  Ask your nurse before applying any prescription medications to the skin.     CHG  Compatible Lotions   Aveeno Moisturizing lotion  Cetaphil Moisturizing Cream  Cetaphil Moisturizing Lotion  Clairol Herbal Essence Moisturizing Lotion, Dry Skin  Clairol Herbal Essence Moisturizing Lotion, Extra Dry Skin  Clairol Herbal Essence Moisturizing Lotion, Normal Skin  Curel Age Defying Therapeutic Moisturizing Lotion with Alpha Hydroxy  Curel Extreme Care Body Lotion  Curel Soothing Hands Moisturizing Hand Lotion  Curel Therapeutic Moisturizing Cream, Fragrance-Free  Curel Therapeutic Moisturizing Lotion, Fragrance-Free  Curel Therapeutic Moisturizing Lotion, Original Formula  Eucerin Daily Replenishing Lotion  Eucerin Dry Skin Therapy Plus Alpha Hydroxy Crme  Eucerin Dry Skin Therapy Plus Alpha Hydroxy Lotion  Eucerin Original Crme  Eucerin Original Lotion  Eucerin Plus Crme Eucerin Plus Lotion  Eucerin TriLipid Replenishing Lotion  Keri Anti-Bacterial Hand Lotion  Keri Deep Conditioning  Original Lotion Dry Skin Formula Softly Scented  Keri Deep Conditioning Original Lotion, Fragrance Free Sensitive Skin Formula  Keri Lotion Fast Absorbing Fragrance Free Sensitive Skin Formula  Keri Lotion Fast Absorbing Softly Scented Dry Skin Formula  Keri Original Lotion  Keri Skin Renewal Lotion Keri Silky Smooth Lotion  Keri Silky Smooth Sensitive Skin Lotion  Nivea Body Creamy Conditioning Oil  Nivea Body Extra Enriched Lotion  Nivea Body Original Lotion  Nivea Body Sheer Moisturizing Lotion Nivea Crme  Nivea Skin Firming Lotion  NutraDerm 30 Skin Lotion  NutraDerm Skin Lotion  NutraDerm Therapeutic Skin Cream  NutraDerm Therapeutic Skin Lotion  ProShield Protective Hand Cream  Provon moisturizing lotion  Please read over the following fact sheets that you were given.

## 2023-05-19 NOTE — Telephone Encounter (Signed)
Please advise if you want pt to continue. KH 

## 2023-05-20 ENCOUNTER — Encounter (HOSPITAL_COMMUNITY)
Admission: RE | Admit: 2023-05-20 | Discharge: 2023-05-20 | Disposition: A | Discharge status: Home / Self Care | Payer: Medicare Other | Source: Ambulatory Visit | Attending: Orthopaedic Surgery | Admitting: Orthopaedic Surgery

## 2023-05-20 ENCOUNTER — Other Ambulatory Visit: Payer: Self-pay

## 2023-05-20 ENCOUNTER — Encounter (HOSPITAL_COMMUNITY): Payer: Self-pay

## 2023-05-20 VITALS — BP 165/83 | HR 62 | Temp 98.0°F | Resp 17 | Ht <= 58 in | Wt 148.0 lb

## 2023-05-20 DIAGNOSIS — M1711 Unilateral primary osteoarthritis, right knee: Secondary | ICD-10-CM | POA: Insufficient documentation

## 2023-05-20 DIAGNOSIS — Z01818 Encounter for other preprocedural examination: Secondary | ICD-10-CM | POA: Insufficient documentation

## 2023-05-20 HISTORY — DX: Unspecified osteoarthritis, unspecified site: M19.90

## 2023-05-20 LAB — BASIC METABOLIC PANEL
Anion gap: 6 (ref 5–15)
BUN: 11 mg/dL (ref 8–23)
CO2: 26 mmol/L (ref 22–32)
Calcium: 9.1 mg/dL (ref 8.9–10.3)
Chloride: 107 mmol/L (ref 98–111)
Creatinine, Ser: 0.61 mg/dL (ref 0.44–1.00)
GFR, Estimated: >60 mL/min (ref >60)
Glucose, Bld: 96 mg/dL (ref 70–99)
Potassium: 4.1 mmol/L (ref 3.5–5.1)
Sodium: 139 mmol/L (ref 135–145)

## 2023-05-20 LAB — SURGICAL PCR SCREEN
MRSA, PCR: NEGATIVE
Staphylococcus aureus: NEGATIVE

## 2023-05-20 LAB — CBC
HCT: 39.8 % (ref 36.0–46.0)
Hemoglobin: 13 g/dL (ref 12.0–15.0)
MCH: 31 pg (ref 26.0–34.0)
MCHC: 32.7 g/dL (ref 30.0–36.0)
MCV: 95 fL (ref 80.0–100.0)
Platelets: 187 10*3/uL (ref 150–400)
RBC: 4.19 MIL/uL (ref 3.87–5.11)
RDW: 13.7 % (ref 11.5–15.5)
WBC: 7.2 10*3/uL (ref 4.0–10.5)
nRBC: 0 % (ref 0.0–0.2)

## 2023-05-20 LAB — NO BLOOD PRODUCTS

## 2023-05-20 NOTE — Progress Notes (Addendum)
PCP - Julianne Handler, FNP Cardiologist - denies  PPM/ICD - denies   Chest x-ray - 01/26/23 EKG - 01/26/23 Stress Test - denies ECHO - denies Cardiac Cath - denies  Sleep Study - denies   DM- denies  Blood Thinner Instructions: n/a Aspirin Instructions: f/u with surgeon   ERAS Protcol - yes PRE-SURGERY Ensure given at PAT  COVID TEST- n/a   Anesthesia review: no  Patient denies shortness of breath, fever, cough and chest pain at PAT appointment   All instructions explained to the patient, with a verbal understanding of the material. Patient agrees to go over the instructions while at home for a better understanding.  The opportunity to ask questions was provided.   Pt was instructed to call the call center to complete med rec

## 2023-05-26 ENCOUNTER — Other Ambulatory Visit: Payer: Self-pay | Admitting: Physician Assistant

## 2023-05-26 MED ORDER — ASPIRIN 81 MG PO TBEC: 81.0000 mg | DELAYED_RELEASE_TABLET | Freq: Two times a day (BID) | ORAL | 0 refills | Status: AC

## 2023-05-26 MED ORDER — OXYCODONE-ACETAMINOPHEN 5-325 MG PO TABS: 1.0000 | ORAL_TABLET | Freq: Four times a day (QID) | ORAL | 0 refills | Status: DC | PRN

## 2023-05-26 MED ORDER — DOCUSATE SODIUM 100 MG PO CAPS: 100.0000 mg | ORAL_CAPSULE | Freq: Every day | ORAL | 2 refills | Status: AC | PRN

## 2023-05-26 MED ORDER — METHOCARBAMOL 750 MG PO TABS: 750.0000 mg | ORAL_TABLET | Freq: Two times a day (BID) | ORAL | 2 refills | Status: AC | PRN

## 2023-05-26 MED ORDER — ONDANSETRON HCL 4 MG PO TABS: 4.0000 mg | ORAL_TABLET | Freq: Three times a day (TID) | ORAL | 0 refills | Status: AC | PRN

## 2023-05-30 MED ORDER — TRANEXAMIC ACID 1000 MG/10ML IV SOLN
2000.0000 mg | INTRAVENOUS | Status: AC
  Filled 2023-05-30: qty 20

## 2023-06-01 ENCOUNTER — Telehealth: Payer: Self-pay | Admitting: *Deleted

## 2023-06-01 NOTE — Care Plan (Signed)
[facility] RNCM call to patient and attempted to discuss upcoming Right total knee arthroplasty with Dr. Roda Shutters on 06/02/23. CM had multiple numbers for patient and family members. Finally reached patient using her mobile number and interpreter service. Ortho bundle patient through [facility] and is agreeable to case management. She will be staying with a friend after surgery and did not have that address available during conversation, but states she will bring to the hospital. Reviewed that she has not yet received her CPM through [employer], but I am sure they had difficulty reaching patient. Will reach out as well to see if RW and CPM can be delivered. Anticipate HHPT will be needed. Referral to [facility] after choice provided. Will provide address of where patient will be staying once received. Answered questions about pre-op and reviewed post op instructions. Will continue to follow for needs.

## 2023-06-01 NOTE — Telephone Encounter (Signed)
Ortho bundle pre-op call completed. 

## 2023-06-02 ENCOUNTER — Encounter (HOSPITAL_COMMUNITY): Payer: Self-pay | Admitting: Orthopaedic Surgery

## 2023-06-02 ENCOUNTER — Observation Stay (HOSPITAL_COMMUNITY): Payer: Medicare Other

## 2023-06-02 ENCOUNTER — Ambulatory Visit (HOSPITAL_COMMUNITY): Payer: Medicare Other | Admitting: Anesthesiology

## 2023-06-02 ENCOUNTER — Other Ambulatory Visit: Payer: Self-pay

## 2023-06-02 ENCOUNTER — Observation Stay (HOSPITAL_COMMUNITY)
Admission: RE | Admit: 2023-06-02 | Discharge: 2023-06-03 | Disposition: A | Discharge status: Home / Self Care | Payer: Medicare Other | Attending: Orthopaedic Surgery | Admitting: Orthopaedic Surgery

## 2023-06-02 ENCOUNTER — Encounter (HOSPITAL_COMMUNITY)
Admission: RE | Disposition: A | Discharge status: Home / Self Care | Payer: Self-pay | Source: Home / Self Care | Attending: Orthopaedic Surgery

## 2023-06-02 ENCOUNTER — Ambulatory Visit (HOSPITAL_BASED_OUTPATIENT_CLINIC_OR_DEPARTMENT_OTHER): Payer: Medicare Other | Admitting: Anesthesiology

## 2023-06-02 DIAGNOSIS — M1711 Unilateral primary osteoarthritis, right knee: Principal | ICD-10-CM | POA: Diagnosis present

## 2023-06-02 DIAGNOSIS — I10 Essential (primary) hypertension: Secondary | ICD-10-CM | POA: Insufficient documentation

## 2023-06-02 DIAGNOSIS — Z79899 Other long term (current) drug therapy: Secondary | ICD-10-CM | POA: Insufficient documentation

## 2023-06-02 DIAGNOSIS — Z7982 Long term (current) use of aspirin: Secondary | ICD-10-CM | POA: Diagnosis not present

## 2023-06-02 DIAGNOSIS — G8918 Other acute postprocedural pain: Secondary | ICD-10-CM | POA: Diagnosis not present

## 2023-06-02 DIAGNOSIS — Z471 Aftercare following joint replacement surgery: Secondary | ICD-10-CM | POA: Diagnosis not present

## 2023-06-02 DIAGNOSIS — Z96651 Presence of right artificial knee joint: Secondary | ICD-10-CM | POA: Diagnosis not present

## 2023-06-02 HISTORY — PX: TOTAL KNEE ARTHROPLASTY: SHX125

## 2023-06-02 SURGERY — ARTHROPLASTY, KNEE, TOTAL
Anesthesia: General | Site: Knee | Laterality: Right

## 2023-06-02 MED ORDER — LOSARTAN POTASSIUM 50 MG PO TABS
50.0000 mg | ORAL_TABLET | Freq: Every day | ORAL | Status: DC
  Administered 2023-06-02 – 2023-06-03 (×2): 50 mg via ORAL
  Filled 2023-06-02 (×2): qty 1

## 2023-06-02 MED ORDER — MIDAZOLAM HCL 2 MG/2ML IJ SOLN: 1.0000 mg | Freq: Once | INTRAMUSCULAR | Status: AC

## 2023-06-02 MED ORDER — BUPIVACAINE IN DEXTROSE 0.75-8.25 % IT SOLN
INTRATHECAL | Status: DC | PRN
  Administered 2023-06-02: 1.5 mL via INTRATHECAL

## 2023-06-02 MED ORDER — HYDROMORPHONE HCL 1 MG/ML IJ SOLN: 0.2500 mg | INTRAMUSCULAR | Status: DC | PRN

## 2023-06-02 MED ORDER — SODIUM CHLORIDE 0.9 % IR SOLN
Status: DC | PRN
  Administered 2023-06-02: 1000 mL

## 2023-06-02 MED ORDER — ACETAMINOPHEN 500 MG PO TABS
1000.0000 mg | ORAL_TABLET | Freq: Four times a day (QID) | ORAL | Status: AC
  Administered 2023-06-02 – 2023-06-03 (×4): 1000 mg via ORAL
  Filled 2023-06-02 (×4): qty 2

## 2023-06-02 MED ORDER — FENTANYL CITRATE (PF) 100 MCG/2ML IJ SOLN
INTRAMUSCULAR | Status: AC
  Administered 2023-06-02: 50 ug via INTRAVENOUS
  Filled 2023-06-02: qty 2

## 2023-06-02 MED ORDER — ASPIRIN 81 MG PO CHEW
81.0000 mg | CHEWABLE_TABLET | Freq: Two times a day (BID) | ORAL | Status: DC
  Administered 2023-06-02 – 2023-06-03 (×2): 81 mg via ORAL
  Filled 2023-06-02 (×2): qty 1

## 2023-06-02 MED ORDER — FENTANYL CITRATE (PF) 100 MCG/2ML IJ SOLN: 50.0000 ug | Freq: Once | INTRAMUSCULAR | Status: AC

## 2023-06-02 MED ORDER — SODIUM CHLORIDE 0.9 % IV SOLN: INTRAVENOUS | Status: DC

## 2023-06-02 MED ORDER — PHENOL 1.4 % MT LIQD: 1.0000 | OROMUCOSAL | Status: DC | PRN

## 2023-06-02 MED ORDER — MENTHOL 3 MG MT LOZG: 1.0000 | LOZENGE | OROMUCOSAL | Status: DC | PRN

## 2023-06-02 MED ORDER — OXYCODONE HCL ER 10 MG PO T12A
10.0000 mg | EXTENDED_RELEASE_TABLET | Freq: Two times a day (BID) | ORAL | Status: DC
  Administered 2023-06-02 – 2023-06-03 (×2): 10 mg via ORAL
  Filled 2023-06-02 (×2): qty 1

## 2023-06-02 MED ORDER — PRONTOSAN WOUND IRRIGATION OPTIME
TOPICAL | Status: DC | PRN
  Administered 2023-06-02: 1 via TOPICAL

## 2023-06-02 MED ORDER — MIDAZOLAM HCL 2 MG/2ML IJ SOLN
INTRAMUSCULAR | Status: AC
  Administered 2023-06-02: 1 mg via INTRAVENOUS
  Filled 2023-06-02: qty 2

## 2023-06-02 MED ORDER — DEXAMETHASONE SODIUM PHOSPHATE 4 MG/ML IJ SOLN
INTRAMUSCULAR | Status: DC | PRN
  Administered 2023-06-02: 5 mg via PERINEURAL

## 2023-06-02 MED ORDER — OXYCODONE HCL 5 MG PO TABS
5.0000 mg | ORAL_TABLET | ORAL | Status: DC | PRN
  Administered 2023-06-02: 10 mg via ORAL
  Administered 2023-06-03 (×2): 5 mg via ORAL
  Filled 2023-06-02 (×2): qty 1

## 2023-06-02 MED ORDER — FERROUS SULFATE 325 (65 FE) MG PO TABS
325.0000 mg | ORAL_TABLET | Freq: Three times a day (TID) | ORAL | Status: DC
  Administered 2023-06-02 – 2023-06-03 (×2): 325 mg via ORAL
  Filled 2023-06-02 (×2): qty 1

## 2023-06-02 MED ORDER — TRANEXAMIC ACID-NACL 1000-0.7 MG/100ML-% IV SOLN
1000.0000 mg | Freq: Once | INTRAVENOUS | Status: AC
  Administered 2023-06-02: 1000 mg via INTRAVENOUS
  Filled 2023-06-02: qty 100

## 2023-06-02 MED ORDER — KETOROLAC TROMETHAMINE 15 MG/ML IJ SOLN
INTRAMUSCULAR | Status: AC
  Filled 2023-06-02: qty 1

## 2023-06-02 MED ORDER — LACTATED RINGERS IV SOLN: INTRAVENOUS | Status: DC

## 2023-06-02 MED ORDER — DEXAMETHASONE SODIUM PHOSPHATE 10 MG/ML IJ SOLN
10.0000 mg | Freq: Once | INTRAMUSCULAR | Status: DC
  Filled 2023-06-02: qty 1

## 2023-06-02 MED ORDER — ACETAMINOPHEN 325 MG PO TABS: 325.0000 mg | ORAL_TABLET | Freq: Four times a day (QID) | ORAL | Status: DC | PRN

## 2023-06-02 MED ORDER — BUPIVACAINE-MELOXICAM ER 400-12 MG/14ML IJ SOLN
INTRAMUSCULAR | Status: DC | PRN
  Administered 2023-06-02: 400 mg

## 2023-06-02 MED ORDER — HYDROMORPHONE HCL 1 MG/ML IJ SOLN: 0.5000 mg | INTRAMUSCULAR | Status: DC | PRN

## 2023-06-02 MED ORDER — METOCLOPRAMIDE HCL 5 MG PO TABS: 5.0000 mg | ORAL_TABLET | Freq: Three times a day (TID) | ORAL | Status: DC | PRN

## 2023-06-02 MED ORDER — ONDANSETRON HCL 4 MG PO TABS
4.0000 mg | ORAL_TABLET | Freq: Four times a day (QID) | ORAL | Status: DC | PRN
  Administered 2023-06-02 – 2023-06-03 (×2): 4 mg via ORAL
  Filled 2023-06-02 (×2): qty 1

## 2023-06-02 MED ORDER — ONDANSETRON HCL 4 MG/2ML IJ SOLN
INTRAMUSCULAR | Status: AC
  Filled 2023-06-02: qty 2

## 2023-06-02 MED ORDER — CEFAZOLIN SODIUM-DEXTROSE 2-4 GM/100ML-% IV SOLN
2.0000 g | Freq: Four times a day (QID) | INTRAVENOUS | Status: AC
  Administered 2023-06-02 (×2): 2 g via INTRAVENOUS
  Filled 2023-06-02 (×2): qty 100

## 2023-06-02 MED ORDER — OXYCODONE HCL 5 MG PO TABS: 10.0000 mg | ORAL_TABLET | ORAL | Status: DC | PRN

## 2023-06-02 MED ORDER — POVIDONE-IODINE 10 % EX SWAB
2.0000 | Freq: Once | CUTANEOUS | Status: AC
  Administered 2023-06-02: 2 via TOPICAL

## 2023-06-02 MED ORDER — VANCOMYCIN HCL 1000 MG IV SOLR
INTRAVENOUS | Status: DC | PRN
  Administered 2023-06-02: 1000 mg via TOPICAL

## 2023-06-02 MED ORDER — PROPOFOL 10 MG/ML IV BOLUS
INTRAVENOUS | Status: AC
  Filled 2023-06-02: qty 20

## 2023-06-02 MED ORDER — METOCLOPRAMIDE HCL 5 MG/ML IJ SOLN
5.0000 mg | Freq: Three times a day (TID) | INTRAMUSCULAR | Status: DC | PRN
  Administered 2023-06-02: 10 mg via INTRAVENOUS
  Filled 2023-06-02: qty 2

## 2023-06-02 MED ORDER — METHOCARBAMOL 500 MG PO TABS
ORAL_TABLET | ORAL | Status: AC
  Filled 2023-06-02: qty 1

## 2023-06-02 MED ORDER — CLONIDINE HCL (ANALGESIA) 100 MCG/ML EP SOLN
EPIDURAL | Status: DC | PRN
  Administered 2023-06-02: 80 ug

## 2023-06-02 MED ORDER — CEFAZOLIN SODIUM-DEXTROSE 2-4 GM/100ML-% IV SOLN
2.0000 g | INTRAVENOUS | Status: AC
  Administered 2023-06-02: 2 g via INTRAVENOUS
  Filled 2023-06-02: qty 100

## 2023-06-02 MED ORDER — METHOCARBAMOL 500 MG PO TABS
500.0000 mg | ORAL_TABLET | Freq: Four times a day (QID) | ORAL | Status: DC | PRN
  Administered 2023-06-02 – 2023-06-03 (×2): 500 mg via ORAL
  Filled 2023-06-02: qty 1

## 2023-06-02 MED ORDER — TRANEXAMIC ACID-NACL 1000-0.7 MG/100ML-% IV SOLN
INTRAVENOUS | Status: AC
  Filled 2023-06-02: qty 100

## 2023-06-02 MED ORDER — OXYCODONE HCL 5 MG PO TABS
ORAL_TABLET | ORAL | Status: AC
  Filled 2023-06-02: qty 2

## 2023-06-02 MED ORDER — 0.9 % SODIUM CHLORIDE (POUR BTL) OPTIME
TOPICAL | Status: DC | PRN
  Administered 2023-06-02: 1000 mL

## 2023-06-02 MED ORDER — ROPIVACAINE HCL 5 MG/ML IJ SOLN
INTRAMUSCULAR | Status: DC | PRN
  Administered 2023-06-02: 30 mL via PERINEURAL

## 2023-06-02 MED ORDER — PROPOFOL 500 MG/50ML IV EMUL
INTRAVENOUS | Status: DC | PRN
  Administered 2023-06-02: 20 mg via INTRAVENOUS
  Administered 2023-06-02: 75 ug/kg/min via INTRAVENOUS

## 2023-06-02 MED ORDER — ONDANSETRON HCL 4 MG/2ML IJ SOLN
4.0000 mg | Freq: Four times a day (QID) | INTRAMUSCULAR | Status: DC | PRN
  Administered 2023-06-02: 4 mg via INTRAVENOUS

## 2023-06-02 MED ORDER — ACETAMINOPHEN 500 MG PO TABS
1000.0000 mg | ORAL_TABLET | Freq: Once | ORAL | Status: AC
  Administered 2023-06-02: 1000 mg via ORAL
  Filled 2023-06-02: qty 2

## 2023-06-02 MED ORDER — METHOCARBAMOL 1000 MG/10ML IJ SOLN: 500.0000 mg | Freq: Four times a day (QID) | INTRAVENOUS | Status: DC | PRN

## 2023-06-02 MED ORDER — TRANEXAMIC ACID-NACL 1000-0.7 MG/100ML-% IV SOLN
1000.0000 mg | INTRAVENOUS | Status: AC
  Administered 2023-06-02: 1000 mg via INTRAVENOUS
  Filled 2023-06-02: qty 100

## 2023-06-02 MED ORDER — TRANEXAMIC ACID 1000 MG/10ML IV SOLN
INTRAVENOUS | Status: DC | PRN
  Administered 2023-06-02: 2000 mg via TOPICAL

## 2023-06-02 MED ORDER — AMISULPRIDE (ANTIEMETIC) 5 MG/2ML IV SOLN: 10.0000 mg | Freq: Once | INTRAVENOUS | Status: DC | PRN

## 2023-06-02 MED ORDER — DOCUSATE SODIUM 100 MG PO CAPS
100.0000 mg | ORAL_CAPSULE | Freq: Two times a day (BID) | ORAL | Status: DC
  Administered 2023-06-02 – 2023-06-03 (×2): 100 mg via ORAL
  Filled 2023-06-02 (×2): qty 1

## 2023-06-02 MED ORDER — KETOROLAC TROMETHAMINE 15 MG/ML IJ SOLN
7.5000 mg | Freq: Four times a day (QID) | INTRAMUSCULAR | Status: DC
  Administered 2023-06-02 – 2023-06-03 (×3): 7.5 mg via INTRAVENOUS
  Filled 2023-06-02 (×2): qty 1

## 2023-06-02 MED ORDER — PHENYLEPHRINE HCL-NACL 20-0.9 MG/250ML-% IV SOLN
INTRAVENOUS | Status: DC | PRN
  Administered 2023-06-02: 15 ug/min via INTRAVENOUS

## 2023-06-02 SURGICAL SUPPLY — 88 items
ADH SKN CLS APL DERMABOND .7 (GAUZE/BANDAGES/DRESSINGS) ×1
ALCOHOL 70% 16 OZ (MISCELLANEOUS) ×1 IMPLANT
BAG COUNTER SPONGE SURGICOUNT (BAG) IMPLANT
BAG DECANTER FOR FLEXI CONT (MISCELLANEOUS) ×1 IMPLANT
BAG SPNG CNTER NS LX DISP (BAG)
BANDAGE ESMARK 6X9 LF (GAUZE/BANDAGES/DRESSINGS) IMPLANT
BIT DRILL QUICK REL 1/8 2PK SL (BIT) IMPLANT
BLADE SAG 18X100X1.27 (BLADE) ×1 IMPLANT
BLADE SAW SGTL 13X75X1.27 (BLADE) IMPLANT
BLADE SAW SGTL 73X25 THK (BLADE) ×1 IMPLANT
BNDG CMPR 9X6 STRL LF SNTH (GAUZE/BANDAGES/DRESSINGS)
BNDG ESMARK 6X9 LF (GAUZE/BANDAGES/DRESSINGS)
BOWL SMART MIX CTS (DISPOSABLE) ×1 IMPLANT
BSPLAT TIB 5D D CMNT STM RT (Knees) ×1 IMPLANT
CEMENT BONE REFOBACIN R1X40 US (Cement) IMPLANT
CLSR STERI-STRIP ANTIMIC 1/2X4 (GAUZE/BANDAGES/DRESSINGS) ×2 IMPLANT
COMP FEM CEMT PERS SZ7 RT (Joint) ×1 IMPLANT
COMPONENT FEM CEMT PERS SZ7 RT (Joint) IMPLANT
COOLER ICEMAN CLASSIC (MISCELLANEOUS) ×1 IMPLANT
COVER SURGICAL LIGHT HANDLE (MISCELLANEOUS) ×1 IMPLANT
CUFF TOURN SGL QUICK 34 (TOURNIQUET CUFF) ×1
CUFF TOURN SGL QUICK 42 (TOURNIQUET CUFF) IMPLANT
CUFF TRNQT CYL 34X4.125X (TOURNIQUET CUFF) ×1 IMPLANT
DERMABOND ADVANCED .7 DNX12 (GAUZE/BANDAGES/DRESSINGS) ×1 IMPLANT
DRAPE EXTREMITY T 121X128X90 (DISPOSABLE) ×1 IMPLANT
DRAPE HALF SHEET 40X57 (DRAPES) ×1 IMPLANT
DRAPE INCISE IOBAN 66X45 STRL (DRAPES) ×1 IMPLANT
DRAPE ORTHO SPLIT 77X108 STRL (DRAPES)
DRAPE POUCH INSTRU U-SHP 10X18 (DRAPES) ×1 IMPLANT
DRAPE SURG ORHT 6 SPLT 77X108 (DRAPES) IMPLANT
DRAPE U-SHAPE 47X51 STRL (DRAPES) ×2 IMPLANT
DRSG AQUACEL AG ADV 3.5X10 (GAUZE/BANDAGES/DRESSINGS) ×1 IMPLANT
DURAPREP 26ML APPLICATOR (WOUND CARE) ×3 IMPLANT
ELECT CAUTERY BLADE 6.4 (BLADE) ×1 IMPLANT
ELECT PENCIL ROCKER SW 15FT (MISCELLANEOUS) ×1 IMPLANT
ELECT REM PT RETURN 9FT ADLT (ELECTROSURGICAL) ×1
ELECTRODE REM PT RTRN 9FT ADLT (ELECTROSURGICAL) ×1 IMPLANT
GLOVE BIOGEL PI IND STRL 7.0 (GLOVE) ×2 IMPLANT
GLOVE BIOGEL PI IND STRL 7.5 (GLOVE) ×5 IMPLANT
GLOVE ECLIPSE 7.0 STRL STRAW (GLOVE) ×3 IMPLANT
GLOVE INDICATOR 7.0 STRL GRN (GLOVE) ×1 IMPLANT
GLOVE INDICATOR 7.5 STRL GRN (GLOVE) ×1 IMPLANT
GLOVE SURG SYN 7.5 E (GLOVE) ×2 IMPLANT
GLOVE SURG SYN 7.5 PF PI (GLOVE) ×2 IMPLANT
GLOVE SURG UNDER LTX SZ7.5 (GLOVE) ×2 IMPLANT
GLOVE SURG UNDER POLY LF SZ7 (GLOVE) ×2 IMPLANT
GOWN STRL REUS W/ TWL LRG LVL3 (GOWN DISPOSABLE) ×1 IMPLANT
GOWN STRL REUS W/TWL LRG LVL3 (GOWN DISPOSABLE) ×1
GOWN STRL SURGICAL XL XLNG (GOWN DISPOSABLE) ×1 IMPLANT
GOWN TOGA ZIPPER T7+ PEEL AWAY (MISCELLANEOUS) ×2 IMPLANT
GUIDE TRIAL TIB UNI KNEE RU (ORTHOPEDIC DISPOSABLE SUPPLIES) IMPLANT
HANDPIECE INTERPULSE COAX TIP (DISPOSABLE) ×1
HOOD PEEL AWAY T7 (MISCELLANEOUS) ×1 IMPLANT
KIT BASIN OR (CUSTOM PROCEDURE TRAY) ×1 IMPLANT
KIT TURNOVER KIT B (KITS) ×1 IMPLANT
LINER ASF PERS 10X6/7 CD RT (Liner) IMPLANT
MANIFOLD NEPTUNE II (INSTRUMENTS) ×1 IMPLANT
MARKER SKIN DUAL TIP RULER LAB (MISCELLANEOUS) ×2 IMPLANT
NDL SPNL 18GX3.5 QUINCKE PK (NEEDLE) ×1 IMPLANT
NEEDLE SPNL 18GX3.5 QUINCKE PK (NEEDLE) ×1 IMPLANT
NS IRRIG 1000ML POUR BTL (IV SOLUTION) ×1 IMPLANT
PACK TOTAL JOINT (CUSTOM PROCEDURE TRAY) ×1 IMPLANT
PAD ARMBOARD 7.5X6 YLW CONV (MISCELLANEOUS) ×2 IMPLANT
PAD COLD SHLDR WRAP-ON (PAD) ×1 IMPLANT
PIN DRILL HDLS TROCAR 75 4PK (PIN) IMPLANT
SCREW FEMALE HEX FIX 25X2.5 (ORTHOPEDIC DISPOSABLE SUPPLIES) IMPLANT
SET HNDPC FAN SPRY TIP SCT (DISPOSABLE) ×1 IMPLANT
SOLUTION PRONTOSAN WOUND 350ML (IRRIGATION / IRRIGATOR) ×1 IMPLANT
STAPLER VISISTAT 35W (STAPLE) IMPLANT
STEM POLY PAT PLY 29M KNEE (Knees) IMPLANT
STEM TIB ST PERS 14+30 (Stem) IMPLANT
STEM TIBIA 5 DEG SZ D R KNEE (Knees) IMPLANT
SUCTION TUBE FRAZIER 10FR DISP (SUCTIONS) ×1 IMPLANT
SUT ETHILON 2 0 FS 18 (SUTURE) IMPLANT
SUT MNCRL AB 3-0 PS2 27 (SUTURE) IMPLANT
SUT VIC AB 0 CT1 27 (SUTURE) ×2
SUT VIC AB 0 CT1 27XBRD ANBCTR (SUTURE) ×2 IMPLANT
SUT VIC AB 1 CTX 27 (SUTURE) ×3 IMPLANT
SUT VIC AB 2-0 CT1 27 (SUTURE) ×4
SUT VIC AB 2-0 CT1 TAPERPNT 27 (SUTURE) ×4 IMPLANT
SYR 50ML LL SCALE MARK (SYRINGE) ×2 IMPLANT
TIBIA STEM 5 DEG SZ D R KNEE (Knees) ×1 IMPLANT
TOWEL GREEN STERILE (TOWEL DISPOSABLE) ×1 IMPLANT
TOWEL GREEN STERILE FF (TOWEL DISPOSABLE) ×1 IMPLANT
TRAY CATH INTERMITTENT SS 16FR (CATHETERS) IMPLANT
TUBE SUCT ARGYLE STRL (TUBING) ×1 IMPLANT
UNDERPAD 30X36 HEAVY ABSORB (UNDERPADS AND DIAPERS) ×1 IMPLANT
YANKAUER SUCT BULB TIP NO VENT (SUCTIONS) ×2 IMPLANT

## 2023-06-02 NOTE — Plan of Care (Signed)
  Problem: Education: Goal: Knowledge of General Education information will improve Description: Including pain rating scale, medication(s)/side effects and non-pharmacologic comfort measures Outcome: Completed/Met   Problem: Health Behavior/Discharge Planning: Goal: Ability to manage health-related needs will improve Outcome: Completed/Met   Problem: Clinical Measurements: Goal: Ability to maintain clinical measurements within normal limits will improve Outcome: Completed/Met Goal: Will remain free from infection Outcome: Completed/Met Goal: Diagnostic test results will improve Outcome: Completed/Met Goal: Respiratory complications will improve Outcome: Completed/Met Goal: Cardiovascular complication will be avoided Outcome: Completed/Met   Problem: Activity: Goal: Risk for activity intolerance will decrease Outcome: Completed/Met   Problem: Nutrition: Goal: Adequate nutrition will be maintained Outcome: Completed/Met   Problem: Coping: Goal: Level of anxiety will decrease Outcome: Completed/Met   Problem: Elimination: Goal: Will not experience complications related to bowel motility Outcome: Completed/Met Goal: Will not experience complications related to urinary retention Outcome: Completed/Met   Problem: Pain Managment: Goal: General experience of comfort will improve Outcome: Completed/Met   Problem: Safety: Goal: Ability to remain free from injury will improve Outcome: Completed/Met   Problem: Skin Integrity: Goal: Risk for impaired skin integrity will decrease Outcome: Completed/Met   Problem: Education: Goal: Knowledge of the prescribed therapeutic regimen will improve Outcome: Completed/Met   Problem: Activity: Goal: Ability to avoid complications of mobility impairment will improve Outcome: Completed/Met Goal: Range of joint motion will improve Outcome: Completed/Met   Problem: Clinical Measurements: Goal: Postoperative complications will be  avoided or minimized Outcome: Completed/Met   Problem: Pain Management: Goal: Pain level will decrease with appropriate interventions Outcome: Completed/Met   Problem: Skin Integrity: Goal: Will show signs of wound healing Outcome: Completed/Met

## 2023-06-02 NOTE — Anesthesia Procedure Notes (Addendum)
Spinal  Patient location during procedure: OR Start time: 06/02/2023 12:48 PM End time: 06/02/2023 12:57 PM Reason for block: surgical anesthesia Staffing Performed: anesthesiologist and other anesthesia staff  Anesthesiologist: Lewie Loron, MD Other anesthesia staff: Lind Guest, RN Performed by: Lewie Loron, MD Authorized by: Lewie Loron, MD   Preanesthetic Checklist Completed: patient identified, IV checked, site marked, risks and benefits discussed, surgical consent, monitors and equipment checked, pre-op evaluation and timeout performed Spinal Block Patient position: sitting Prep: DuraPrep and site prepped and draped Patient monitoring: heart rate, continuous pulse ox and blood pressure Approach: right paramedian Location: L2-3 Injection technique: single-shot Needle Needle type: Spinocan  Needle gauge: 25 G Needle length: 9 cm Assessment Events: second provider Additional Notes Expiration date of kit checked and confirmed. Patient tolerated procedure well, without complications.

## 2023-06-02 NOTE — Progress Notes (Signed)
PT Cancellation Note  Patient Details Name: Elizabeth Jennings MRN: [id number] DOB: 23-Aug-1946   Cancelled Treatment:    Reason Eval/Treat Not Completed: Medical issues which prohibited therapy. Pt vomiting upon PT arrival. PT will follow up tomorrow morning.   Arlyss Gandy 06/02/2023, 5:40 PM

## 2023-06-02 NOTE — Op Note (Signed)
Total Knee Arthroplasty Procedure Note  Preoperative diagnosis: Right knee osteoarthritis  Postoperative diagnosis:same  Operative findings: Osteopenic/osteoporotic bone Complete loss of articular cartilage Mild flexion contracture  Operative procedure: Right total knee arthroplasty. CPT [phone]  Surgeon: N. Glee Arvin, MD  Assist: Oneal Grout, PA-C; necessary for the timely completion of procedure and due to complexity of procedure.  Anesthesia: Spinal, regional, local  Tourniquet time: see anesthesia record  Implants used: Zimmer persona cemented Femur: CR 7 narrow Tibia: D with 30 mm cemented stem Patella: 29 mm Polyethylene: 10 mm medial congruent  Indication: Elizabeth Jennings is a 77 y.o. year old female with a history of knee pain. Having failed conservative management, the patient elected to proceed with a total knee arthroplasty.  We have reviewed the risk and benefits of the surgery and they elected to proceed after voicing understanding.  Procedure:  After informed consent was obtained and understanding of the risk were voiced including but not limited to bleeding, infection, damage to surrounding structures including nerves and vessels, blood clots, leg length inequality and the failure to achieve desired results, the operative extremity was marked with verbal confirmation of the patient in the holding area.   The patient was then brought to the operating room and transported to the operating room table in the supine position.  A tourniquet was applied to the operative extremity around the upper thigh. The operative limb was then prepped and draped in the usual sterile fashion and preoperative antibiotics were administered.  A time out was performed prior to the start of surgery confirming the correct extremity, preoperative antibiotic administration, as well as team members, implants and instruments available for the case. Correct surgical site was also  confirmed with preoperative radiographs. The limb was then elevated for exsanguination and the tourniquet was inflated. A midline incision was made and a standard medial parapatellar approach was performed.  The infrapatellar fat pad was removed.  Suprapatellar synovium was removed to reveal the anterior distal femoral cortex.  A medial peel was performed to release the capsule of the medial tibial plateau.  The patella was then everted and was prepared and sized to a 29 mm.  A cover was placed on the patella for protection from retractors.  The knee was then brought into full flexion and we then turned our attention to the femur.  The cruciates were sacrificed.  Start site was drilled in the femur and the intramedullary distal femoral cutting guide was placed, set at 5 degrees valgus, taking 11 mm of distal resection. The distal cut was made. Osteophytes were then removed.  Next, the proximal tibial cutting guide was placed with appropriate slope, varus/valgus alignment and depth of resection. The proximal tibial cut was made taking 2 mm off the low side. Gap blocks were then used to assess the extension gap and alignment, and appropriate soft tissue releases were performed. Attention was turned back to the femur, which was sized using the sizing guide to a size 7 narrow. Appropriate rotation of the femoral component was determined using epicondylar axis, Whiteside's line, and assessing the flexion gap under ligament tension. The appropriate size 4-in-1 cutting block was placed and checked with an angel wing and cuts were made. Posterior femoral osteophytes and uncapped bone were then removed with the curved osteotome.  Trial components were placed, and stability was checked in full extension, mid-flexion, and deep flexion. Proper tibial rotation was determined and marked.  The patella tracked well without a lateral release.  The  femoral lugs were then drilled. Trial components were then removed and tibial  preparation performed.  The tibia was sized for a size D component.   The bony surfaces were irrigated with a pulse lavage and then dried. Bone cement was vacuum mixed on the back table, and the final components sized above were cemented into place.  Antibiotic irrigation was placed in the knee joint and soft tissues while the cement cured.  After cement had finished curing, excess cement was removed. The stability of the construct was re-evaluated throughout a range of motion and found to be acceptable. The trial liner was removed, the knee was copiously irrigated, and the knee was re-evaluated for any excess bone debris. The real polyethylene liner, 10 mm thick, was inserted and checked to ensure the locking mechanism had engaged appropriately. The tourniquet was deflated and hemostasis was achieved. The wound was irrigated with normal saline.  One gram of vancomycin powder was placed in the surgical bed.  Topical 0.25% bupivacaine and meloxicam was placed in the joint for postoperative pain.  Capsular closure was performed with a #1 vicryl, subcutaneous fat closed with a 0 vicryl suture, then subcutaneous tissue closed with interrupted 2.0 vicryl suture. The skin was then closed with a 2.0 nylon and dermabond. A sterile dressing was applied.  The patient was awakened in the operating room and taken to recovery in stable condition. All sponge, needle, and instrument counts were correct at the end of the case.  Elizabeth Jennings was necessary for opening, closing, retracting, limb positioning and overall facilitation and completion of the surgery.  Position: supine  Complications: none.  Time Out: performed   Drains/Packing: none  Estimated blood loss: minimal  Returned to Recovery Room: in good condition.   Antibiotics: yes   Mechanical VTE (DVT) Prophylaxis: sequential compression devices, TED thigh-high  Chemical VTE (DVT) Prophylaxis: aspirin  Fluid Replacement  Crystalloid: see anesthesia  record Blood: none  FFP: none   Specimens Removed: 1 to pathology   Sponge and Instrument Count Correct? yes   PACU: portable radiograph - knee AP and Lateral   Plan/RTC: Return in 2 weeks for wound check.   Weight Bearing/Load Lower Extremity: full   Implant Name Type Inv. Item Serial No. Manufacturer Lot No. LRB No. Used Action  CEMENT BONE REFOBACIN [id number] [place] - [id number] Cement CEMENT BONE REFOBACIN [id number] [place]  ZIMMER RECON(ORTH,TRAU,BIO,SG) [id number] Right 1 Implanted  STEM TIB ST PERS 14+30 - [id number] Stem STEM TIB ST PERS 14+30  ZIMMER RECON(ORTH,TRAU,BIO,SG) [id number] Right 1 Implanted  LINER ASF PERS 10X6/7 CD RT - [id number] Liner LINER ASF PERS 10X6/7 CD RT  ZIMMER RECON(ORTH,TRAU,BIO,SG) [id number] Right 1 Implanted  BSPLAT TIB 5D D CMNT STM RT - [id number] Knees BSPLAT TIB 5D D CMNT STM RT  ZIMMER RECON(ORTH,TRAU,BIO,SG) [id number] Right 1 Implanted  COMP FEM CEMT PERS SZ7 RT - [id number] Joint COMP FEM CEMT PERS SZ7 RT  ZIMMER RECON(ORTH,TRAU,BIO,SG) [id number] Right 1 Implanted  STEM POLY PAT PLY 70M KNEE - [id number] Knees STEM POLY PAT PLY 70M KNEE  ZIMMER RECON(ORTH,TRAU,BIO,SG) [id number] Right 1 Implanted    N. Glee Arvin, MD [facility] 2:16 PM

## 2023-06-02 NOTE — Discharge Instructions (Signed)

## 2023-06-02 NOTE — Anesthesia Procedure Notes (Signed)
Anesthesia Regional Block: Adductor canal block   Pre-Anesthetic Checklist: , timeout performed,  Correct Patient, Correct Site, Correct Laterality,  Correct Procedure, Correct Position, site marked,  Risks and benefits discussed,  Surgical consent,  Pre-op evaluation,  At surgeon's request and post-op pain management  Laterality: Lower and Right  Prep: chloraprep       Needles:  Injection technique: Single-shot  Needle Type: Stimiplex     Needle Length: 9cm  Needle Gauge: 21     Additional Needles:   Procedures:,,,, ultrasound used (permanent image in chart),,    Narrative:  Start time: 06/02/2023 11:35 AM End time: 06/02/2023 11:55 AM Injection made incrementally with aspirations every 5 mL.  Performed by: Personally  Anesthesiologist: Lewie Loron, MD  Additional Notes: BP cuff, EKG monitors applied. Sedation begun. Artery and nerve location verified with ultrasound. Anesthetic injected incrementally (5ml), slowly, and after negative aspirations under direct u/s guidance. Good fascial/perineural spread. Tolerated well.

## 2023-06-02 NOTE — Plan of Care (Signed)
   Problem: Education: Goal: Knowledge of General Education information will improve Description: Including pain rating scale, medication(s)/side effects and non-pharmacologic comfort measures Outcome: Completed/Met   Problem: Health Behavior/Discharge Planning: Goal: Ability to manage health-related needs will improve Outcome: Completed/Met   Problem: Clinical Measurements: Goal: Ability to maintain clinical measurements within normal limits will improve Outcome: Completed/Met Goal: Will remain free from infection Outcome: Completed/Met Goal: Diagnostic test results will improve Outcome: Completed/Met Goal: Respiratory complications will improve Outcome: Completed/Met Goal: Cardiovascular complication will be avoided Outcome: Completed/Met   Problem: Activity: Goal: Risk for activity intolerance will decrease Outcome: Completed/Met   Problem: Nutrition: Goal: Adequate nutrition will be maintained Outcome: Completed/Met   Problem: Coping: Goal: Level of anxiety will decrease Outcome: Completed/Met   Problem: Elimination: Goal: Will not experience complications related to bowel motility Outcome: Completed/Met Goal: Will not experience complications related to urinary retention Outcome: Completed/Met   Problem: Pain Managment: Goal: General experience of comfort will improve Outcome: Completed/Met   Problem: Safety: Goal: Ability to remain free from injury will improve Outcome: Completed/Met   Problem: Skin Integrity: Goal: Risk for impaired skin integrity will decrease Outcome: Completed/Met   Problem: Education: Goal: Knowledge of the prescribed therapeutic regimen will improve Outcome: Completed/Met Goal: Individualized Educational Video(s) Outcome: Completed/Met   Problem: Activity: Goal: Ability to avoid complications of mobility impairment will improve Outcome: Completed/Met Goal: Range of joint motion will improve Outcome: Completed/Met   Problem:  Clinical Measurements: Goal: Postoperative complications will be avoided or minimized Outcome: Completed/Met   Problem: Pain Management: Goal: Pain level will decrease with appropriate interventions Outcome: Completed/Met   Problem: Skin Integrity: Goal: Will show signs of wound healing Outcome: Completed/Met   

## 2023-06-02 NOTE — Transfer of Care (Signed)
Immediate Anesthesia Transfer of Care Note  Patient: Elizabeth Jennings  Procedure(s) Performed: RIGHT TOTAL KNEE ARTHROPLASTY (Right: Knee)  Patient Location: PACU  Anesthesia Type:MAC combined with regional for post-op pain  Level of Consciousness: alert  and patient cooperative  Airway & Oxygen Therapy: Patient Spontanous Breathing and Patient connected to face mask oxygen  Post-op Assessment: Report given to RN and Post -op Vital signs reviewed and stable  Post vital signs: Reviewed and stable  Last Vitals:  Vitals Value Taken Time  BP 138/71 06/02/23 1456  Temp 97.3   Pulse 55 06/02/23 1458  Resp 16 06/02/23 1458  SpO2 94 % 06/02/23 1458  Vitals shown include unfiled device data.  Last Pain:  Vitals:   06/02/23 1043  TempSrc:   PainSc: 7       Patients Stated Pain Goal: 2 (06/02/23 1043)  Complications: No notable events documented.

## 2023-06-02 NOTE — Anesthesia Preprocedure Evaluation (Addendum)
Anesthesia Evaluation  Patient identified by MRN, date of birth, ID band Patient awake    Reviewed: Allergy & Precautions, NPO status , Patient's Chart, lab work & pertinent test results  Airway Mallampati: II  TM Distance: >3 FB Neck ROM: Full    Dental no notable dental hx. (+) Dental Advisory Given   Pulmonary neg pulmonary ROS   Pulmonary exam normal breath sounds clear to auscultation       Cardiovascular hypertension, Normal cardiovascular exam Rhythm:Regular Rate:Normal     Neuro/Psych negative neurological ROS     GI/Hepatic negative GI ROS, Neg liver ROS,,,  Endo/Other  negative endocrine ROS    Renal/GU negative Renal ROS     Musculoskeletal  (+) Arthritis ,    Abdominal   Peds  Hematology negative hematology ROS (+)   Anesthesia Other Findings   Reproductive/Obstetrics                              Anesthesia Physical Anesthesia Plan  ASA: 2  Anesthesia Plan: Spinal   Post-op Pain Management: Tylenol PO (pre-op)* and Regional block*   Induction:   PONV Risk Score and Plan: 3 and TIVA, Propofol infusion, Treatment may vary due to age or medical condition and Ondansetron  Airway Management Planned: Natural Airway  Additional Equipment:   Intra-op Plan:   Post-operative Plan:   Informed Consent: I have reviewed the patients History and Physical, chart, labs and discussed the procedure including the risks, benefits and alternatives for the proposed anesthesia with the patient or authorized representative who has indicated his/her understanding and acceptance.     Dental advisory given  Plan Discussed with: CRNA  Anesthesia Plan Comments:          Anesthesia Quick Evaluation

## 2023-06-02 NOTE — H&P (Signed)
PREOPERATIVE H&P  Chief Complaint: right knee osteoarthritis  HPI: Jeffie Widdowson is a 77 y.o. female who presents for surgical treatment of right knee osteoarthritis.  She denies any changes in medical history.  Past Medical History:  Diagnosis Date   Bell's palsy    Hemorrhoids    Hyperlipidemia    Hypertension    Osteoarthritis    right knee   Osteoporosis    Prediabetes    Past Surgical History:  Procedure Laterality Date   EYE SURGERY  2017   cataracts both eyes    EYE SURGERY  2018   eye lid surgery    Social History   Socioeconomic History   Marital status: Divorced    Spouse name: Not on file   Number of children: 2   Years of education: Not on file   Highest education level: Not on file  Occupational History   Not on file  Tobacco Use   Smoking status: Never    Passive exposure: Never   Smokeless tobacco: Never  Vaping Use   Vaping status: Never Used  Substance and Sexual Activity   Alcohol use: No   Drug use: No   Sexual activity: Not Currently    Birth control/protection: None  Other Topics Concern   Not on file  Social History Narrative      Right Handed   Drinks no caffeine   Social Determinants of Health   Financial Resource Strain: Low Risk  (01/16/2023)   Overall Financial Resource Strain (CARDIA)    Difficulty of Paying Living Expenses: Not hard at all  Food Insecurity: No Food Insecurity (01/16/2023)   Hunger Vital Sign    Worried About Running Out of Food in the Last Year: Never true    Ran Out of Food in the Last Year: Never true  Transportation Needs: No Transportation Needs (01/16/2023)   PRAPARE - Administrator, Civil Service (Medical): No    Lack of Transportation (Non-Medical): No  Physical Activity: Insufficiently Active (01/16/2023)   Exercise Vital Sign    Days of Exercise per Week: 3 days    Minutes of Exercise per Session: 20 min  Stress: No Stress Concern Present (01/16/2023)   [employer] of  Occupational Health - Occupational Stress Questionnaire    Feeling of Stress : Not at all  Social Connections: Moderately Integrated (01/16/2023)   Social Connection and Isolation Panel [NHANES]    Frequency of Communication with Friends and Family: More than three times a week    Frequency of Social Gatherings with Friends and Family: More than three times a week    Attends Religious Services: More than 4 times per year    Active Member of [employer] or Organizations: Yes    Attends Engineer, structural: More than 4 times per year    Marital Status: Divorced   Family History  Problem Relation Age of Onset   Breast cancer Neg Hx    No Known Allergies Prior to Admission medications   Medication Sig Start Date End Date Taking? Authorizing Provider  ascorbic acid (VITAMIN C) 500 MG tablet Take 500 mg by mouth daily.   Yes [provider]  aspirin 81 MG tablet Take 81 mg by mouth daily.   Yes [provider]  aspirin EC 81 MG tablet Take 1 tablet (81 mg total) by mouth 2 (two) times daily. To be taken after surgery to prevent  blood clots 05/26/23 05/25/24  Cristie Hem, PA-C  carboxymethylcellulose (REFRESH PLUS) 0.5 % SOLN 1 drop 3 (three) times daily as needed.   Yes [provider]  docusate sodium (COLACE) 100 MG capsule Take 1 capsule (100 mg total) by mouth daily as needed. 05/26/23 05/25/24  Cristie Hem, PA-C  losartan (COZAAR) 50 MG tablet TAKE 1 TABLET(50 MG) BY MOUTH DAILY 03/25/23  Yes Massie Maroon, FNP  methocarbamol (ROBAXIN-750) 750 MG tablet Take 1 tablet (750 mg total) by mouth 2 (two) times daily as needed for muscle spasms. 05/26/23   Cristie Hem, PA-C  Multiple Vitamin (MULTIVITAMIN) capsule Take 1 capsule by mouth daily.   Yes [provider]  ondansetron (ZOFRAN) 4 MG tablet Take 1 tablet (4 mg total) by mouth every 8 (eight) hours as needed for nausea or vomiting. 05/26/23   Cristie Hem, PA-C  oxyCODONE-acetaminophen  (PERCOCET) 5-325 MG tablet Take 1-2 tablets by mouth every 6 (six) hours as needed. To be taken after surgery 05/26/23   Cristie Hem, PA-C  Vitamin D, Ergocalciferol, (DRISDOL) 1.25 MG (50000 UNIT) CAPS capsule TAKE 1 CAPSULE BY MOUTH EVERY 7 DAYS 05/19/23  Yes Massie Maroon, FNP  acetaminophen (TYLENOL) 500 MG tablet Take 500 mg by mouth every 6 (six) hours as needed.    [provider]  azelastine (ASTELIN) 0.1 % nasal spray Place 2 sprays into both nostrils 2 (two) times daily. Use in each nostril as directed Patient not taking: Reported on 03/25/2023 04/19/22   Ferol Luz, MD  cetirizine (ZYRTEC ALLERGY) 10 MG tablet Take 1 tablet (10 mg total) by mouth daily. 02/28/22 03/25/23  Ivonne Andrew, NP  fluticasone (FLONASE) 50 MCG/ACT nasal spray Place 2 sprays into both nostrils daily. 10/08/22   Massie Maroon, FNP  meloxicam (MOBIC) 7.5 MG tablet Take 1 tablet daily for back pain. Patient not taking: Reported on 03/25/2023 01/19/23   Molpus, Jonny Ruiz, MD  methocarbamol (ROBAXIN) 500 MG tablet Take 1 tablet (500 mg total) by mouth 2 (two) times daily. 03/25/23   Massie Maroon, FNP  traMADol (ULTRAM) 50 MG tablet Take 1 tablet (50 mg total) by mouth every 6 (six) hours as needed for severe pain or moderate pain. Patient not taking: Reported on 03/25/2023 01/19/23   Molpus, John, MD     Positive ROS: All other systems have been reviewed and were otherwise negative with the exception of those mentioned in the HPI and as above.  Physical Exam: General: Alert, no acute distress Cardiovascular: No pedal edema Respiratory: No cyanosis, no use of accessory musculature GI: abdomen soft Skin: No lesions in the area of chief complaint Neurologic: Sensation intact distally Psychiatric: Patient is competent for consent with normal mood and affect Lymphatic: no lymphedema  MUSCULOSKELETAL: exam stable  Assessment: right knee osteoarthritis  Plan: Plan for Procedure(s): RIGHT TOTAL KNEE  ARTHROPLASTY  The risks benefits and alternatives were discussed with the patient including but not limited to the risks of nonoperative treatment, versus surgical intervention including infection, bleeding, nerve injury,  blood clots, cardiopulmonary complications, morbidity, mortality, among others, and they were willing to proceed.   Glee Arvin, MD 06/02/2023 11:37 AM

## 2023-06-03 ENCOUNTER — Encounter (HOSPITAL_COMMUNITY): Payer: Self-pay | Admitting: Orthopaedic Surgery

## 2023-06-03 DIAGNOSIS — Z7982 Long term (current) use of aspirin: Secondary | ICD-10-CM | POA: Diagnosis not present

## 2023-06-03 DIAGNOSIS — M1711 Unilateral primary osteoarthritis, right knee: Secondary | ICD-10-CM | POA: Diagnosis not present

## 2023-06-03 DIAGNOSIS — I10 Essential (primary) hypertension: Secondary | ICD-10-CM | POA: Diagnosis not present

## 2023-06-03 DIAGNOSIS — Z79899 Other long term (current) drug therapy: Secondary | ICD-10-CM | POA: Diagnosis not present

## 2023-06-03 LAB — CBC
HCT: 36.6 % (ref 36.0–46.0)
Hemoglobin: 12.3 g/dL (ref 12.0–15.0)
MCH: 31 pg (ref 26.0–34.0)
MCHC: 33.6 g/dL (ref 30.0–36.0)
MCV: 92.2 fL (ref 80.0–100.0)
Platelets: 159 10*3/uL (ref 150–400)
RBC: 3.97 MIL/uL (ref 3.87–5.11)
RDW: 13.2 % (ref 11.5–15.5)
WBC: 9.6 10*3/uL (ref 4.0–10.5)
nRBC: 0 % (ref 0.0–0.2)

## 2023-06-03 NOTE — Progress Notes (Signed)
OT NOTE  OT NOTE  OT evaluation completed at this time. OT formal evaluation to follow. Pt currently at adequate level for dc home with famiil and recommendation for HHOT/youth Elizabeth Jennings   OTR/L Pager: [phone] Office: [phone]

## 2023-06-03 NOTE — Evaluation (Signed)
Physical Therapy Evaluation Patient Details Name: Elizabeth Jennings MRN: [id number] DOB: [birth year] Today's Date: 06/03/2023  History of Present Illness  77 y.o. female presenting s/p R TKA (07/15). PMH significant for: Bell's Palsy, OA, prediabetes HLD, HTN, Osteoporosis.  Clinical Impression  Prior to admission, patient reported that she was independent with functional mobility and ADLs. She lives home alone but will have support through a "friend from church" to assist for a couple weeks following discharge. Currently she demonstrated ability to perform bed mobility and transfers without PT assistance. Pt tolerated LE exercises in supine and EOB; reviewed exercises progression and provided handout. Patient demonstrated ability to ambulate with RW and perform stairway navigation without physical assistance from PT. Pt's RLE knee flexion measured at EOB in seated position: 103 AROM. No further PT needed but continued ambulation with nursing staff will be beneficial       Assistance Recommended at Discharge Set up Supervision/Assistance  If plan is discharge home, recommend the following:  Can travel by private vehicle  A little help with walking and/or transfers;Help with stairs or ramp for entrance;Assist for transportation        Equipment Recommendations Rolling walker (2 wheels) (Youth)  Recommendations for Other Services       Functional Status Assessment Patient has had a recent decline in their functional status and demonstrates the ability to make significant improvements in function in a reasonable and predictable amount of time.     Precautions / Restrictions Precautions Precautions: Knee Precaution Booklet Issued: Yes (comment) Precaution Comments: Educated and verbalize throughout session Restrictions Weight Bearing Restrictions: No      Mobility  Bed Mobility Overal bed mobility: Independent             General bed mobility comments: Able to transition to EOB  without physical assistance from PT.    Transfers Overall transfer level: Needs assistance Equipment used: Rolling walker (2 wheels) Transfers: Sit to/from Stand Sit to Stand: Supervision           General transfer comment: Close supervision provided for safety.Performed sit to stand without use of RW. Patient able to remain upright without LOB.    Ambulation/Gait Ambulation/Gait assistance: Supervision Gait Distance (Feet): 200 Feet Assistive device: Rolling walker (2 wheels) Gait Pattern/deviations: Step-through pattern, Decreased step length - right, Decreased stride length Gait velocity: decreased Gait velocity interpretation: <1.8 ft/sec, indicate of risk for recurrent falls   General Gait Details: Presented with step through, decreased velocity and decreased step length on the Right. Verbally cued for RW proximity throghout session, with good return demonstration.  Stairs Stairs: Yes Stairs assistance: Min guard Stair Management: One rail Right, One rail Left Number of Stairs: 2 General stair comments: Educated on proper stait technique with good return demonstration. Patient ascended with LLE and descended with RLE, good control and balance noted.  Wheelchair Mobility     Tilt Bed    Modified Rankin (Stroke Patients Only)       Balance Overall balance assessment: Mild deficits observed, not formally tested                                           Pertinent Vitals/Pain Pain Assessment Pain Assessment: Faces Faces Pain Scale: Hurts a little bit Pain Location: Right knee, Surgical site Pain Descriptors / Indicators: Operative site guarding Pain Intervention(s): Limited activity within patient's tolerance, Monitored during session  Home Living Family/patient expects to be discharged to:: Private residence Living Arrangements: Alone Available Help at Discharge: Family Type of Home: Apartment Home Access: Level entry (Curb Step)        Home Layout: One level Home Equipment: Cane - single point Additional Comments: [place] friend - [place] to assist at discharge    Prior Function Prior Level of Function : Independent/Modified Independent             Mobility Comments: Use of cane for ambulation ADLs Comments: Independent.     Hand Dominance   Dominant Hand: Right    Extremity/Trunk Assessment   Upper Extremity Assessment Upper Extremity Assessment: Defer to OT evaluation    Lower Extremity Assessment Lower Extremity Assessment: Overall WFL for tasks assessed    Cervical / Trunk Assessment Cervical / Trunk Assessment: Normal  Communication   Communication: Prefers language other than [place];Interpreter utilized  Cognition Arousal/Alertness: Awake/alert Behavior During Therapy: WFL for tasks assessed/performed Overall Cognitive Status: Within Functional Limits for tasks assessed                                 General Comments: A+Ox4, Pleasant throughout session.        General Comments General comments (skin integrity, edema, etc.): Son present throughout session, translated for some of the session.    Exercises Total Joint Exercises Ankle Circles/Pumps: AROM, Strengthening, Both, 10 reps, Supine Quad Sets: AROM, Strengthening, Both, 10 reps, Supine Heel Slides: AROM, Both, 10 reps, Supine Hip ABduction/ADduction: AROM, Strengthening, Both, Other reps (comment), Supine (8) Straight Leg Raises: AROM, Both, 10 reps, Supine Long Arc Quad: AROM, Both, Other reps (comment), Seated (8 Reps) Knee Flexion: AROM, Right, AAROM, 5 reps, Seated Goniometric ROM: 103 Knee flexion; Seated   Assessment/Plan    PT Assessment Patient does not need any further PT services  PT Problem List         PT Treatment Interventions      PT Goals (Current goals can be found in the Care Plan section)  Acute Rehab PT Goals Patient Stated Goal: Patient would like to return home. PT Goal  Formulation: All assessment and education complete, DC therapy    Frequency       Co-evaluation               AM-PAC PT "6 Clicks" Mobility  Outcome Measure Help needed turning from your back to your side while in a flat bed without using bedrails?: None Help needed moving from lying on your back to sitting on the side of a flat bed without using bedrails?: None Help needed moving to and from a bed to a chair (including a wheelchair)?: None Help needed standing up from a chair using your arms (e.g., wheelchair or bedside chair)?: A Little Help needed to walk in hospital room?: A Little Help needed climbing 3-5 steps with a railing? : A Little 6 Click Score: 21    End of Session Equipment Utilized During Treatment: Gait belt Activity Tolerance: Patient tolerated treatment well Patient left: in bed;with call bell/phone within reach;with nursing/sitter in room Nurse Communication: Mobility status PT Visit Diagnosis: Other abnormalities of gait and mobility (R26.89);Difficulty in walking, not elsewhere classified (R26.2)    Time: 4098-1191 PT Time Calculation (min) (ACUTE ONLY): 31 min   Charges:   PT Evaluation $PT Eval Low Complexity: 1 Low PT Treatments $Gait Training: 8-22 mins PT General Charges $$ ACUTE PT VISIT: 1  Visit         Christene Lye, SPT Acute Rehabilitation Services [phone] Secure chat preferred    Christene Lye 06/03/2023, 1:19 PM

## 2023-06-03 NOTE — Progress Notes (Signed)
Subjective: 1 Day Post-Op Procedure(s) (LRB): RIGHT TOTAL KNEE ARTHROPLASTY (Right) Patient reports pain as mild.  Nausea/vomiting last night.  Much better today  Objective: Vital signs in last 24 hours: Temp:  [97 F (36.1 C)-98.9 F (37.2 C)] 97.9 F (36.6 C) (07/16 0731) Pulse Rate:  [52-73] 54 (07/16 0731) Resp:  [16-23] 16 (07/16 0731) BP: (122-181)/(60-85) 146/73 (07/16 0731) SpO2:  [92 %-100 %] 99 % (07/16 0731) Weight:  [68 kg] 68 kg (07/15 1014)  Intake/Output from previous day: 07/15 0701 - 07/16 0700 In: 840 [P.O.:240; I.V.:600] Out: 1620 [Urine:1600; Blood:20] Intake/Output this shift: No intake/output data recorded.  Recent Labs    06/02/23 2348  HGB 12.3   Recent Labs    06/02/23 2348  WBC 9.6  RBC 3.97  HCT 36.6  PLT 159   No results for input(s): "NA", "K", "CL", "CO2", "BUN", "CREATININE", "GLUCOSE", "CALCIUM" in the last 72 hours. No results for input(s): "LABPT", "INR" in the last 72 hours.  Neurologically intact Neurovascular intact Sensation intact distally Intact pulses distally Dorsiflexion/Plantar flexion intact Incision: dressing C/D/I No cellulitis present Compartment soft   Assessment/Plan: 1 Day Post-Op Procedure(s) (LRB): RIGHT TOTAL KNEE ARTHROPLASTY (Right) Advance diet Up with therapy D/C IV fluids Discharge home with home health once cleared by PT and has been able to urinate WBAT RLE      Cristie Hem 06/03/2023, 8:01 AM

## 2023-06-03 NOTE — Progress Notes (Signed)
Patient alert and oriented, mae's well, voiding adequate amount of urine, swallowing without difficulty, no c/o pain at time of discharge. Patient discharged home with family. Script and discharged instructions given to patient. Patient and family stated understanding of instructions given. Patient has an appointment with Dr. Xu 

## 2023-06-03 NOTE — Anesthesia Postprocedure Evaluation (Signed)
Anesthesia Post Note  Patient: Elizabeth Jennings  Procedure(s) Performed: RIGHT TOTAL KNEE ARTHROPLASTY (Right: Knee)     Patient location during evaluation: PACU Anesthesia Type: Spinal Level of consciousness: awake and alert Pain management: pain level controlled Vital Signs Assessment: post-procedure vital signs reviewed and stable Respiratory status: spontaneous breathing Cardiovascular status: stable Postop Assessment: spinal receding Anesthetic complications: no   No notable events documented.  Last Vitals:  Vitals:   06/03/23 0540 06/03/23 0731  BP: (!) 144/63 (!) 146/73  Pulse: (!) 58 (!) 54  Resp: 18 16  Temp: 36.6 C 36.6 C  SpO2: 99% 99%    Last Pain:  Vitals:   06/03/23 0731  TempSrc: Oral  PainSc:                  Lewie Loron

## 2023-06-03 NOTE — Discharge Summary (Signed)
Patient ID: Elizabeth Jennings MRN: [id number] DOB/AGE: 76-Mar-1947 76 y.o.  Admit date: 06/02/2023 Discharge date: 06/03/2023  Admission Diagnoses:  Principal Problem:   Primary osteoarthritis of right knee Active Problems:   Status post total right knee replacement   Discharge Diagnoses:  Same  Past Medical History:  Diagnosis Date   Bell's palsy    Hemorrhoids    Hyperlipidemia    Hypertension    Osteoarthritis    right knee   Osteoporosis    Prediabetes     Surgeries: Procedure(s): RIGHT TOTAL KNEE ARTHROPLASTY on 06/02/2023   Consultants:   Discharged Condition: Improved  Hospital Course: Elizabeth Jennings is an 77 y.o. female who was admitted 06/02/2023 for operative treatment ofPrimary osteoarthritis of right knee. Patient has severe unremitting pain that affects sleep, daily activities, and work/hobbies. After pre-op clearance the patient was taken to the operating room on 06/02/2023 and underwent  Procedure(s): RIGHT TOTAL KNEE ARTHROPLASTY.    Patient was given perioperative antibiotics:  Anti-infectives (From admission, onward)    Start     Dose/Rate Route Frequency Ordered Stop   06/02/23 1600  ceFAZolin (ANCEF) IVPB 2g/100 mL premix        2 g 200 mL/hr over 30 Minutes Intravenous Every 6 hours 06/02/23 1550 06/02/23 2254   06/02/23 1450  vancomycin (VANCOCIN) powder  Status:  Discontinued          As needed 06/02/23 1450 06/02/23 1502   06/02/23 1030  ceFAZolin (ANCEF) IVPB 2g/100 mL premix        2 g 200 mL/hr over 30 Minutes Intravenous On call to O.R. 06/02/23 1015 06/02/23 1329        Patient was given sequential compression devices, early ambulation, and chemoprophylaxis to prevent DVT.  Patient benefited maximally from hospital stay and there were no complications.    Recent vital signs: Patient Vitals for the past 24 hrs:  BP Temp Temp src Pulse Resp SpO2 Height Weight  06/03/23 0731 (!) 146/73 97.9 F (36.6 C) Oral (!) 54 16 99 % -- --  06/03/23  0540 (!) 144/63 97.8 F (36.6 C) Oral (!) 58 18 99 % -- --  06/02/23 2248 138/60 (!) 97.5 F (36.4 C) Oral (!) 58 18 98 % -- --  06/02/23 2048 132/76 97.8 F (36.6 C) Oral (!) 58 18 96 % -- --  06/02/23 1629 (!) 181/85 98.9 F (37.2 C) -- (!) 52 20 100 % -- --  06/02/23 1545 (!) 167/83 (!) 97 F (36.1 C) -- (!) 55 19 97 % -- --  06/02/23 1530 (!) 162/80 -- -- (!) 55 16 99 % -- --  06/02/23 1515 (!) 150/76 -- -- (!) 56 (!) 21 94 % -- --  06/02/23 1500 138/71 (!) 97.3 F (36.3 C) -- (!) 56 18 93 % -- --  06/02/23 1155 122/67 -- -- (!) 57 (!) 23 92 % -- --  06/02/23 1151 (!) 144/71 -- -- (!) 58 (!) 21 97 % -- --  06/02/23 1014 (!) 146/78 97.9 F (36.6 C) Oral 73 18 96 % 4\' 10"  (1.473 m) 68 kg     Recent laboratory studies:  Recent Labs    06/02/23 2348  WBC 9.6  HGB 12.3  HCT 36.6  PLT 159     Discharge Medications:   Allergies as of 06/03/2023   No Known Allergies      Medication List     STOP taking these medications    acetaminophen 500 MG tablet Commonly  known as: TYLENOL   azelastine 0.1 % nasal spray Commonly known as: ASTELIN   meloxicam 7.5 MG tablet Commonly known as: MOBIC   traMADol 50 MG tablet Commonly known as: ULTRAM       TAKE these medications    ascorbic acid 500 MG tablet Commonly known as: VITAMIN C Take 500 mg by mouth daily.   aspirin EC 81 MG tablet Take 1 tablet (81 mg total) by mouth 2 (two) times daily. To be taken after surgery to prevent  blood clots What changed: Another medication with the same name was removed. Continue taking this medication, and follow the directions you see here.   carboxymethylcellulose 0.5 % Soln Commonly known as: REFRESH PLUS 1 drop 3 (three) times daily as needed.   cetirizine 10 MG tablet Commonly known as: ZyrTEC Allergy Take 1 tablet (10 mg total) by mouth daily.   docusate sodium 100 MG capsule Commonly known as: Colace Take 1 capsule (100 mg total) by mouth daily as needed.    fluticasone 50 MCG/ACT nasal spray Commonly known as: FLONASE Place 2 sprays into both nostrils daily.   losartan 50 MG tablet Commonly known as: COZAAR TAKE 1 TABLET(50 MG) BY MOUTH DAILY   methocarbamol 750 MG tablet Commonly known as: Robaxin-750 Take 1 tablet (750 mg total) by mouth 2 (two) times daily as needed for muscle spasms. What changed: Another medication with the same name was removed. Continue taking this medication, and follow the directions you see here.   multivitamin capsule Take 1 capsule by mouth daily.   ondansetron 4 MG tablet Commonly known as: Zofran Take 1 tablet (4 mg total) by mouth every 8 (eight) hours as needed for nausea or vomiting.   oxyCODONE-acetaminophen 5-325 MG tablet Commonly known as: Percocet Take 1-2 tablets by mouth every 6 (six) hours as needed. To be taken after surgery   Vitamin D (Ergocalciferol) 1.25 MG (50000 UNIT) Caps capsule Commonly known as: DRISDOL TAKE 1 CAPSULE BY MOUTH EVERY 7 DAYS               Durable Medical Equipment  (From admission, onward)           Start     Ordered   06/02/23 1634  DME Walker rolling  Once       Question Answer Comment  Walker: With 5 Inch Wheels   Patient needs a walker to treat with the following condition Status post left partial knee replacement      06/02/23 1633   06/02/23 1634  DME 3 n 1  Once        06/02/23 1633   06/02/23 1634  DME Bedside commode  Once       Question:  Patient needs a bedside commode to treat with the following condition  Answer:  Status post left partial knee replacement   06/02/23 1633            Diagnostic Studies: DG Knee Right Port  Result Date: 06/02/2023 CLINICAL DATA:  Status post right knee arthroplasty. EXAM: PORTABLE RIGHT KNEE - 1-2 VIEW COMPARISON:  Apr 11, 2023. FINDINGS: Tibial and femoral components are well situated. Expected postoperative changes are noted in the soft tissues anteriorly. IMPRESSION: Status post right total  knee arthroplasty. Electronically Signed   By: Lupita Raider M.D.   On: 06/02/2023 16:42    Disposition: Discharge disposition: 01-Home or Self Care          Follow-up Information  Cristie Hem, PA-C. Schedule an appointment as soon as possible for a visit in 2 week(s).   Specialty: Orthopedic Surgery Contact information: [address] [place] [address] [phone]         [facility] Follow up.   Why: [facility]-the home health agency will contact you for the first home visit. Contact information: [address] [place] [address] [phone]                  Signed: Cristie Hem 06/03/2023, 8:02 AM

## 2023-06-03 NOTE — Evaluation (Signed)
Occupational Therapy Evaluation Patient Details Name: Elizabeth Jennings MRN: [id number] DOB: [birth year] Today's Date: 06/03/2023   History of Present Illness 77 y.o. female presenting s/p R TKA (07/15). PMH significant for: Bell's Palsy, OA, prediabetes HLD, HTN, Osteoporosis.bells palsy, eye lid palsy   Clinical Impression   Patient is s/p R TKA surgery resulting in functional limitations due to the deficits listed below (see OT problem list). Pt at baseline lives alone in apartment and completes IADLS indep. Pt will have (A) of family and friends upon dc home.  Patient will benefit from skilled OT acutely to increase independence and safety with ADLS to allow discharge HHOT.       Recommendations for follow up therapy are one component of a multi-disciplinary discharge planning process, led by the attending physician.  Recommendations may be updated based on patient status, additional functional criteria and insurance authorization.   Assistance Recommended at Discharge PRN  Patient can return home with the following A little help with walking and/or transfers;A little help with bathing/dressing/bathroom    Functional Status Assessment  Patient has had a recent decline in their functional status and demonstrates the ability to make significant improvements in function in a reasonable and predictable amount of time.  Equipment Recommendations  Other (comment) (youth RW)    Recommendations for Other Services       Precautions / Restrictions Precautions Precautions: Knee Precaution Comments: educated on knee extension Restrictions Weight Bearing Restrictions: No      Mobility Bed Mobility Overal bed mobility: Independent             General bed mobility comments: able to transition with cues for LLE hooking RLE on initial attempt. pt demonstrates x3 without need to hook R LE    Transfers Overall transfer level: Needs assistance Equipment used: Rolling walker (2  wheels) Transfers: Sit to/from Stand Sit to Stand: Supervision           General transfer comment: cues for hand placement and placement in the RW      Balance Overall balance assessment: Mild deficits observed, not formally tested                                         ADL either performed or assessed with clinical judgement   ADL Overall ADL's : Needs assistance/impaired Eating/Feeding: Modified independent   Grooming: Wash/dry hands;Supervision/safety;Standing           Upper Body Dressing : Modified independent;Standing   Lower Body Dressing: Modified independent;Sit to/from stand   Toilet Transfer: Supervision/safety;Ambulation;Regular Toilet;Rolling walker (2 wheels)   Toileting- Clothing Manipulation and Hygiene: Supervision/safety       Functional mobility during ADLs: Supervision/safety;Rolling walker (2 wheels)       Vision Baseline Vision/History: 1 Wears glasses Ability to See in Adequate Light: 0 Adequate Patient Visual Report: No change from baseline Vision Assessment?: No apparent visual deficits     Perception     Praxis      Pertinent Vitals/Pain Pain Assessment Pain Assessment: Faces Faces Pain Scale: Hurts a little bit Pain Location: Right knee, Surgical site Pain Descriptors / Indicators: Operative site guarding Pain Intervention(s): Repositioned, Premedicated before session     Hand Dominance Right   Extremity/Trunk Assessment Upper Extremity Assessment Upper Extremity Assessment: Overall WFL for tasks assessed   Lower Extremity Assessment Lower Extremity Assessment: Defer to PT evaluation   Cervical / Trunk Assessment  Cervical / Trunk Assessment: Normal   Communication Communication Communication: Prefers language other than English;Interpreter utilized   Cognition Arousal/Alertness: Awake/alert Behavior During Therapy: WFL for tasks assessed/performed Overall Cognitive Status: Within Functional  Limits for tasks assessed                                 General Comments: A+Ox4, Pleasant throughout session.     General Comments  son present to translate    Exercises Exercises: Total Joint Total Joint Exercises Quad Sets: AROM, Strengthening, Both, 10 reps, Supine   Shoulder Instructions      Home Living Family/patient expects to be discharged to:: Private residence Living Arrangements: Alone Available Help at Discharge: Family Type of Home: Apartment Home Access: Level entry (Curb Step)     Home Layout: One level     Bathroom Shower/Tub: Chief Strategy Officer: Standard     Home Equipment: Cane - single point;Grab bars - tub/shower;Hand held shower head   Additional Comments: [place] friend - [place] to assist at discharge      Prior Functioning/Environment Prior Level of Function : Independent/Modified Independent             Mobility Comments: Use of cane for ambulation ADLs Comments: Independent. cleans house, does not drive and family / friends help get groceries        OT Problem List: Decreased knowledge of precautions;Decreased safety awareness      OT Treatment/Interventions:      OT Goals(Current goals can be found in the care plan section) Acute Rehab OT Goals Potential to Achieve Goals: Good  OT Frequency:      Co-evaluation              AM-PAC OT "6 Clicks" Daily Activity     Outcome Measure Help from another person eating meals?: None Help from another person taking care of personal grooming?: None Help from another person toileting, which includes using toliet, bedpan, or urinal?: None Help from another person bathing (including washing, rinsing, drying)?: None Help from another person to put on and taking off regular upper body clothing?: None Help from another person to put on and taking off regular lower body clothing?: None 6 Click Score: 24   End of Session Equipment Utilized During Treatment:  Rolling walker (2 wheels) Nurse Communication: Mobility status  Activity Tolerance: Patient tolerated treatment well Patient left: in bed;with call bell/phone within reach;with family/visitor present  OT Visit Diagnosis: Unsteadiness on feet (R26.81)                Time: 3151-7616 OT Time Calculation (min): 26 min Charges:  OT General Charges $OT Visit: 1 Visit OT Evaluation $OT Eval Moderate Complexity: 1 Mod   Brynn, OTR/L  Acute Rehabilitation Services Office: [phone] .   Mateo Flow 06/03/2023, 4:24 PM

## 2023-06-04 ENCOUNTER — Other Ambulatory Visit: Payer: Self-pay | Admitting: Physician Assistant

## 2023-06-04 ENCOUNTER — Telehealth: Payer: Self-pay

## 2023-06-04 DIAGNOSIS — Z471 Aftercare following joint replacement surgery: Secondary | ICD-10-CM | POA: Diagnosis not present

## 2023-06-04 DIAGNOSIS — M810 Age-related osteoporosis without current pathological fracture: Secondary | ICD-10-CM | POA: Diagnosis not present

## 2023-06-04 DIAGNOSIS — I10 Essential (primary) hypertension: Secondary | ICD-10-CM | POA: Diagnosis not present

## 2023-06-04 DIAGNOSIS — E559 Vitamin D deficiency, unspecified: Secondary | ICD-10-CM | POA: Diagnosis not present

## 2023-06-04 DIAGNOSIS — Z96651 Presence of right artificial knee joint: Secondary | ICD-10-CM | POA: Diagnosis not present

## 2023-06-04 DIAGNOSIS — Z7982 Long term (current) use of aspirin: Secondary | ICD-10-CM | POA: Diagnosis not present

## 2023-06-04 DIAGNOSIS — Z6827 Body mass index (BMI) 27.0-27.9, adult: Secondary | ICD-10-CM | POA: Diagnosis not present

## 2023-06-04 DIAGNOSIS — E785 Hyperlipidemia, unspecified: Secondary | ICD-10-CM | POA: Diagnosis not present

## 2023-06-04 DIAGNOSIS — E669 Obesity, unspecified: Secondary | ICD-10-CM | POA: Diagnosis not present

## 2023-06-04 NOTE — Telephone Encounter (Signed)
Called pharmacy. Medications are ready for pick up. Notified patient.

## 2023-06-04 NOTE — Telephone Encounter (Signed)
Yes.  Can we check on the percocet?

## 2023-06-04 NOTE — Telephone Encounter (Signed)
Elizabeth Jennings with [facility] called stating that patient's pain is  a 10/10 and wanted to know if patient could take Tylenol until she got her Rx for Oxycodone.  Patient had right knee surgery on 06/02/2023.  Cb# [phone].  Please advise.

## 2023-06-05 ENCOUNTER — Telehealth: Payer: Self-pay | Admitting: Orthopaedic Surgery

## 2023-06-05 DIAGNOSIS — I10 Essential (primary) hypertension: Secondary | ICD-10-CM | POA: Diagnosis not present

## 2023-06-05 DIAGNOSIS — Z96651 Presence of right artificial knee joint: Secondary | ICD-10-CM | POA: Diagnosis not present

## 2023-06-05 DIAGNOSIS — E785 Hyperlipidemia, unspecified: Secondary | ICD-10-CM | POA: Diagnosis not present

## 2023-06-05 DIAGNOSIS — M810 Age-related osteoporosis without current pathological fracture: Secondary | ICD-10-CM | POA: Diagnosis not present

## 2023-06-05 DIAGNOSIS — E559 Vitamin D deficiency, unspecified: Secondary | ICD-10-CM | POA: Diagnosis not present

## 2023-06-05 DIAGNOSIS — Z471 Aftercare following joint replacement surgery: Secondary | ICD-10-CM | POA: Diagnosis not present

## 2023-06-05 NOTE — Telephone Encounter (Signed)
Yes

## 2023-06-05 NOTE — Telephone Encounter (Signed)
[facility] called requesting verbal orders for OT 2 week 2 also requesting bathing instructions, please advise call [phone] Will Secure okay to leave VM

## 2023-06-06 DIAGNOSIS — M810 Age-related osteoporosis without current pathological fracture: Secondary | ICD-10-CM | POA: Diagnosis not present

## 2023-06-06 DIAGNOSIS — E785 Hyperlipidemia, unspecified: Secondary | ICD-10-CM | POA: Diagnosis not present

## 2023-06-06 DIAGNOSIS — Z471 Aftercare following joint replacement surgery: Secondary | ICD-10-CM | POA: Diagnosis not present

## 2023-06-06 DIAGNOSIS — Z96651 Presence of right artificial knee joint: Secondary | ICD-10-CM | POA: Diagnosis not present

## 2023-06-06 DIAGNOSIS — I10 Essential (primary) hypertension: Secondary | ICD-10-CM | POA: Diagnosis not present

## 2023-06-06 DIAGNOSIS — E559 Vitamin D deficiency, unspecified: Secondary | ICD-10-CM | POA: Diagnosis not present

## 2023-06-06 NOTE — Telephone Encounter (Signed)
I left voicemail with verbal orders.

## 2023-06-09 DIAGNOSIS — M810 Age-related osteoporosis without current pathological fracture: Secondary | ICD-10-CM | POA: Diagnosis not present

## 2023-06-09 DIAGNOSIS — E559 Vitamin D deficiency, unspecified: Secondary | ICD-10-CM | POA: Diagnosis not present

## 2023-06-09 DIAGNOSIS — E785 Hyperlipidemia, unspecified: Secondary | ICD-10-CM | POA: Diagnosis not present

## 2023-06-09 DIAGNOSIS — I10 Essential (primary) hypertension: Secondary | ICD-10-CM | POA: Diagnosis not present

## 2023-06-09 DIAGNOSIS — Z471 Aftercare following joint replacement surgery: Secondary | ICD-10-CM | POA: Diagnosis not present

## 2023-06-09 DIAGNOSIS — Z96651 Presence of right artificial knee joint: Secondary | ICD-10-CM | POA: Diagnosis not present

## 2023-06-10 ENCOUNTER — Telehealth: Payer: Self-pay | Admitting: Radiology

## 2023-06-10 ENCOUNTER — Other Ambulatory Visit: Payer: Self-pay

## 2023-06-10 ENCOUNTER — Encounter (HOSPITAL_COMMUNITY): Payer: Self-pay

## 2023-06-10 ENCOUNTER — Emergency Department (HOSPITAL_COMMUNITY)
Admission: EM | Admit: 2023-06-10 | Discharge: 2023-06-10 | Disposition: A | Discharge status: Home / Self Care | Payer: Medicare Other | Attending: Emergency Medicine | Admitting: Emergency Medicine

## 2023-06-10 DIAGNOSIS — S7011XA Contusion of right thigh, initial encounter: Secondary | ICD-10-CM | POA: Diagnosis not present

## 2023-06-10 DIAGNOSIS — E785 Hyperlipidemia, unspecified: Secondary | ICD-10-CM | POA: Diagnosis not present

## 2023-06-10 DIAGNOSIS — M810 Age-related osteoporosis without current pathological fracture: Secondary | ICD-10-CM | POA: Diagnosis not present

## 2023-06-10 DIAGNOSIS — S8991XA Unspecified injury of right lower leg, initial encounter: Secondary | ICD-10-CM | POA: Diagnosis present

## 2023-06-10 DIAGNOSIS — G8918 Other acute postprocedural pain: Secondary | ICD-10-CM | POA: Insufficient documentation

## 2023-06-10 DIAGNOSIS — E559 Vitamin D deficiency, unspecified: Secondary | ICD-10-CM | POA: Diagnosis not present

## 2023-06-10 DIAGNOSIS — X58XXXA Exposure to other specified factors, initial encounter: Secondary | ICD-10-CM | POA: Diagnosis not present

## 2023-06-10 DIAGNOSIS — R609 Edema, unspecified: Secondary | ICD-10-CM | POA: Diagnosis not present

## 2023-06-10 DIAGNOSIS — M25561 Pain in right knee: Secondary | ICD-10-CM | POA: Insufficient documentation

## 2023-06-10 DIAGNOSIS — I10 Essential (primary) hypertension: Secondary | ICD-10-CM | POA: Diagnosis not present

## 2023-06-10 DIAGNOSIS — Z7982 Long term (current) use of aspirin: Secondary | ICD-10-CM | POA: Diagnosis not present

## 2023-06-10 DIAGNOSIS — Z471 Aftercare following joint replacement surgery: Secondary | ICD-10-CM | POA: Diagnosis not present

## 2023-06-10 DIAGNOSIS — Z96651 Presence of right artificial knee joint: Secondary | ICD-10-CM | POA: Diagnosis not present

## 2023-06-10 LAB — CBC WITH DIFFERENTIAL/PLATELET
Abs Immature Granulocytes: 0.15 10*3/uL — ABNORMAL HIGH (ref 0.00–0.07)
Basophils Absolute: 0 10*3/uL (ref 0.0–0.1)
Basophils Relative: 0 %
Eosinophils Absolute: 0.2 10*3/uL (ref 0.0–0.5)
Eosinophils Relative: 2 %
HCT: 27.5 % — ABNORMAL LOW (ref 36.0–46.0)
Hemoglobin: 9 g/dL — ABNORMAL LOW (ref 12.0–15.0)
Immature Granulocytes: 2 %
Lymphocytes Relative: 23 %
Lymphs Abs: 1.9 10*3/uL (ref 0.7–4.0)
MCH: 31.8 pg (ref 26.0–34.0)
MCHC: 32.7 g/dL (ref 30.0–36.0)
MCV: 97.2 fL (ref 80.0–100.0)
Monocytes Absolute: 1 10*3/uL (ref 0.1–1.0)
Monocytes Relative: 11 %
Neutro Abs: 5.3 10*3/uL (ref 1.7–7.7)
Neutrophils Relative %: 62 %
Platelets: 245 10*3/uL (ref 150–400)
RBC: 2.83 MIL/uL — ABNORMAL LOW (ref 3.87–5.11)
RDW: 14.9 % (ref 11.5–15.5)
WBC: 8.5 10*3/uL (ref 4.0–10.5)
nRBC: 0.5 % — ABNORMAL HIGH (ref 0.0–0.2)

## 2023-06-10 LAB — BASIC METABOLIC PANEL
Anion gap: 12 (ref 5–15)
BUN: 15 mg/dL (ref 8–23)
CO2: 24 mmol/L (ref 22–32)
Calcium: 8.9 mg/dL (ref 8.9–10.3)
Chloride: 101 mmol/L (ref 98–111)
Creatinine, Ser: 0.52 mg/dL (ref 0.44–1.00)
GFR, Estimated: >60 mL/min (ref >60)
Glucose, Bld: 104 mg/dL — ABNORMAL HIGH (ref 70–99)
Potassium: 3.6 mmol/L (ref 3.5–5.1)
Sodium: 137 mmol/L (ref 135–145)

## 2023-06-10 LAB — PROTIME-INR
INR: 1 (ref 0.8–1.2)
Prothrombin Time: 13.8 seconds (ref 11.4–15.2)

## 2023-06-10 MED ORDER — HYDROCODONE-ACETAMINOPHEN 5-325 MG PO TABS
1.0000 | ORAL_TABLET | Freq: Once | ORAL | Status: AC
  Administered 2023-06-10: 1 via ORAL
  Filled 2023-06-10: qty 1

## 2023-06-10 NOTE — ED Triage Notes (Signed)
Total knee replacement 7/15, significant increase in pain and bruising on 7/18. Good PMS, knee hot to touch. Auqacel still in place from surgery

## 2023-06-10 NOTE — Telephone Encounter (Signed)
Received voicemail from [place], [place]. Patient's sister states bruise came up a few days post op and seems to be getting bigger. Per Alcario Drought, it is not warm to the touch or red.  Wanted for you to be aware. Please call Alcario Drought at [phone] to advise if something needs to be done.

## 2023-06-10 NOTE — Discharge Instructions (Addendum)
Orthopedics will see you in clinic tomorrow. Please follow-up with orthopedics in clinic tomorrow for a repeat assessment.  Continue to take your pain medication.

## 2023-06-10 NOTE — ED Provider Notes (Signed)
[place] EMERGENCY DEPARTMENT AT [facility] Provider Note   CSN: [id number] Arrival date & time: 06/10/23  1849     History  Chief Complaint  Patient presents with   Knee Pain    Elizabeth Jennings is a 77 y.o. female.   Knee Pain    77 year old female who presents to the emergency department with pain and swelling of the right knee after total knee replacement.  The patient had total knee replacement with Dr. Roda Shutters 7/15.  She noticed an increase in pain in the knee with warmth as of 7/18.  She endorses worsening pain and swelling over the last 24 hours.  She also states that she has a new hematoma that formed on the medial aspect of her right thigh.  She is not on anticoagulation.  She has been taking aspirin.  Has been taking her home pain medicine.  She presents for evaluation due to worsening pain.  Home Medications Prior to Admission medications   Medication Sig Start Date End Date Taking? Authorizing Provider  ascorbic acid (VITAMIN C) 500 MG tablet Take 500 mg by mouth daily.    [provider]  aspirin EC 81 MG tablet Take 1 tablet (81 mg total) by mouth 2 (two) times daily. To be taken after surgery to prevent  blood clots 05/26/23 05/25/24  Cristie Hem, PA-C  carboxymethylcellulose (REFRESH PLUS) 0.5 % SOLN 1 drop 3 (three) times daily as needed.    [provider]  cetirizine (ZYRTEC ALLERGY) 10 MG tablet Take 1 tablet (10 mg total) by mouth daily. 02/28/22 03/25/23  Ivonne Andrew, NP  docusate sodium (COLACE) 100 MG capsule Take 1 capsule (100 mg total) by mouth daily as needed. 05/26/23 05/25/24  Cristie Hem, PA-C  fluticasone (FLONASE) 50 MCG/ACT nasal spray Place 2 sprays into both nostrils daily. 10/08/22   Massie Maroon, FNP  losartan (COZAAR) 50 MG tablet TAKE 1 TABLET(50 MG) BY MOUTH DAILY 03/25/23   Massie Maroon, FNP  methocarbamol (ROBAXIN-750) 750 MG tablet Take 1 tablet (750 mg total) by mouth 2 (two) times daily as needed for  muscle spasms. 05/26/23   Cristie Hem, PA-C  Multiple Vitamin (MULTIVITAMIN) capsule Take 1 capsule by mouth daily.    [provider]  ondansetron (ZOFRAN) 4 MG tablet Take 1 tablet (4 mg total) by mouth every 8 (eight) hours as needed for nausea or vomiting. 05/26/23   Cristie Hem, PA-C  oxyCODONE-acetaminophen (PERCOCET) 5-325 MG tablet Take 1-2 tablets by mouth every 6 (six) hours as needed. To be taken after surgery 05/26/23   Cristie Hem, PA-C  Vitamin D, Ergocalciferol, (DRISDOL) 1.25 MG (50000 UNIT) CAPS capsule TAKE 1 CAPSULE BY MOUTH EVERY 7 DAYS 05/19/23   Massie Maroon, FNP      Allergies    Patient has no known allergies.    Review of Systems   Review of Systems  All other systems reviewed and are negative.   Physical Exam Updated Vital Signs BP (!) 141/66 (BP Location: Right Arm)   Pulse 79   Temp 98.9 F (37.2 C) (Oral)   Resp 16   Ht 4\' 10"  (1.473 m)   Wt 68 kg   SpO2 97%   BMI 31.33 kg/m  Physical Exam Vitals and nursing note reviewed.  Constitutional:      General: She is not in acute distress. HENT:     Head: Normocephalic and atraumatic.  Eyes:     Conjunctiva/sclera:  Conjunctivae normal.     Pupils: Pupils are equal, round, and reactive to light.  Cardiovascular:     Rate and Rhythm: Normal rate and regular rhythm.  Pulmonary:     Effort: Pulmonary effort is normal. No respiratory distress.  Abdominal:     General: There is no distension.     Tenderness: There is no guarding.  Musculoskeletal:        General: No deformity or signs of injury.     Cervical back: Neck supple.     Comments: Aquacel postoperative dressing in place, no significant oozing, mild warmth palpated, swelling about the knee joint, hematoma about the medial aspect of the right thigh.  Distally neurovascularly intact.  No significant pitting edema in the lower extremities  Skin:    Findings: No lesion or rash.  Neurological:     General: No focal deficit  present.     Mental Status: She is alert. Mental status is at baseline.      ED Results / Procedures / Treatments   Labs (all labs ordered are listed, but only abnormal results are displayed) Labs Reviewed  CBC WITH DIFFERENTIAL/PLATELET - Abnormal; Notable for the following components:      Result Value   RBC 2.83 (*)    Hemoglobin 9.0 (*)    HCT 27.5 (*)    nRBC 0.5 (*)    Abs Immature Granulocytes 0.15 (*)    All other components within normal limits  BASIC METABOLIC PANEL - Abnormal; Notable for the following components:   Glucose, Bld 104 (*)    All other components within normal limits  PROTIME-INR    EKG None  Radiology No results found.  Procedures Procedures    Medications Ordered in ED Medications  HYDROcodone-acetaminophen (NORCO/VICODIN) 5-325 MG per tablet 1 tablet (1 tablet Oral Given 06/10/23 1928)    ED Course/ Medical Decision Making/ A&P                             Medical Decision Making Amount and/or Complexity of Data Reviewed Labs: ordered.  Risk Prescription drug management.    78 year old female who presents to the emergency department with pain and swelling of the right knee after total knee replacement.  The patient had total knee replacement with Dr. Roda Shutters 7/15.  She noticed an increase in pain in the knee with warmth as of 7/18.  She endorses worsening pain and swelling over the last 24 hours.  She also states that she has a new hematoma that formed on the medial aspect of her right thigh.  She is not on anticoagulation.  She has been taking aspirin.  Has been taking her home pain medicine.  She presents for evaluation due to worsening pain.  Rival, the patient was afebrile, vitally stable.  I spoke with Dr. Ophelia Charter of on-call orthopedics regarding the patient.  He recommended the patient follow-up outpatient in clinic.  Lower concern for all of DVT at this time as the patient has no pitting edema in the lower extremity.  She presents with  likely bleeding in her knee postoperatively and a hematoma which is causing her increasing pain.  She has been doing PT at home.  Dr. Ophelia Charter recommended that the patient follow-up emergently in clinic tomorrow for repeat assessment with orthopedics.  Laboratory evaluation revealed CBC without a leukocytosis, hemoglobin anemic to 9.0 dropped from 3 points postoperatively which is likely due to blood loss postoperatively.  BMP  generally unremarkable, INR normal.  The patient was administered Norco for pain control.  Recommended close follow-up in clinic with orthopedics tomorrow.  Final Clinical Impression(s) / ED Diagnoses Final diagnoses:  Acute pain of right knee  Acute post-operative pain  Hematoma of right thigh, initial encounter    Rx / DC Orders ED Discharge Orders     None         Ernie Avena, MD 06/10/23 2026

## 2023-06-11 DIAGNOSIS — Z471 Aftercare following joint replacement surgery: Secondary | ICD-10-CM | POA: Diagnosis not present

## 2023-06-11 DIAGNOSIS — M810 Age-related osteoporosis without current pathological fracture: Secondary | ICD-10-CM | POA: Diagnosis not present

## 2023-06-11 DIAGNOSIS — Z96651 Presence of right artificial knee joint: Secondary | ICD-10-CM | POA: Diagnosis not present

## 2023-06-11 DIAGNOSIS — E559 Vitamin D deficiency, unspecified: Secondary | ICD-10-CM | POA: Diagnosis not present

## 2023-06-11 DIAGNOSIS — E785 Hyperlipidemia, unspecified: Secondary | ICD-10-CM | POA: Diagnosis not present

## 2023-06-11 DIAGNOSIS — I10 Essential (primary) hypertension: Secondary | ICD-10-CM | POA: Diagnosis not present

## 2023-06-11 NOTE — Telephone Encounter (Signed)
Ok, thanks.

## 2023-06-12 ENCOUNTER — Other Ambulatory Visit: Payer: Self-pay | Admitting: Physician Assistant

## 2023-06-12 ENCOUNTER — Telehealth: Payer: Self-pay | Admitting: Physician Assistant

## 2023-06-12 DIAGNOSIS — E785 Hyperlipidemia, unspecified: Secondary | ICD-10-CM | POA: Diagnosis not present

## 2023-06-12 DIAGNOSIS — I10 Essential (primary) hypertension: Secondary | ICD-10-CM | POA: Diagnosis not present

## 2023-06-12 DIAGNOSIS — Z96651 Presence of right artificial knee joint: Secondary | ICD-10-CM | POA: Diagnosis not present

## 2023-06-12 DIAGNOSIS — Z471 Aftercare following joint replacement surgery: Secondary | ICD-10-CM | POA: Diagnosis not present

## 2023-06-12 DIAGNOSIS — E559 Vitamin D deficiency, unspecified: Secondary | ICD-10-CM | POA: Diagnosis not present

## 2023-06-12 DIAGNOSIS — M810 Age-related osteoporosis without current pathological fracture: Secondary | ICD-10-CM | POA: Diagnosis not present

## 2023-06-12 MED ORDER — OXYCODONE-ACETAMINOPHEN 5-325 MG PO TABS: 1.0000 | ORAL_TABLET | Freq: Four times a day (QID) | ORAL | 0 refills | Status: DC | PRN

## 2023-06-12 NOTE — Telephone Encounter (Signed)
Patient's caregiver Dereck Leep called advised patient need Rx refilled Oxycodone. The number to contact Annitta Needs is [phone]

## 2023-06-12 NOTE — Telephone Encounter (Signed)
sent 

## 2023-06-12 NOTE — Telephone Encounter (Signed)
Notified patient.

## 2023-06-16 ENCOUNTER — Telehealth: Payer: Self-pay

## 2023-06-16 DIAGNOSIS — E559 Vitamin D deficiency, unspecified: Secondary | ICD-10-CM | POA: Diagnosis not present

## 2023-06-16 DIAGNOSIS — I10 Essential (primary) hypertension: Secondary | ICD-10-CM | POA: Diagnosis not present

## 2023-06-16 DIAGNOSIS — M810 Age-related osteoporosis without current pathological fracture: Secondary | ICD-10-CM | POA: Diagnosis not present

## 2023-06-16 DIAGNOSIS — Z471 Aftercare following joint replacement surgery: Secondary | ICD-10-CM | POA: Diagnosis not present

## 2023-06-16 DIAGNOSIS — E785 Hyperlipidemia, unspecified: Secondary | ICD-10-CM | POA: Diagnosis not present

## 2023-06-16 DIAGNOSIS — Z96651 Presence of right artificial knee joint: Secondary | ICD-10-CM | POA: Diagnosis not present

## 2023-06-16 NOTE — Telephone Encounter (Signed)
Transition Care Management Follow-up Telephone Call Date of discharge and from where: [facility] 7/23 How have you been since you were released from the hospital? Doing ok Any questions or concerns? No  Items Reviewed: Did the pt receive and understand the discharge instructions provided? Yes  Medications obtained and verified? Yes  Other? No  Any new allergies since your discharge? No  Dietary orders reviewed? NO Do you have support at home? Yes    Follow up appointments reviewed:  PCP Hospital f/u appt confirmed? Yes  Scheduled to see PCP on the phone  @ . [facility] f/u appt confirmed? Yes  Scheduled to see Ortho on 7/30 @ 1:15. Are transportation arrangements needed? Yes  If their condition worsens, is the pt aware to call PCP or go to the Emergency Dept.? Yes Was the patient provided with contact information for the PCP's office or ED? Yes Was to pt encouraged to call back with questions or concerns? Yes    Lenard Forth [facility] Guide, [facility] [phone] [address] [place], [place], [place] [address] Phone: [phone] Email: [email or web address]

## 2023-06-17 ENCOUNTER — Ambulatory Visit (INDEPENDENT_AMBULATORY_CARE_PROVIDER_SITE_OTHER): Payer: Medicare Other | Admitting: Physician Assistant

## 2023-06-17 DIAGNOSIS — E559 Vitamin D deficiency, unspecified: Secondary | ICD-10-CM | POA: Diagnosis not present

## 2023-06-17 DIAGNOSIS — M810 Age-related osteoporosis without current pathological fracture: Secondary | ICD-10-CM | POA: Diagnosis not present

## 2023-06-17 DIAGNOSIS — I10 Essential (primary) hypertension: Secondary | ICD-10-CM | POA: Diagnosis not present

## 2023-06-17 DIAGNOSIS — E785 Hyperlipidemia, unspecified: Secondary | ICD-10-CM | POA: Diagnosis not present

## 2023-06-17 DIAGNOSIS — Z471 Aftercare following joint replacement surgery: Secondary | ICD-10-CM | POA: Diagnosis not present

## 2023-06-17 DIAGNOSIS — Z96651 Presence of right artificial knee joint: Secondary | ICD-10-CM

## 2023-06-17 NOTE — Progress Notes (Signed)
Post-Op Visit Note   Patient: Elizabeth Jennings           Date of Birth: [birth year]           MRN: [id number] Visit Date: 06/17/2023 PCP: Massie Maroon, FNP   Assessment & Plan:  Chief Complaint:  Chief Complaint  Patient presents with   Right Knee - Routine Post Op   Visit Diagnoses:  1. Status post total right knee replacement     Plan: Patient is a pleasant 77 year old female who comes in today 2 weeks status post right total knee replacement 06/02/2023.  She has been doing okay.  She has been compliant taking baby aspirin twice daily for DVT prophylaxis.  She is taking Percocet and Robaxin for pain.  She is getting home health physical therapy and is ambulating with a walker.  Examination of her right knee reveals a well-healing surgical incision with nylon sutures in place.  No evidence of infection or cellulitis.  Calf is soft nontender.  Negative Homans.  She is neurovascular intact distally.  She does have moderate swelling to the right lower extremity.  Today, sutures were removed and Steri-Strips applied.  She will continue wearing her TED hose during the day but may take them off at night.  Ice and elevate for swelling.  Continue with baby aspirin twice daily for DVT prophylaxis.  We will continue home health physical therapy for an additional week due to transportation issues but will then wean to outpatient physical therapy.  Referral has been made to the [facility] location.  Follow-up in 4 weeks for repeat evaluation and 2 view x-rays of the right knee.  Call with concerns or questions.  Follow-Up Instructions: Return in about 4 weeks (around 07/15/2023).   Orders:  Orders Placed This Encounter  Procedures   Ambulatory referral to Physical Therapy   No orders of the defined types were placed in this encounter.   Imaging: No new imaging  PMFS History: Patient Active Problem List   Diagnosis Date Noted   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    Facial swelling 12/25/2022   Primary osteoarthritis of right knee 06/21/2022   Chronic pain of right knee 06/14/2022   Left-sided Bell's palsy 12/12/2020   Ptosis of left eyelid 12/12/2020   Left leg paresthesias 12/12/2020   Refusal of blood transfusions as patient is Jehovah's Witness 02/26/2017   Prediabetes 04/11/2016   Vitamin D deficiency 04/11/2016   Essential hypertension 04/10/2016   Arthritis 04/10/2016   Osteoporosis 04/10/2016   Obesity 04/10/2016   Hyperglycemia 04/10/2016   Environmental allergies 04/10/2016   Language barrier to communication 04/10/2016   Past Medical History:  Diagnosis Date   Bell's palsy    Hemorrhoids    Hyperlipidemia    Hypertension    Osteoarthritis    right knee   Osteoporosis    Prediabetes     Family History  Problem Relation Age of Onset   Breast cancer Neg Hx     Past Surgical History:  Procedure Laterality Date   EYE SURGERY  2017   cataracts both eyes    EYE SURGERY  2018   eye lid surgery    TOTAL KNEE ARTHROPLASTY Right 06/02/2023   Procedure: RIGHT TOTAL KNEE ARTHROPLASTY;  Surgeon: Tarry Kos, MD;  Location: [facility] OR;  Service: Orthopedics;  Laterality: Right;   Social History   Occupational History   Not on file  Tobacco Use   Smoking status: Never  Passive exposure: Never   Smokeless tobacco: Never  Vaping Use   Vaping status: Never Used  Substance and Sexual Activity   Alcohol use: No   Drug use: No   Sexual activity: Not Currently    Birth control/protection: None

## 2023-06-19 DIAGNOSIS — I10 Essential (primary) hypertension: Secondary | ICD-10-CM | POA: Diagnosis not present

## 2023-06-19 DIAGNOSIS — M810 Age-related osteoporosis without current pathological fracture: Secondary | ICD-10-CM | POA: Diagnosis not present

## 2023-06-19 DIAGNOSIS — E559 Vitamin D deficiency, unspecified: Secondary | ICD-10-CM | POA: Diagnosis not present

## 2023-06-19 DIAGNOSIS — Z471 Aftercare following joint replacement surgery: Secondary | ICD-10-CM | POA: Diagnosis not present

## 2023-06-19 DIAGNOSIS — E785 Hyperlipidemia, unspecified: Secondary | ICD-10-CM | POA: Diagnosis not present

## 2023-06-19 DIAGNOSIS — Z96651 Presence of right artificial knee joint: Secondary | ICD-10-CM | POA: Diagnosis not present

## 2023-06-20 ENCOUNTER — Telehealth: Payer: Self-pay | Admitting: Orthopaedic Surgery

## 2023-06-20 DIAGNOSIS — E559 Vitamin D deficiency, unspecified: Secondary | ICD-10-CM | POA: Diagnosis not present

## 2023-06-20 DIAGNOSIS — I10 Essential (primary) hypertension: Secondary | ICD-10-CM | POA: Diagnosis not present

## 2023-06-20 DIAGNOSIS — E785 Hyperlipidemia, unspecified: Secondary | ICD-10-CM | POA: Diagnosis not present

## 2023-06-20 DIAGNOSIS — Z471 Aftercare following joint replacement surgery: Secondary | ICD-10-CM | POA: Diagnosis not present

## 2023-06-20 DIAGNOSIS — Z96651 Presence of right artificial knee joint: Secondary | ICD-10-CM | POA: Diagnosis not present

## 2023-06-20 DIAGNOSIS — M810 Age-related osteoporosis without current pathological fracture: Secondary | ICD-10-CM | POA: Diagnosis not present

## 2023-06-20 NOTE — Telephone Encounter (Signed)
Spoke with Nino Glow (PT) with [facility] asked if it is ok to move patient's discharge to next week per patient request? [place] asked if patient can continue HHPT 2 Wk 2 and 1 Wk 2.   The number to contact Nino Glow is [phone]

## 2023-06-20 NOTE — Telephone Encounter (Signed)
Gave verbal to [facility].

## 2023-06-24 DIAGNOSIS — Z471 Aftercare following joint replacement surgery: Secondary | ICD-10-CM | POA: Diagnosis not present

## 2023-06-24 DIAGNOSIS — M810 Age-related osteoporosis without current pathological fracture: Secondary | ICD-10-CM | POA: Diagnosis not present

## 2023-06-24 DIAGNOSIS — Z96651 Presence of right artificial knee joint: Secondary | ICD-10-CM | POA: Diagnosis not present

## 2023-06-24 DIAGNOSIS — E785 Hyperlipidemia, unspecified: Secondary | ICD-10-CM | POA: Diagnosis not present

## 2023-06-24 DIAGNOSIS — I10 Essential (primary) hypertension: Secondary | ICD-10-CM | POA: Diagnosis not present

## 2023-06-24 DIAGNOSIS — E559 Vitamin D deficiency, unspecified: Secondary | ICD-10-CM | POA: Diagnosis not present

## 2023-06-27 ENCOUNTER — Telehealth: Payer: Self-pay | Admitting: Orthopaedic Surgery

## 2023-06-27 ENCOUNTER — Other Ambulatory Visit: Payer: Self-pay | Admitting: Physician Assistant

## 2023-06-27 DIAGNOSIS — E559 Vitamin D deficiency, unspecified: Secondary | ICD-10-CM | POA: Diagnosis not present

## 2023-06-27 DIAGNOSIS — Z471 Aftercare following joint replacement surgery: Secondary | ICD-10-CM | POA: Diagnosis not present

## 2023-06-27 DIAGNOSIS — E785 Hyperlipidemia, unspecified: Secondary | ICD-10-CM | POA: Diagnosis not present

## 2023-06-27 DIAGNOSIS — Z96651 Presence of right artificial knee joint: Secondary | ICD-10-CM | POA: Diagnosis not present

## 2023-06-27 DIAGNOSIS — M810 Age-related osteoporosis without current pathological fracture: Secondary | ICD-10-CM | POA: Diagnosis not present

## 2023-06-27 DIAGNOSIS — I10 Essential (primary) hypertension: Secondary | ICD-10-CM | POA: Diagnosis not present

## 2023-06-27 MED ORDER — HYDROCODONE-ACETAMINOPHEN 5-325 MG PO TABS: 1.0000 | ORAL_TABLET | Freq: Three times a day (TID) | ORAL | 0 refills | Status: DC | PRN

## 2023-06-27 NOTE — Telephone Encounter (Signed)
Notified patient.

## 2023-06-27 NOTE — Telephone Encounter (Signed)
Patient called. Would like oxycodone called in for her. Has 1 only. Her call back #[phone]

## 2023-06-27 NOTE — Telephone Encounter (Signed)
Sent   norco

## 2023-07-01 ENCOUNTER — Ambulatory Visit: Payer: Self-pay | Admitting: Family Medicine

## 2023-07-01 DIAGNOSIS — I10 Essential (primary) hypertension: Secondary | ICD-10-CM | POA: Diagnosis not present

## 2023-07-01 DIAGNOSIS — Z471 Aftercare following joint replacement surgery: Secondary | ICD-10-CM | POA: Diagnosis not present

## 2023-07-01 DIAGNOSIS — E559 Vitamin D deficiency, unspecified: Secondary | ICD-10-CM | POA: Diagnosis not present

## 2023-07-01 DIAGNOSIS — M810 Age-related osteoporosis without current pathological fracture: Secondary | ICD-10-CM | POA: Diagnosis not present

## 2023-07-01 DIAGNOSIS — E785 Hyperlipidemia, unspecified: Secondary | ICD-10-CM | POA: Diagnosis not present

## 2023-07-01 DIAGNOSIS — Z96651 Presence of right artificial knee joint: Secondary | ICD-10-CM | POA: Diagnosis not present

## 2023-07-04 DIAGNOSIS — M810 Age-related osteoporosis without current pathological fracture: Secondary | ICD-10-CM | POA: Diagnosis not present

## 2023-07-04 DIAGNOSIS — E669 Obesity, unspecified: Secondary | ICD-10-CM | POA: Diagnosis not present

## 2023-07-04 DIAGNOSIS — E559 Vitamin D deficiency, unspecified: Secondary | ICD-10-CM | POA: Diagnosis not present

## 2023-07-04 DIAGNOSIS — Z96651 Presence of right artificial knee joint: Secondary | ICD-10-CM | POA: Diagnosis not present

## 2023-07-04 DIAGNOSIS — Z7982 Long term (current) use of aspirin: Secondary | ICD-10-CM | POA: Diagnosis not present

## 2023-07-04 DIAGNOSIS — Z471 Aftercare following joint replacement surgery: Secondary | ICD-10-CM | POA: Diagnosis not present

## 2023-07-04 DIAGNOSIS — I10 Essential (primary) hypertension: Secondary | ICD-10-CM | POA: Diagnosis not present

## 2023-07-04 DIAGNOSIS — Z6827 Body mass index (BMI) 27.0-27.9, adult: Secondary | ICD-10-CM | POA: Diagnosis not present

## 2023-07-04 DIAGNOSIS — E785 Hyperlipidemia, unspecified: Secondary | ICD-10-CM | POA: Diagnosis not present

## 2023-07-07 DIAGNOSIS — Z96651 Presence of right artificial knee joint: Secondary | ICD-10-CM | POA: Diagnosis not present

## 2023-07-07 DIAGNOSIS — E559 Vitamin D deficiency, unspecified: Secondary | ICD-10-CM | POA: Diagnosis not present

## 2023-07-07 DIAGNOSIS — I10 Essential (primary) hypertension: Secondary | ICD-10-CM | POA: Diagnosis not present

## 2023-07-07 DIAGNOSIS — Z471 Aftercare following joint replacement surgery: Secondary | ICD-10-CM | POA: Diagnosis not present

## 2023-07-07 DIAGNOSIS — M810 Age-related osteoporosis without current pathological fracture: Secondary | ICD-10-CM | POA: Diagnosis not present

## 2023-07-07 DIAGNOSIS — E785 Hyperlipidemia, unspecified: Secondary | ICD-10-CM | POA: Diagnosis not present

## 2023-07-10 DIAGNOSIS — E785 Hyperlipidemia, unspecified: Secondary | ICD-10-CM | POA: Diagnosis not present

## 2023-07-10 DIAGNOSIS — I10 Essential (primary) hypertension: Secondary | ICD-10-CM | POA: Diagnosis not present

## 2023-07-10 DIAGNOSIS — Z96651 Presence of right artificial knee joint: Secondary | ICD-10-CM | POA: Diagnosis not present

## 2023-07-10 DIAGNOSIS — Z471 Aftercare following joint replacement surgery: Secondary | ICD-10-CM | POA: Diagnosis not present

## 2023-07-10 DIAGNOSIS — E559 Vitamin D deficiency, unspecified: Secondary | ICD-10-CM | POA: Diagnosis not present

## 2023-07-10 DIAGNOSIS — M810 Age-related osteoporosis without current pathological fracture: Secondary | ICD-10-CM | POA: Diagnosis not present

## 2023-07-16 DIAGNOSIS — I10 Essential (primary) hypertension: Secondary | ICD-10-CM | POA: Diagnosis not present

## 2023-07-16 DIAGNOSIS — Z96651 Presence of right artificial knee joint: Secondary | ICD-10-CM | POA: Diagnosis not present

## 2023-07-16 DIAGNOSIS — M810 Age-related osteoporosis without current pathological fracture: Secondary | ICD-10-CM | POA: Diagnosis not present

## 2023-07-16 DIAGNOSIS — E785 Hyperlipidemia, unspecified: Secondary | ICD-10-CM | POA: Diagnosis not present

## 2023-07-16 DIAGNOSIS — E559 Vitamin D deficiency, unspecified: Secondary | ICD-10-CM | POA: Diagnosis not present

## 2023-07-16 DIAGNOSIS — Z471 Aftercare following joint replacement surgery: Secondary | ICD-10-CM | POA: Diagnosis not present

## 2023-07-25 ENCOUNTER — Ambulatory Visit: Payer: Medicare Other | Admitting: Orthopaedic Surgery

## 2023-07-25 DIAGNOSIS — Z471 Aftercare following joint replacement surgery: Secondary | ICD-10-CM | POA: Diagnosis not present

## 2023-07-25 DIAGNOSIS — M810 Age-related osteoporosis without current pathological fracture: Secondary | ICD-10-CM | POA: Diagnosis not present

## 2023-07-25 DIAGNOSIS — Z96651 Presence of right artificial knee joint: Secondary | ICD-10-CM | POA: Diagnosis not present

## 2023-07-25 DIAGNOSIS — E559 Vitamin D deficiency, unspecified: Secondary | ICD-10-CM | POA: Diagnosis not present

## 2023-07-25 DIAGNOSIS — E785 Hyperlipidemia, unspecified: Secondary | ICD-10-CM | POA: Diagnosis not present

## 2023-07-25 DIAGNOSIS — I10 Essential (primary) hypertension: Secondary | ICD-10-CM | POA: Diagnosis not present

## 2023-07-29 ENCOUNTER — Other Ambulatory Visit (INDEPENDENT_AMBULATORY_CARE_PROVIDER_SITE_OTHER): Payer: Medicare Other

## 2023-07-29 ENCOUNTER — Ambulatory Visit (INDEPENDENT_AMBULATORY_CARE_PROVIDER_SITE_OTHER): Payer: Medicare Other | Admitting: Orthopaedic Surgery

## 2023-07-29 ENCOUNTER — Telehealth: Payer: Self-pay | Admitting: *Deleted

## 2023-07-29 ENCOUNTER — Encounter: Payer: Self-pay | Admitting: Orthopaedic Surgery

## 2023-07-29 DIAGNOSIS — Z96651 Presence of right artificial knee joint: Secondary | ICD-10-CM

## 2023-07-29 NOTE — Progress Notes (Signed)
   Post-Op Visit Note   Patient: Elizabeth Jennings           Date of Birth: [birth year]           MRN: [id number] Visit Date: 07/29/2023 PCP: Massie Maroon, FNP   Assessment & Plan:  Chief Complaint:  Chief Complaint  Patient presents with   Right Knee - Follow-up    Right total knee arthroplasty 06/02/2023   Visit Diagnoses:  1. Status post total right knee replacement     Plan: Patient is a 77 year old female who is 2 months status post right total knee replacement.  She only has discomfort at night.  She has finished home health PT.  She is doing well overall.  She feels like she is able to walk a lot more and better.  She has noticed significant improvement in pain.  Examination of the right knee shows fully healed surgical scar.  Range of motion is 0 to 100 degrees.  Collaterals are stable.  No signs of infection.  The implant looks stable on x-rays.  She will continue to do her home exercises.  Dental prophylaxis reinforced.  Recheck in 3 months with two-view x-rays of the right knee.  Follow-Up Instructions: Return in about 3 months (around 10/28/2023).   Orders:  Orders Placed This Encounter  Procedures   XR Knee 1-2 Views Right   No orders of the defined types were placed in this encounter.   Imaging: XR Knee 1-2 Views Right  Result Date: 07/29/2023 X-rays of the right knee demonstrate a stable right total knee replacement in good alignment    PMFS History: Patient Active Problem List   Diagnosis Date Noted   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   Facial swelling 12/25/2022   Primary osteoarthritis of right knee 06/21/2022   Chronic pain of right knee 06/14/2022   Left-sided Bell's palsy 12/12/2020   Ptosis of left eyelid 12/12/2020   Left leg paresthesias 12/12/2020   Refusal of blood transfusions as patient is Jehovah's Witness 02/26/2017   Prediabetes 04/11/2016   Vitamin D deficiency 04/11/2016   Essential hypertension 04/10/2016    Arthritis 04/10/2016   Osteoporosis 04/10/2016   Obesity 04/10/2016   Hyperglycemia 04/10/2016   Environmental allergies 04/10/2016   Language barrier to communication 04/10/2016   Past Medical History:  Diagnosis Date   Bell's palsy    Hemorrhoids    Hyperlipidemia    Hypertension    Osteoarthritis    right knee   Osteoporosis    Prediabetes     Family History  Problem Relation Age of Onset   Breast cancer Neg Hx     Past Surgical History:  Procedure Laterality Date   EYE SURGERY  2017   cataracts both eyes    EYE SURGERY  2018   eye lid surgery    TOTAL KNEE ARTHROPLASTY Right 06/02/2023   Procedure: RIGHT TOTAL KNEE ARTHROPLASTY;  Surgeon: Tarry Kos, MD;  Location: [facility] OR;  Service: Orthopedics;  Laterality: Right;   Social History   Occupational History   Not on file  Tobacco Use   Smoking status: Never    Passive exposure: Never   Smokeless tobacco: Never  Vaping Use   Vaping status: Never Used  Substance and Sexual Activity   Alcohol use: No   Drug use: No   Sexual activity: Not Currently    Birth control/protection: None

## 2023-07-29 NOTE — Telephone Encounter (Signed)
Ortho bundle call. Late entry for D/C call.

## 2023-08-27 ENCOUNTER — Other Ambulatory Visit: Payer: Self-pay | Admitting: Physician Assistant

## 2023-10-21 ENCOUNTER — Ambulatory Visit: Payer: Self-pay | Admitting: Family Medicine

## 2023-10-21 NOTE — Telephone Encounter (Signed)
Copied from CRM [id number]. Topic: Clinical - Red Word Triage >> Oct 21, 2023  2:45 PM Hendricks Limes wrote: Red Word that prompted transfer to Nurse Triage: Bulge at the opening of the vagina   Chief Complaint: Vaginal Protrusion Symptoms: Discomfort Frequency: About a month Pertinent Negatives: Patient denies  Disposition: [] ED /[] Urgent Care (no appt availability in office) / [x] Appointment(In office/virtual)/ []  [facility]/ [] Home Care/ [] Refused Recommended Disposition /[] [facility]/ []  Follow-up with PCP Additional Notes: Neighbor of the patient called regarding a protrusion from the patient's vaginal canal. Patient does require an interpreter for appropriate triage. In office appointment made with PCP office. Educated the patient and representative to call back if symptoms changed or worsened.      Reason for Disposition  [1] Vaginal FB AND [2] can't remove at home  Answer Assessment - Initial Assessment Questions 1. OBJECT: "What do you think it is?"      Protrusion of internal organs 2. SIZE: "How large is it?"      Moderate 3. PAIN: "Are you having any pain?" If Yes, ask: "How bad is it?"  (Scale 1-10; or mild, moderate, severe)     Left Lower Quadrant Pain, 6, intermittent 4. ONSET: "How long do you think the foreign body has been there?"      About a month 5. MECHANISM: "Tell me how it happened."     Unsure of onset. 6. OTHER SYMPTOMS: "Do you have any other symptoms?" (e.g., vaginal discharge, fever, rash)     No other symptoms.  7. PREGNANCY: "Is there any chance you are pregnant?" "When was your last menstrual period?"     No  Protocols used: Vaginal Foreign Body-A-AH

## 2023-10-22 ENCOUNTER — Ambulatory Visit (INDEPENDENT_AMBULATORY_CARE_PROVIDER_SITE_OTHER): Payer: Medicare Other | Admitting: Nurse Practitioner

## 2023-10-22 ENCOUNTER — Encounter: Payer: Self-pay | Admitting: Nurse Practitioner

## 2023-10-22 VITALS — BP 140/78 | HR 64 | Temp 97.1°F | Wt 141.0 lb

## 2023-10-22 DIAGNOSIS — L299 Pruritus, unspecified: Secondary | ICD-10-CM | POA: Insufficient documentation

## 2023-10-22 DIAGNOSIS — R519 Headache, unspecified: Secondary | ICD-10-CM

## 2023-10-22 DIAGNOSIS — H6122 Impacted cerumen, left ear: Secondary | ICD-10-CM | POA: Diagnosis not present

## 2023-10-22 DIAGNOSIS — Z23 Encounter for immunization: Secondary | ICD-10-CM | POA: Diagnosis not present

## 2023-10-22 DIAGNOSIS — N3289 Other specified disorders of bladder: Secondary | ICD-10-CM

## 2023-10-22 LAB — POCT URINALYSIS DIP (CLINITEK)
Blood, UA: NEGATIVE
Glucose, UA: NEGATIVE mg/dL
Leukocytes, UA: NEGATIVE
Nitrite, UA: NEGATIVE
Spec Grav, UA: 1.025 (ref 1.010–1.025)
Urobilinogen, UA: 0.2 U/dL
pH, UA: 5.5 (ref 5.0–8.0)

## 2023-10-22 NOTE — Assessment & Plan Note (Signed)
No redness or swelling noted on examination today Patient encouraged to take Tylenol 650 mg every 6 hours as needed for pain

## 2023-10-22 NOTE — Assessment & Plan Note (Signed)
Ear lavage performed in the office today for impacted cerumen .

## 2023-10-22 NOTE — Patient Instructions (Signed)
Please take Tylenol 650 mg every 6 hours as needed for pain.  1. Bladder distension  - Ambulatory referral to Gynecology  2. Impacted cerumen of left ear   3. Facial pain    It is important that you exercise regularly at least 30 minutes 5 times a week as tolerated  Think about what you will eat, plan ahead. Choose " clean, green, fresh or frozen" over canned, processed or packaged foods which are more sugary, salty and fatty. 70 to 75% of food eaten should be vegetables and fruit. Three meals at set times with snacks allowed between meals, but they must be fruit or vegetables. Aim to eat over a 12 hour period , example 7 am to 7 pm, and STOP after  your last meal of the day. Drink water,generally about 64 ounces per day, no other drink is as healthy. Fruit juice is best enjoyed in a healthy way, by EATING the fruit.  Thanks for choosing [facility] we consider it a privelige to serve you.

## 2023-10-22 NOTE — Assessment & Plan Note (Signed)

## 2023-10-22 NOTE — Progress Notes (Signed)
Acute Office Visit  Subjective:     Patient ID: Elizabeth Jennings, female    DOB: 05/03/1946, 77 y.o.   MRN: [id number]  Chief Complaint  Patient presents with   bladder issue    Over a month, feels like it is falling     HPI Elizabeth Jennings  has a past medical history of Bell's palsy, Hemorrhoids, Hyperlipidemia, Hypertension, Osteoarthritis, Osteoporosis, and Prediabetes.  Patient presents with complaints of feelings of ''her bladder falling'' for over a month.  She denies dysuria, hematuria, urinary frequency, urinary hesitancy, abdominal pain, nausea, vomiting  Patient complains of left-sided facial soreness, patient was treated for cellulitis and shingles in January and February this year.  She reports numbness of her lip since she had shingles.  She denies tingling, burning sensation.    Patient complains of itchy ears worse at night.  No complaints of fever, ear pain, ear discharge  Interpretation services provided via [place] iPad   Review of Systems  Constitutional:  Negative for appetite change, chills, fatigue and fever.  HENT:  Negative for congestion, postnasal drip, rhinorrhea and sneezing.   Respiratory:  Negative for cough, shortness of breath and wheezing.   Cardiovascular:  Negative for chest pain, palpitations and leg swelling.  Gastrointestinal:  Negative for abdominal pain, constipation, nausea and vomiting.  Genitourinary:  Negative for difficulty urinating, dysuria, flank pain and frequency.  Musculoskeletal:  Negative for back pain, joint swelling and myalgias.  Skin:  Negative for color change, pallor, rash and wound.  Neurological:  Negative for dizziness, facial asymmetry, weakness, numbness and headaches.  Psychiatric/Behavioral:  Negative for behavioral problems, confusion, self-injury and suicidal ideas.         Objective:    BP (!) 140/78   Pulse 64   Temp (!) 97.1 F (36.2 C)   Wt 141 lb (64 kg)   SpO2 96%   BMI 29.47 kg/m    Physical  Exam Vitals and nursing note reviewed.  Constitutional:      General: She is not in acute distress.    Appearance: Normal appearance. She is not ill-appearing, toxic-appearing or diaphoretic.  HENT:     Right Ear: Tympanic membrane, ear canal and external ear normal. There is impacted cerumen.     Left Ear: Tympanic membrane, ear canal and external ear normal. There is no impacted cerumen.     Mouth/Throat:     Mouth: Mucous membranes are moist.     Pharynx: Oropharynx is clear. No oropharyngeal exudate or posterior oropharyngeal erythema.  Eyes:     General: No scleral icterus.       Right eye: No discharge.        Left eye: No discharge.     Extraocular Movements: Extraocular movements intact.     Conjunctiva/sclera: Conjunctivae normal.  Cardiovascular:     Rate and Rhythm: Normal rate and regular rhythm.     Pulses: Normal pulses.     Heart sounds: Normal heart sounds. No murmur heard.    No friction rub. No gallop.  Pulmonary:     Effort: Pulmonary effort is normal. No respiratory distress.     Breath sounds: Normal breath sounds. No stridor. No wheezing, rhonchi or rales.  Chest:     Chest wall: No tenderness.  Abdominal:     General: There is no distension.     Palpations: Abdomen is soft.     Tenderness: There is no abdominal tenderness. There is no right CVA tenderness, left CVA tenderness or  guarding.  Genitourinary:    Exam position: Lithotomy position.     Pubic Area: No rash or pubic lice.      Labia:        Right: No rash, tenderness, lesion or injury.        Left: No rash, tenderness, lesion or injury.      Urethra: No prolapse, urethral pain, urethral swelling or urethral lesion.     Vagina: No signs of injury and foreign body. No vaginal discharge, erythema, tenderness, bleeding, lesions or prolapsed vaginal walls.  Musculoskeletal:        General: No signs of injury.     Right lower leg: No edema.     Left lower leg: No edema.  Skin:    General: Skin is  warm and dry.     Capillary Refill: Capillary refill takes less than 2 seconds.     Coloration: Skin is not jaundiced or pale.     Findings: No bruising, erythema or lesion.  Neurological:     Mental Status: She is alert and oriented to person, place, and time.     Motor: No weakness.     Coordination: Coordination normal.     Gait: Gait normal.  Psychiatric:        Mood and Affect: Mood normal.        Behavior: Behavior normal.        Thought Content: Thought content normal.        Judgment: Judgment normal.     Results for orders placed or performed in visit on 10/22/23  POCT URINALYSIS DIP (CLINITEK)  Result Value Ref Range   Color, UA yellow yellow   Clarity, UA clear clear   Glucose, UA negative negative mg/dL   Bilirubin, UA small (A) negative   Ketones, POC UA trace (5) (A) negative mg/dL   Spec Grav, UA 7.322 0.254 - 1.025   Blood, UA negative negative   pH, UA 5.5 5.0 - 8.0   POC PROTEIN,UA trace negative, trace   Urobilinogen, UA 0.2 0.2 or 1.0 E.U./dL   Nitrite, UA Negative Negative   Leukocytes, UA Negative Negative        Assessment & Plan:   Problem List Items Addressed This Visit       Nervous and Auditory   Impacted cerumen of left ear    Ear lavage performed in the office today for impacted cerumen .         Other   Bladder distension - Primary    UA negative for UTI No bladder prolapse noted  will referred patient to urology for further evaluation      Relevant Orders   Ambulatory referral to Urology   POCT URINALYSIS DIP (CLINITEK) (Completed)   Facial pain    No redness or swelling noted on examination today Patient encouraged to take Tylenol 650 mg every 6 hours as needed for pain      Need for influenza vaccination    Patient educated on CDC recommendation for the vaccine. Verbal consent was obtained from the patient, vaccine administered by nurse, no sign of adverse reactions noted at this time. Patient education on arm soreness  and use of tylenol for this patient  was discussed. Patient educated on the signs and symptoms of adverse effect and advise to contact the office if they occur. Vaccine information sheet given to patient.        Relevant Orders   Flu Vaccine QUAD High Dose(Fluad) (Completed)  No orders of the defined types were placed in this encounter.   Return in about 4 months (around 02/20/2024).  Donell Beers, FNP

## 2023-10-22 NOTE — Assessment & Plan Note (Signed)
UA negative for UTI No bladder prolapse noted  will referred patient to urology for further evaluation

## 2023-10-27 NOTE — Progress Notes (Unsigned)
No show

## 2023-10-28 ENCOUNTER — Ambulatory Visit (INDEPENDENT_AMBULATORY_CARE_PROVIDER_SITE_OTHER): Payer: Medicare Other | Admitting: Orthopaedic Surgery

## 2023-10-28 DIAGNOSIS — Z96651 Presence of right artificial knee joint: Secondary | ICD-10-CM

## 2023-11-07 ENCOUNTER — Telehealth: Payer: Self-pay | Admitting: *Deleted

## 2023-11-07 NOTE — Telephone Encounter (Signed)
Ortho bundle CM following completed.

## 2024-02-18 ENCOUNTER — Ambulatory Visit: Payer: Self-pay | Admitting: Nurse Practitioner

## 2024-04-27 ENCOUNTER — Other Ambulatory Visit (INDEPENDENT_AMBULATORY_CARE_PROVIDER_SITE_OTHER)

## 2024-04-27 ENCOUNTER — Ambulatory Visit (INDEPENDENT_AMBULATORY_CARE_PROVIDER_SITE_OTHER): Admitting: Orthopaedic Surgery

## 2024-04-27 ENCOUNTER — Telehealth: Payer: Self-pay

## 2024-04-27 DIAGNOSIS — Z96651 Presence of right artificial knee joint: Secondary | ICD-10-CM

## 2024-04-27 DIAGNOSIS — M1712 Unilateral primary osteoarthritis, left knee: Secondary | ICD-10-CM

## 2024-04-27 DIAGNOSIS — M722 Plantar fascial fibromatosis: Secondary | ICD-10-CM | POA: Diagnosis not present

## 2024-04-27 NOTE — Progress Notes (Signed)
 Office Visit Note   Patient: Elizabeth Jennings           Date of Birth: 03/08/1946           MRN: [id number] Visit Date: 04/27/2024              Requested by: Sigurd Driver, FNP [address] [place],  [place] [address] PCP: Sigurd Driver, FNP   Assessment & Plan: Visit Diagnoses:  1. Primary osteoarthritis of left knee   2. Status post total right knee replacement   3. Plantar fasciitis of left foot     Plan: History of Present Illness Elizabeth Jennings is a 78 year old female who presents with bilateral knee pain and left plantar fasciitis.  She experiences bilateral knee pain, with the right knee having undergone replacement nearly a year ago. Despite the surgery, there is persistent pain and cramping at night in the right knee.  But the pain is manageable and the knee is functional.  She is overall pleased with the outcome.  The left knee is more problematic, with pain radiating from the knee to the buttocks, described as throbbing at night with cramps during sleep. She prefers surgical intervention for her knee issues, having had a positive outcome with the right knee replacement. There is no relief from injections for her knee pain. Additionally, she has pain in the left heel consistent with plantar fasciitis, with cramping at night.  Physical Exam MUSCULOSKELETAL: Right knee scar well-healed, flexibility good. Right knee ligaments intact.  Left knee exam shows slight varus deformity.  Significant pain along the medial joint line.  Pain and crepitus throughout range of motion.  Range of motion is 0 to 95 degrees with pain.  Results RADIOLOGY Right knee X-ray: Implants are well-positioned. Left knee X-ray: Severe osteoarthritis, bone-on-bone medial compartment  Assessment and Plan Left knee osteoarthritis Severe osteoarthritis with bone-on-bone contact. Prefers surgery due to previous successful right knee replacement. - Obtain surgical clearance from primary care  physician for left knee replacement. - Schedule left knee replacement surgery once clearance is obtained.  Plantar fasciitis Pain in left heel likely exacerbated by compensatory mechanisms from left knee osteoarthritis. - Provide exercises for plantar fasciitis.  Right knee replacement (post-surgical status) One year post-surgery with good healing and implant positioning. Mild pain and cramps due to muscle weakness. - Continue physical therapy and exercises to strengthen muscles around the right knee.  Impression is severe left knee degenerative joint disease secondary to Osteoarthritis.  Bone on bone joint space narrowing is seen on radiographs with mild varus alignment.  At this point, conservative treatments fail to provide any significant relief and the pain is severely affecting ADLs and quality of life.  Based on treatment options, the patient has elected to move forward with a knee replacement.  We have discussed the surgical risks that include but are not limited to infection, DVT, leg length discrepancy, stiffness, numbness, tingling, incomplete relief of pain.  Recovery and prognosis were also reviewed.    Anticoagulants: No antithrombotic Postop anticoagulation: Eliquis Diabetic: No  Nickel allergy : No Prior DVT/PE: No Tobacco use: No Clearances needed for surgery: Elizabeth Jennings Anticipated discharge dispo: Home   Follow-Up Instructions: No follow-ups on file.   Orders:  Orders Placed This Encounter  Procedures   XR Knee 1-2 Views Right   XR KNEE 3 VIEW LEFT   No orders of the defined types were placed in this encounter.     Procedures: No procedures  performed   Clinical Data: No additional findings.   Subjective: Chief Complaint  Patient presents with   Left Knee - Pain   Right Knee - Follow-up    HPI  Review of Systems  Constitutional: Negative.   HENT: Negative.    Eyes: Negative.   Respiratory: Negative.    Cardiovascular: Negative.    Endocrine: Negative.   Musculoskeletal: Negative.   Neurological: Negative.   Hematological: Negative.   Psychiatric/Behavioral: Negative.    All other systems reviewed and are negative.    Objective: Vital Signs: There were no vitals taken for this visit.  Physical Exam Vitals and nursing note reviewed.  Constitutional:      Appearance: She is well-developed.  HENT:     Head: Atraumatic.     Nose: Nose normal.  Eyes:     Extraocular Movements: Extraocular movements intact.  Cardiovascular:     Pulses: Normal pulses.  Pulmonary:     Effort: Pulmonary effort is normal.  Abdominal:     Palpations: Abdomen is soft.  Musculoskeletal:     Cervical back: Neck supple.  Skin:    General: Skin is warm.     Capillary Refill: Capillary refill takes less than 2 seconds.  Neurological:     Mental Status: She is alert. Mental status is at baseline.  Psychiatric:        Behavior: Behavior normal.        Thought Content: Thought content normal.        Judgment: Judgment normal.     Ortho Exam  Specialty Comments:  No specialty comments available.  Imaging: XR Knee 1-2 Views Right Result Date: 04/27/2024 X-rays of the right knee demonstrate a stable right total knee replacement in good alignment   XR KNEE 3 VIEW LEFT Result Date: 04/27/2024 X-rays of the left knee show advanced tricompartmental degenerative joint disease.  Bone-on-bone joint space narrowing.  Mild varus deformity    PMFS History: Patient Active Problem List   Diagnosis Date Noted   Plantar fasciitis of left foot 04/27/2024   Primary osteoarthritis of left knee 04/27/2024   Bladder distension 10/22/2023   Itching of ear 10/22/2023   Impacted cerumen of left ear 10/22/2023   Facial pain 10/22/2023   Need for influenza vaccination 10/22/2023   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   Facial swelling 12/25/2022   Primary osteoarthritis of right knee 06/21/2022   Chronic pain of right knee  06/14/2022   Left-sided Bell's palsy 12/12/2020   Ptosis of left eyelid 12/12/2020   Left leg paresthesias 12/12/2020   Refusal of blood transfusions as patient is Jehovah's Witness 02/26/2017   Prediabetes 04/11/2016   Vitamin D  deficiency 04/11/2016   Essential hypertension 04/10/2016   Arthritis 04/10/2016   Osteoporosis 04/10/2016   Obesity 04/10/2016   Hyperglycemia 04/10/2016   Environmental allergies 04/10/2016   Language barrier to communication 04/10/2016   Past Medical History:  Diagnosis Date   Bell's palsy    Hemorrhoids    Hyperlipidemia    Hypertension    Osteoarthritis    right knee   Osteoporosis    Prediabetes     Family History  Problem Relation Age of Onset   Breast cancer Neg Hx     Past Surgical History:  Procedure Laterality Date   EYE SURGERY  2017   cataracts both eyes    EYE SURGERY  2018   eye lid surgery    TOTAL KNEE ARTHROPLASTY Right 06/02/2023   Procedure: RIGHT TOTAL  KNEE ARTHROPLASTY;  Surgeon: Wes Hamman, MD;  Location: [facility] OR;  Service: Orthopedics;  Laterality: Right;   Social History   Occupational History   Not on file  Tobacco Use   Smoking status: Never    Passive exposure: Never   Smokeless tobacco: Never  Vaping Use   Vaping status: Never Used  Substance and Sexual Activity   Alcohol use: No   Drug use: No   Sexual activity: Not Currently    Birth control/protection: None

## 2024-04-27 NOTE — Telephone Encounter (Signed)
 Faxed clearance sheet to PCP. Sigurd Driver, FNP FAX [phone]

## 2024-05-25 ENCOUNTER — Ambulatory Visit: Admitting: Physician Assistant

## 2024-05-25 ENCOUNTER — Other Ambulatory Visit: Payer: Self-pay | Admitting: Nurse Practitioner

## 2024-05-25 ENCOUNTER — Encounter: Payer: Self-pay | Admitting: Physician Assistant

## 2024-05-25 ENCOUNTER — Ambulatory Visit: Payer: Self-pay

## 2024-05-25 VITALS — BP 173/74 | HR 67 | Ht 59.0 in | Wt 163.0 lb

## 2024-05-25 DIAGNOSIS — E559 Vitamin D deficiency, unspecified: Secondary | ICD-10-CM

## 2024-05-25 DIAGNOSIS — J3089 Other allergic rhinitis: Secondary | ICD-10-CM

## 2024-05-25 DIAGNOSIS — D649 Anemia, unspecified: Secondary | ICD-10-CM | POA: Diagnosis not present

## 2024-05-25 DIAGNOSIS — Z9109 Other allergy status, other than to drugs and biological substances: Secondary | ICD-10-CM

## 2024-05-25 DIAGNOSIS — Z8619 Personal history of other infectious and parasitic diseases: Secondary | ICD-10-CM

## 2024-05-25 DIAGNOSIS — I10 Essential (primary) hypertension: Secondary | ICD-10-CM | POA: Diagnosis not present

## 2024-05-25 DIAGNOSIS — Z23 Encounter for immunization: Secondary | ICD-10-CM

## 2024-05-25 DIAGNOSIS — R7303 Prediabetes: Secondary | ICD-10-CM

## 2024-05-25 DIAGNOSIS — Z1231 Encounter for screening mammogram for malignant neoplasm of breast: Secondary | ICD-10-CM

## 2024-05-25 MED ORDER — GABAPENTIN 100 MG PO CAPS: 100.0000 mg | ORAL_CAPSULE | Freq: Two times a day (BID) | ORAL | 1 refills | Status: DC

## 2024-05-25 MED ORDER — CETIRIZINE HCL 10 MG PO TABS: 10.0000 mg | ORAL_TABLET | Freq: Every day | ORAL | 3 refills | Status: DC

## 2024-05-25 NOTE — Telephone Encounter (Signed)
 FYI Only or Action Required?: FYI only for provider.  Patient was last seen in primary care on 10/22/2023 by Paseda, Folashade R, FNP.  Called Nurse Triage reporting facial itching.  Symptoms began about a month ago.  Interventions attempted: OTC medications: lotion.  Symptoms are: gradually worsening.  Triage Disposition: See PCP Within 2 Weeks  Patient/caregiver understands and will follow disposition?: Yes    Copied from CRM [id number]. Topic: Clinical - Red Word Triage >> May 25, 2024 11:25 AM Elizabeth Jennings wrote: [facility] that prompted transfer to Nurse Triage: Received call from patient son, Elizabeth Jennings, (DPR verified), states patient, his mother, is having increased itching around facial area, lips, and nose, along with numbness. Reason for Disposition  Itching is a chronic symptom (recurrent or ongoing AND present > 4 weeks)  Answer Assessment - Initial Assessment Questions 1. DESCRIPTION: Describe the itching you are having.     Upper lip and nose area 2. SEVERITY: How bad is it?    - MILD: Doesn'Jennings interfere with normal activities.   - MODERATE-SEVERE: Interferes with work, school, sleep, or other activities.      Very itching when happening 3. SCRATCHING: Are there any scratch marks? Bleeding?     np 4. ONSET: When did this begin?      A month ago 5. CAUSE: What do you think is causing the itching? (ask about swimming pools, pollen, animals, soaps, etc.)     Previously had shingles in that area 6. OTHER SYMPTOMS: Do you have any other symptoms?      numbness  Protocols used: Itching - [facility]

## 2024-05-25 NOTE — Patient Instructions (Addendum)
 VISIT SUMMARY:  Today, we discussed your persistent itching and numbness following a shingles infection, as well as your blood pressure, prediabetes, vitamin D  levels, and possible allergies. We have made some changes to your medications and recommended some tests and vaccinations to help manage your symptoms and overall health.  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 We will start you on gabapentin , to be taken twice daily, to help manage the itching and numbness. Please follow up in 2-3 weeks to see how the medication is working.  -HYPERTENSION: Hypertension is high blood pressure. Your blood pressure was elevated today. We recommend you get a blood pressure cuff to monitor your blood pressure at home. If your readings remain high, we may need to adjust your medication.  -PREDIABETES: Prediabetes means your blood sugar levels are higher than normal but not high enough to be classified as diabetes. We will monitor your blood sugar levels with an A1c test.  -VITAMIN D  DEFICIENCY: Vitamin D  deficiency means you have lower than normal levels of vitamin D , which is important for bone health. We will check your vitamin D  levels with a blood test and may prescribe vitamin D  supplements based on the results.  -ALLERGIC RHINITIS: Allergic rhinitis is an allergic reaction that causes itchy and runny nose, and possibly itchy ears. You will take Zyrtec  once daily . Please bring the bottle of your previous ear medication to your next appointment if your symptoms continue.  Dolor nervioso posterior a la culebrilla Nerve Pain After Shingles La neuralgia postherptica (NPH) es el dolor nervioso que se puede sentir despus de tener la [place]. La culebrilla es una infeccin que produce una erupcin cutnea y ampollas dolorosas. Es causada por el mismo microbio que produce la varicela. La NPH afecta la zona del cuerpo donde tuvo la  erupcin cutnea de la culebrilla. Puede durar 3 meses despus de que la erupcin haya desaparecido. Cules son las causas? La causa de la NPH puede ser el [employer] nervios. Este dao puede deberse a la hinchazn que produce la infeccin de la culebrilla. Qu incrementa el riesgo? Es ms probable que desarrolle NPH si: Es mayor de 44 aos. Siente dolor intenso antes de que aparezca la erupcin cutnea. Tiene una erupcin muy severa. Tiene [place] y alrededor del ojo. Su sistema de defensa del cuerpo (sistema inmunitario) est debilitado. Cules son los signos o sntomas? El sntoma principal de la NPH es Chief Technology Officer. El dolor puede [employer] siguientes caractersticas: Ser punzante, urente o Blain. Sentirse como Customer service manager. Puede ser intermitente o Wilkerson. Empeora si: Algo le toca la piel. Sube o baja la temperatura. Tambin puede tener picazn. Cmo se diagnostica? La NPH puede diagnosticarse en funcin de lo siguiente: Sus sntomas. Si ha tenido [employer]. Cmo se trata? No hay ignacia all, pero el tratamiento puede ayudar a Human resources officer. Es posible que los analgsicos normales no brinden [place]. Es posible que deba trabajar con un experto en el tratamiento del dolor para encontrar el que mejor acte en usted. El tratamiento puede incluir: Medicamentos anticonvulsivos. Antidepresivos. Analgsicos fuertes. Un parche anestsico que se coloca sobre la piel. Inyecciones de: Anestsicos. Medicamentos para tratar la inflamacin. Toxina botulnica. Esta puede bloquear las seales de dolor e impedir que Stage manager. Siga estas indicaciones en su casa: Medicamentos Use los medicamentos de venta libre y los recetados solamente como se lo haya indicado el mdico. Pregntele al mdico si el  medicamento que le recet: Requiere que evite conducir o usar maquinaria. Puede causarle dificultad para defecar o estreimiento. Es posible que deba tomar  medidas para prevenir o tratar los problemas para defecar: Product manager suficiente lquido para Radio producer pis (orina) de color amarillo plido. Usar medicamentos recetados o de Sales promotion account executive. Consumir alimentos ricos en fibra, como frijoles, cereales integrales, y frutas y verduras frescas. Limitar el consumo de alimentos ricos en grasa y azcares procesados, como los alimentos fritos o dulces. Control del dolor  Si se lo indican, aplique hielo sobre la zona dolorida. Ponga el hielo en una bolsa plstica. Coloque una toalla entre la piel y la bolsa. Aplique el hielo durante 20 minutos, 2 o 3 veces por da. Si la piel se le pone de color rojo brillante, retire el hielo de inmediato para evitar daos en la piel. El [place] de dao es mayor si no puede sentir dolor, Airline pilot o fro. Cubra las zonas sensibles con una venda, o vendaje, para evitar que la ropa las roce. Use ropa holgada. Indicaciones generales Puede tardar mucho tiempo en mejorar. Trabaje en estrecha colaboracin con el mdico. Considere hablar con especialista de salud mental. Puede ayudarlo a encontrar formas de sobrellevar la sensacin de agobio o desesperanza. Tenga un buen sistema de Immunologist. Considere participar en un grupo de apoyo para Chief Technology Officer. Cmo se previene? Las vacunas son la mejor manera de prevenir la culebrilla y la NPH. Debe aplicarse la vacuna contra la culebrilla una vez que tenga ms de 50 aos. Hable con su mdico sobre la aplicacin de la vacuna. Comunquese con un mdico si: Los medicamentos no resultan eficaces. No puede controlar el dolor en su casa. Se siente triste o deprimido. Solicite ayuda de inmediato si: Tiene pensamientos acerca de lastimarse o lastimar a Economist. Busque ayuda de inmediato si alguna vez siente que puede hacerse dao a usted mismo o a otros, o tiene pensamientos de Patent examiner a su vida. Dirjase al centro de urgencias ms cercano o: Llame al 911. Llame a National Suicide  Prevention Lifeline (Lnea Telefnica Nacional para la Prevencin del Suicidio) al [phone] o al 988. Est disponible las 24 horas del da. Enve un mensaje de texto a la lnea para casos de crisis al [phone]. Esta informacin no tiene Theme park manager el consejo del mdico. Asegrese de hacerle al mdico cualquier pregunta que tenga. Document Revised: 03/14/2023 Document Reviewed: 03/14/2023 Elsevier Patient Education  2024 [employer].

## 2024-05-26 ENCOUNTER — Ambulatory Visit: Payer: Self-pay | Admitting: Physician Assistant

## 2024-05-26 DIAGNOSIS — Z8619 Personal history of other infectious and parasitic diseases: Secondary | ICD-10-CM | POA: Insufficient documentation

## 2024-05-26 DIAGNOSIS — D649 Anemia, unspecified: Secondary | ICD-10-CM

## 2024-05-26 LAB — VITAMIN D 25 HYDROXY (VIT D DEFICIENCY, FRACTURES): Vit D, 25-Hydroxy: 32.6 ng/mL (ref 30.0–100.0)

## 2024-05-26 LAB — POCT GLYCOSYLATED HEMOGLOBIN (HGB A1C): Hemoglobin A1C: 6.1 % — AB (ref 4.0–5.6)

## 2024-05-26 LAB — CBC WITH DIFFERENTIAL/PLATELET
Basophils Absolute: 0.1 x10E3/uL (ref 0.0–0.2)
Basos: 1 %
EOS (ABSOLUTE): 0.2 x10E3/uL (ref 0.0–0.4)
Eos: 2 %
Hematocrit: 34.4 % (ref 34.0–46.6)
Hemoglobin: 9.9 g/dL — ABNORMAL LOW (ref 11.1–15.9)
Immature Grans (Abs): 0 x10E3/uL (ref 0.0–0.1)
Immature Granulocytes: 0 %
Lymphocytes Absolute: 2.2 x10E3/uL (ref 0.7–3.1)
Lymphs: 32 %
MCH: 23.1 pg — ABNORMAL LOW (ref 26.6–33.0)
MCHC: 28.8 g/dL — ABNORMAL LOW (ref 31.5–35.7)
MCV: 80 fL (ref 79–97)
Monocytes Absolute: 0.6 x10E3/uL (ref 0.1–0.9)
Monocytes: 9 %
Neutrophils Absolute: 3.7 x10E3/uL (ref 1.4–7.0)
Neutrophils: 56 %
Platelets: 268 x10E3/uL (ref 150–450)
RBC: 4.29 x10E6/uL (ref 3.77–5.28)
RDW: 17.6 % — ABNORMAL HIGH (ref 11.7–15.4)
WBC: 6.7 x10E3/uL (ref 3.4–10.8)

## 2024-05-26 LAB — COMP. METABOLIC PANEL (12)
AST: 32 IU/L (ref 0–40)
Albumin: 4.2 g/dL (ref 3.8–4.8)
Alkaline Phosphatase: 114 IU/L (ref 44–121)
BUN/Creatinine Ratio: 31 — ABNORMAL HIGH (ref 12–28)
BUN: 17 mg/dL (ref 8–27)
Bilirubin Total: <0.2 mg/dL (ref 0.0–1.2)
Calcium: 9.3 mg/dL (ref 8.7–10.3)
Chloride: 103 mmol/L (ref 96–106)
Creatinine, Ser: 0.54 mg/dL — ABNORMAL LOW (ref 0.57–1.00)
Globulin, Total: 2.8 g/dL (ref 1.5–4.5)
Glucose: 109 mg/dL — ABNORMAL HIGH (ref 70–99)
Potassium: 4.6 mmol/L (ref 3.5–5.2)
Sodium: 139 mmol/L (ref 134–144)
Total Protein: 7 g/dL (ref 6.0–8.5)
eGFR: 95 mL/min/1.73 (ref >59)

## 2024-05-26 MED ORDER — ZOSTER VAC RECOMB ADJUVANTED 50 MCG/0.5ML IM SUSR: 0.5000 mL | Freq: Once | INTRAMUSCULAR | 0 refills | Status: AC

## 2024-05-26 NOTE — Progress Notes (Signed)
 Established Patient Office Visit  Subjective   Patient ID: Elizabeth Jennings, female    DOB: 1946-09-13  Age: 78 y.o. MRN: [id number]  Chief Complaint  Patient presents with   Numbness    Presents with facial numbness in area that was affected by shingles. No active breakout visible.     Hair/Scalp Problem    Presents scalp and eyes itching within the past months. Son has purchased head and shoulders shampoo with no relief.    Discussed the use of AI scribe software for clinical note transcription with the patient, who gave verbal consent to proceed.  History of Present Illness   Elizabeth Jennings is a 78 year old female who presents with persistent itching and numbness following a shingles infection.  Approximately a year and a half ago, she had a shingles infection on the left side of her face. Initially diagnosed as cellulitis, it was later confirmed as shingles and treated with prednisone , resolving the rash. However, she continues to experience persistent itching and numbness around her nose and face. She uses Vaseline for relief but has not been prescribed specific medication for the itching. She has not received the shingles vaccine.  She experiences itchiness in her ears and scalp, particularly at night, and previously used a medication for ear itchiness, which she has since run out of.  She has tried dandruff shampoo without relief of scalp itching.  She takes a daily blood pressure medication but does not have a home blood pressure monitor. She does not currently take any allergy  medication, although she previously used Zyrtec .  Son is present, helps with history and provides interpretation.  Patient declines clinic interpreter after being offered.     Past Medical History:  Diagnosis Date   Bell's palsy    Hemorrhoids    Hyperlipidemia    Hypertension    Osteoarthritis    right knee   Osteoporosis    Prediabetes    Social History   Socioeconomic History   Marital  status: Divorced    Spouse name: Not on file   Number of children: 2   Years of education: Not on file   Highest education level: Not on file  Occupational History   Not on file  Tobacco Use   Smoking status: Never    Passive exposure: Never   Smokeless tobacco: Never  Vaping Use   Vaping status: Never Used  Substance and Sexual Activity   Alcohol use: No   Drug use: No   Sexual activity: Not Currently    Birth control/protection: None  Other Topics Concern   Not on file  Social History Narrative      Right Handed   Drinks no caffeine   Social Drivers of Health   Financial Resource Strain: Low Risk  (01/16/2023)   Overall Financial Resource Strain (CARDIA)    Difficulty of Paying Living Expenses: Not hard at all  Food Insecurity: No Food Insecurity (01/16/2023)   Hunger Vital Sign    Worried About Running Out of Food in the Last Year: Never true    Ran Out of Food in the Last Year: Never true  Transportation Needs: No Transportation Needs (01/16/2023)   PRAPARE - Administrator, Civil Service (Medical): No    Lack of Transportation (Non-Medical): No  Physical Activity: Insufficiently Active (01/16/2023)   Exercise Vital Sign    Days of Exercise per Week: 3 days    Minutes of Exercise per Session: 20 min  Stress: No  Stress Concern Present (01/16/2023)   [employer] of Occupational Health - Occupational Stress Questionnaire    Feeling of Stress : Not at all  Social Connections: Moderately Integrated (01/16/2023)   Social Connection and Isolation Panel    Frequency of Communication with Friends and Family: More than three times a week    Frequency of Social Gatherings with Friends and Family: More than three times a week    Attends Religious Services: More than 4 times per year    Active Member of [employer] or Organizations: Yes    Attends Engineer, structural: More than 4 times per year    Marital Status: Divorced  Intimate Partner Violence: Not At  Risk (01/16/2023)   Humiliation, Afraid, Rape, and Kick questionnaire    Fear of Current or Ex-Partner: No    Emotionally Abused: No    Physically Abused: No    Sexually Abused: No   Family History  Problem Relation Age of Onset   Breast cancer Neg Hx    No Known Allergies  Review of Systems  Constitutional: Negative.   HENT: Negative.    Eyes: Negative.   Respiratory:  Negative for shortness of breath.   Cardiovascular:  Negative for chest pain.  Gastrointestinal: Negative.   Genitourinary: Negative.   Musculoskeletal: Negative.   Skin:  Positive for itching. Negative for rash.  Neurological: Negative.   Endo/Heme/Allergies: Negative.   Psychiatric/Behavioral: Negative.        Objective:     BP (!) 173/74 (BP Location: Left Arm, Patient Position: Sitting, Cuff Size: Large)   Pulse 67   Ht 4' 11 (1.499 m)   Wt 163 lb (73.9 kg)   SpO2 95%   BMI 32.92 kg/m  BP Readings from Last 3 Encounters:  05/25/24 (!) 173/74  10/22/23 (!) 140/78  06/10/23 139/65   Wt Readings from Last 3 Encounters:  05/25/24 163 lb (73.9 kg)  10/22/23 141 lb (64 kg)  06/10/23 149 lb 14.6 oz (68 kg)    Physical Exam Vitals and nursing note reviewed.  Constitutional:      General: She is not in acute distress.    Appearance: Normal appearance.  HENT:     Head: Normocephalic and atraumatic.     Right Ear: External ear normal.     Left Ear: External ear normal.     Nose: Nose normal.     Mouth/Throat:     Mouth: Mucous membranes are moist.     Pharynx: Oropharynx is clear.  Eyes:     Extraocular Movements: Extraocular movements intact.     Conjunctiva/sclera: Conjunctivae normal.     Pupils: Pupils are equal, round, and reactive to light.  Cardiovascular:     Rate and Rhythm: Normal rate and regular rhythm.     Pulses: Normal pulses.     Heart sounds: Normal heart sounds.  Pulmonary:     Effort: Pulmonary effort is normal.     Breath sounds: Normal breath sounds.   Musculoskeletal:        General: Normal range of motion.     Cervical back: Normal range of motion and neck supple.  Skin:    General: Skin is warm and dry.     Findings: No erythema or rash.  Neurological:     General: No focal deficit present.     Mental Status: She is alert and oriented to person, place, and time.  Psychiatric:        Mood and Affect: Mood normal.  Behavior: Behavior normal.        Thought Content: Thought content normal.        Judgment: Judgment normal.      Assessment & Plan:   Problem List Items Addressed This Visit       Cardiovascular and Mediastinum   Essential hypertension - Primary   Relevant Orders   CBC with Differential/Platelet (Completed)   Comp. Metabolic Panel (12) (Completed)     Other   Environmental allergies   Relevant Medications   cetirizine  (ZYRTEC  ALLERGY ) 10 MG tablet   Prediabetes   Vitamin D  deficiency   Relevant Orders   Vitamin D , 25-hydroxy (Completed)   Other Visit Diagnoses       Need for shingles vaccine       Relevant Orders   Zoster Recombinant (Shingrix )     Elevated blood pressure reading in office with diagnosis of hypertension         Anemia, unspecified type         History of shingles       Relevant Medications   gabapentin  (NEURONTIN ) 100 MG capsule      1. Essential hypertension (Primary) Blood pressure elevated in clinic today.  Encouraged to check blood pressure at home, keep a written log and have available for all office visits.  Patient to return to mobile unit in 3 weeks for follow-up.  Red flags given for prompt reevaluation. - CBC with Differential/Platelet - Comp. Metabolic Panel (12)  2. History of shingles Persistent itching and numbness due to possible nerve damage from shingles. - Prescribe gabapentin  twice daily. - Schedule follow-up in 2-3 weeks to assess gabapentin  effectiveness.Trial gabapentin .  Patient education given on supportive care - gabapentin  (NEURONTIN ) 100 MG  capsule; Take 1 capsule (100 mg total) by mouth 2 (two) times daily.  Dispense: 60 capsule; Refill: 1  3. Elevated blood pressure reading in office with diagnosis of hypertension   4. Prediabetes A1C 6.1  5. Anemia, unspecified type   6. Vitamin D  deficiency  - Vitamin D , 25-hydroxy  7. Environmental allergies Continue current regimen - cetirizine  (ZYRTEC  ALLERGY ) 10 MG tablet; Take 1 tablet (10 mg total) by mouth daily.  Dispense: 90 tablet; Refill: 3  8. Need for shingles vaccine  - Zoster Recombinant (Shingrix )   I have reviewed the patient's medical history (PMH, PSH, Social History, Family History, Medications, and allergies) , and have been updated if relevant. I spent 30 minutes reviewing chart and  face to face time with patient.    Return in about 3 weeks (around 06/15/2024) for With [facility].    Kirk RAMAN Mayers, PA-C

## 2024-05-26 NOTE — Addendum Note (Signed)
 Addended by: AISHA NORRIS L on: 05/26/2024 04:18 PM   Modules accepted: Orders

## 2024-06-03 ENCOUNTER — Ambulatory Visit

## 2024-06-03 ENCOUNTER — Other Ambulatory Visit: Payer: Self-pay | Admitting: Physician Assistant

## 2024-06-03 DIAGNOSIS — D649 Anemia, unspecified: Secondary | ICD-10-CM

## 2024-06-03 DIAGNOSIS — H6122 Impacted cerumen, left ear: Secondary | ICD-10-CM

## 2024-06-03 MED ORDER — NEOMYCIN-POLYMYXIN-HC 3.5-10000-1 OT SUSP: 3.0000 [drp] | Freq: Three times a day (TID) | OTIC | 0 refills | Status: DC

## 2024-06-05 LAB — IRON,TIBC AND FERRITIN PANEL
Ferritin: 18 ng/mL (ref 15–150)
Iron Saturation: 4 % — CL (ref 15–55)
Iron: 18 ug/dL — ABNORMAL LOW (ref 27–139)
Total Iron Binding Capacity: 472 ug/dL — ABNORMAL HIGH (ref 250–450)
UIBC: 454 ug/dL — ABNORMAL HIGH (ref 118–369)

## 2024-06-07 ENCOUNTER — Ambulatory Visit: Payer: Self-pay | Admitting: Physician Assistant

## 2024-06-07 DIAGNOSIS — D509 Iron deficiency anemia, unspecified: Secondary | ICD-10-CM

## 2024-06-07 MED ORDER — IRON (FERROUS SULFATE) 325 (65 FE) MG PO TABS: 325.0000 mg | ORAL_TABLET | ORAL | 1 refills | Status: DC

## 2024-06-09 ENCOUNTER — Other Ambulatory Visit: Payer: Self-pay | Admitting: Family Medicine

## 2024-06-09 DIAGNOSIS — I10 Essential (primary) hypertension: Secondary | ICD-10-CM

## 2024-06-09 MED ORDER — LOSARTAN POTASSIUM 50 MG PO TABS: ORAL_TABLET | ORAL | 1 refills | Status: DC

## 2024-06-09 NOTE — Telephone Encounter (Signed)
 Copied from CRM [id number]. Topic: Clinical - Medication Refill >> Jun 09, 2024  9:44 AM Delon T wrote: Medication: losartan  (COZAAR ) 50 MG tablet  Has the patient contacted their pharmacy? Yes (Agent: If no, request that the patient contact the pharmacy for the refill. If patient does not wish to contact the pharmacy document the reason why and proceed with request.) (Agent: If yes, when and what did the pharmacy advise?)  This is the patient's preferred pharmacy:    [facility] [address] [place], [place] [address] Phone: [phone]  Is this the correct pharmacy for this prescription? Yes If no, delete pharmacy and type the correct one.   Has the prescription been filled recently? Yes  Is the patient out of the medication? Yes  Has the patient been seen for an appointment in the last year OR does the patient have an upcoming appointment? Yes  Can we respond through MyChart? No  Agent: Please be advised that Rx refills may take up to 3 business days. We ask that you follow-up with your pharmacy.

## 2024-06-14 ENCOUNTER — Other Ambulatory Visit: Payer: Self-pay | Admitting: Family Medicine

## 2024-06-14 ENCOUNTER — Telehealth: Payer: Self-pay | Admitting: Family Medicine

## 2024-06-14 DIAGNOSIS — I10 Essential (primary) hypertension: Secondary | ICD-10-CM

## 2024-06-14 NOTE — Telephone Encounter (Signed)
 Contacted pt to schedule follow up visit with [facility]. No answer/LVM.

## 2024-06-14 NOTE — Telephone Encounter (Unsigned)
 Copied from CRM [id number]. Topic: Clinical - Medication Refill >> Jun 14, 2024  4:25 PM Tiffany S wrote: Medication: losartan  (COZAAR ) 50 MG tablet [[id number]]  Has the patient contacted their pharmacy? Yes (Agent: If no, request that the patient contact the pharmacy for the refill. If patient does not wish to contact the pharmacy document the reason why and proceed with request.) (Agent: If yes, when and what did the pharmacy advise?)  This is the patient's preferred pharmacy:  [id number] [place], [place] - [address] AT [facility] & [address] [place] [address] [place] [address] Phone: [phone] Fax: [phone]   Is this the correct pharmacy for this prescription? Yes If no, delete pharmacy and type the correct one.   Has the prescription been filled recently? Yes  Is the patient out of the medication? Yes  Has the patient been seen for an appointment in the last year OR does the patient have an upcoming appointment? Yes  Can we respond through MyChart? Yes  Agent: Please be advised that Rx refills may take up to 3 business days. We ask that you follow-up with your pharmacy.

## 2024-06-14 NOTE — Telephone Encounter (Unsigned)
 Copied from CRM [id number]. Topic: Clinical - Medication Refill >> Jun 14, 2024  4:58 PM Bridgette M wrote: Medication: losartan  (COZAAR ) 50 MG tablet [[id number]]  Has the patient contacted their pharmacy? Yes (Agent: If no, request that the patient contact the pharmacy for the refill. If patient does not wish to contact the pharmacy document the reason why and proceed with request.) (Agent: If yes, when and what did the pharmacy advise?)  This is the patient's preferred pharmacy:  [id number] [place], [place] - [address] AT [facility] & [address] [place] [address] Phone: [phone] Fax: [phone]   Is this the correct pharmacy for this prescription? Yes If no, delete pharmacy and type the correct one.   Has the prescription been filled recently? Yes  Is the patient out of the medication? Yes  Has the patient been seen for an appointment in the last year OR does the patient have an upcoming appointment? Yes  Can we respond through MyChart? Yes  Agent: Please be advised that Rx refills may take up to 3 business days. We ask that you follow-up with your pharmacy.

## 2024-06-17 ENCOUNTER — Ambulatory Visit
Admission: RE | Admit: 2024-06-17 | Discharge: 2024-06-17 | Disposition: A | Discharge status: Home / Self Care | Source: Ambulatory Visit | Attending: Nurse Practitioner | Admitting: Nurse Practitioner

## 2024-06-17 DIAGNOSIS — Z1231 Encounter for screening mammogram for malignant neoplasm of breast: Secondary | ICD-10-CM

## 2024-06-21 ENCOUNTER — Ambulatory Visit: Payer: Self-pay | Admitting: Nurse Practitioner

## 2024-08-03 ENCOUNTER — Encounter: Payer: Self-pay | Admitting: Nurse Practitioner

## 2024-08-03 ENCOUNTER — Ambulatory Visit (INDEPENDENT_AMBULATORY_CARE_PROVIDER_SITE_OTHER): Payer: Self-pay | Admitting: Nurse Practitioner

## 2024-08-03 VITALS — BP 121/64 | HR 74 | Temp 97.5°F | Wt 161.0 lb

## 2024-08-03 DIAGNOSIS — L299 Pruritus, unspecified: Secondary | ICD-10-CM

## 2024-08-03 DIAGNOSIS — M1712 Unilateral primary osteoarthritis, left knee: Secondary | ICD-10-CM | POA: Diagnosis not present

## 2024-08-03 DIAGNOSIS — I10 Essential (primary) hypertension: Secondary | ICD-10-CM

## 2024-08-03 DIAGNOSIS — L219 Seborrheic dermatitis, unspecified: Secondary | ICD-10-CM | POA: Diagnosis not present

## 2024-08-03 DIAGNOSIS — D509 Iron deficiency anemia, unspecified: Secondary | ICD-10-CM | POA: Diagnosis not present

## 2024-08-03 DIAGNOSIS — Z23 Encounter for immunization: Secondary | ICD-10-CM

## 2024-08-03 MED ORDER — KETOCONAZOLE 2 % EX SHAM: 1.0000 | MEDICATED_SHAMPOO | CUTANEOUS | 0 refills | Status: AC

## 2024-08-03 MED ORDER — IRON (FERROUS SULFATE) 325 (65 FE) MG PO TABS: 325.0000 mg | ORAL_TABLET | ORAL | 1 refills | Status: DC

## 2024-08-03 NOTE — Assessment & Plan Note (Signed)
 Lab Results  Component Value Date   WBC 6.7 05/25/2024   HGB 9.9 (L) 05/25/2024   HCT 34.4 05/25/2024   MCV 80 05/25/2024   PLT 268 05/25/2024    Iron /TIBC/Ferritin/ %Sat    Component Value Date/Time   IRON  18 (L) 06/03/2024 1812   TIBC 472 (H) 06/03/2024 1812   FERRITIN 18 06/03/2024 1812   IRONPCTSAT 4 (LL) 06/03/2024 1812    Iron  deficiency anemia with low blood count. Currently taking iron  with vitamin C to improve absorption. - Order CBC and iron  panel next week to recheck blood counts.

## 2024-08-03 NOTE — Assessment & Plan Note (Signed)
 Patient educated on CDC recommendation for the vaccine. Verbal consent was obtained from the patient, vaccine administered by nurse, no sign of adverse reactions noted at this time. Patient education on arm soreness and use of tylenol  for this patient  was discussed. Patient educated on the signs and symptoms of adverse effect and advise to contact the office if they occur.  ?

## 2024-08-03 NOTE — Assessment & Plan Note (Signed)
   Left knee pain Left knee pain with planned surgery. Current issues with the left knee may require surgery after bladder surgery. - Plan for left knee surgery following bladder surgery. Continue Tylenol  as needed

## 2024-08-03 NOTE — Progress Notes (Signed)
 New Patient Office Visit  Subjective:  Patient ID: Elizabeth Jennings, female    DOB: 24-Sep-1946  Age: 78 y.o. MRN: [id number]  CC:  Chief Complaint  Patient presents with   Hypertension   Establish Care   Hair/Scalp Problem    Use to  have shampoo. Need a refill itching at HS    HPI  Discussed the use of AI scribe software for clinical note transcription with the patient, who gave verbal consent to proceed.  History of Present Illness Elizabeth Jennings is a 78 year old female   has a past medical history of Bell's palsy, Hemorrhoids, Hyperlipidemia, Hypertension, Osteoarthritis, Osteoporosis, and Prediabetes.  Patient presents for follow-up for her chronic medical conditions. Interpretation services provided by a medical interpreter   She has a history of right knee surgery last year and is currently experiencing issues with her left knee, which may require surgery in a few months. She uses Tylenol  for knee pain and reports no concerning symptoms.  She is scheduled for bladder surgery in the near future. Details regarding the specific condition necessitating the surgery were not provided.  She reports taking losartan  50 mg daily for blood pressure, and her most recent reading was 121/64 mmHg.   She also takes iron  with vitamin C for low blood count, as well as a vitamin for bones and vision prescribed by her eye doctor.  She denies abnormal bleeding  She had shingles 2-3 years ago, which resulted in numbness and itching. The numbness has resolved, but she continues to experience itching on her head and ear. She was previously prescribed gabapentin  for these symptoms. She received her first dose of the shingles vaccine at a pharmacy and is awaiting the second dose.  She lives in an apartment and prefers to schedule appointments in person rather than over the phone.   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   Procedure: RIGHT TOTAL KNEE ARTHROPLASTY;  Surgeon: Jerri Kay HERO, MD;  Location: [facility] OR;  Service: Orthopedics;  Laterality: Right;    Family History  Problem Relation Age of Onset   Breast cancer Neg Hx    BRCA 1/2 Neg Hx     Social History   Socioeconomic History   Marital status: Divorced    Spouse name: Not on file   Number of children: 2   Years of education: Not on file   Highest education level: Not on file  Occupational History   Not on file  Tobacco Use   Smoking status: Never    Passive exposure: Never   Smokeless tobacco: Never  Vaping Use   Vaping status: Never Used  Substance and Sexual Activity   Alcohol use: No   Drug use: No   Sexual activity: Not Currently    Birth control/protection: None  Other Topics Concern   Not on file  Social History Narrative      Right Handed   Drinks no caffeine   Social Drivers of Health   Financial Resource Strain: Low Risk  (01/16/2023)  Overall Financial Resource Strain (CARDIA)    Difficulty of Paying Living Expenses: Not hard at all  Food Insecurity: No Food Insecurity (01/16/2023)   Hunger Vital Sign    Worried About Running Out of Food in the Last Year: Never true    Ran Out of Food in the Last Year: Never true  Transportation Needs: No Transportation Needs (01/16/2023)   PRAPARE - Administrator, Civil Service (Medical): No    Lack of Transportation (Non-Medical): No  Physical Activity: Insufficiently Active (01/16/2023)   Exercise Vital Sign    Days of Exercise per Week: 3 days    Minutes of Exercise per Session: 20 min  Stress: No Stress Concern Present (01/16/2023)   [employer] of Occupational Health - Occupational Stress  Questionnaire    Feeling of Stress : Not at all  Social Connections: Moderately Integrated (01/16/2023)   Social Connection and Isolation Panel    Frequency of Communication with Friends and Family: More than three times a week    Frequency of Social Gatherings with Friends and Family: More than three times a week    Attends Religious Services: More than 4 times per year    Active Member of [employer] or Organizations: Yes    Attends Engineer, structural: More than 4 times per year    Marital Status: Divorced  Intimate Partner Violence: Not At Risk (01/16/2023)   Humiliation, Afraid, Rape, and Kick questionnaire    Fear of Current or Ex-Partner: No    Emotionally Abused: No    Physically Abused: No    Sexually Abused: No    ROS Review of Systems  Constitutional:  Negative for appetite change, chills, fatigue and fever.  HENT:  Negative for congestion, postnasal drip, rhinorrhea and sneezing.   Respiratory:  Negative for cough, shortness of breath and wheezing.   Cardiovascular:  Negative for chest pain, palpitations and leg swelling.  Gastrointestinal:  Negative for abdominal pain, constipation, nausea and vomiting.  Genitourinary:  Negative for difficulty urinating, dysuria, flank pain and frequency.  Musculoskeletal:  Positive for arthralgias. Negative for joint swelling and myalgias.  Skin:  Negative for color change, pallor, rash and wound.  Neurological:  Negative for dizziness, facial asymmetry, weakness, numbness and headaches.  Psychiatric/Behavioral:  Negative for behavioral problems, confusion, self-injury and suicidal ideas.     Objective:   Today's Vitals: BP 121/64   Pulse 74   Temp (!) 97.5 F (36.4 C)   Wt 161 lb (73 kg)   SpO2 93%   BMI 32.52 kg/m   Physical Exam Vitals and nursing note reviewed.  Constitutional:      General: She is not in acute distress.    Appearance: Normal appearance. She is obese. She is not ill-appearing, toxic-appearing or  diaphoretic.  HENT:     Right Ear: Tympanic membrane, ear canal and external ear normal. There is no impacted cerumen.     Left Ear: Tympanic membrane, ear canal and external ear normal. There is no impacted cerumen.  Eyes:     General: No scleral icterus.       Right eye: No discharge.        Left eye: No discharge.     Extraocular Movements: Extraocular movements intact.     Conjunctiva/sclera: Conjunctivae normal.  Cardiovascular:     Rate and Rhythm: Normal rate and regular rhythm.     Pulses: Normal pulses.     Heart sounds: Normal heart sounds. No murmur heard.  No friction rub. No gallop.  Pulmonary:     Effort: Pulmonary effort is normal. No respiratory distress.     Breath sounds: Normal breath sounds. No stridor. No wheezing, rhonchi or rales.  Chest:     Chest wall: No tenderness.  Abdominal:     General: There is no distension.     Palpations: Abdomen is soft.     Tenderness: There is no abdominal tenderness. There is no right CVA tenderness, left CVA tenderness or guarding.  Musculoskeletal:        General: No swelling, tenderness or signs of injury.     Right lower leg: No edema.     Left lower leg: No edema.  Skin:    General: Skin is warm and dry.     Capillary Refill: Capillary refill takes less than 2 seconds.     Coloration: Skin is not jaundiced or pale.     Findings: No bruising, erythema or lesion.  Neurological:     Mental Status: She is alert and oriented to person, place, and time.     Motor: No weakness.     Gait: Gait abnormal.  Psychiatric:        Mood and Affect: Mood normal.        Behavior: Behavior normal.        Thought Content: Thought content normal.        Judgment: Judgment normal.     Assessment & Plan:   Problem List Items Addressed This Visit       Cardiovascular and Mediastinum   Essential hypertension   BP Readings from Last 3 Encounters:  08/03/24 121/64  05/25/24 (!) 173/74  10/22/23 (!) 140/78   Blood pressure is  well-controlled with current medication regimen. - Continue losartan  50 mg daily.        Musculoskeletal and Integument   Primary osteoarthritis of left knee     Left knee pain Left knee pain with planned surgery. Current issues with the left knee may require surgery after bladder surgery. - Plan for left knee surgery following bladder surgery. Continue Tylenol  as needed       Seborrheic dermatitis    - ketoconazole  (NIZORAL ) 2 % shampoo; Apply 1 Application topically 2 (two) times a week.  Apply twice weekly for 2 to 4 weeks      Relevant Medications   ketoconazole  (NIZORAL ) 2 % shampoo (Start on 08/05/2024)     Other   Itching of ear   Bilateral ear appears normal upon examination today no redness swelling or excess wax noted, tympanic membranes normal      Need for influenza vaccination   Patient educated on CDC recommendation for the vaccine. Verbal consent was obtained from the patient, vaccine administered by nurse, no sign of adverse reactions noted at this time. Patient education on arm soreness and use of tylenol  for this patient was discussed. Patient educated on the signs and symptoms of adverse effect and advise to contact the office if they occur.       Relevant Orders   Flu vaccine HIGH DOSE PF(Fluzone Trivalent) (Completed)   Iron  deficiency anemia - Primary   Lab Results  Component Value Date   WBC 6.7 05/25/2024   HGB 9.9 (L) 05/25/2024   HCT 34.4 05/25/2024   MCV 80 05/25/2024   PLT 268 05/25/2024    Iron /TIBC/Ferritin/ %Sat    Component Value Date/Time   IRON  18 (L) 06/03/2024 1812   TIBC 472 (H) 06/03/2024 1812  FERRITIN 18 06/03/2024 1812   IRONPCTSAT 4 (LL) 06/03/2024 1812    Iron  deficiency anemia with low blood count. Currently taking iron  with vitamin C to improve absorption. - Order CBC and iron  panel next week to recheck blood counts.      Relevant Medications   Iron , Ferrous Sulfate , 325 (65 Fe) MG TABS   Other Relevant Orders    CBC   Iron , TIBC and Ferritin Panel    Outpatient Encounter Medications as of 08/03/2024  Medication Sig   ascorbic acid (VITAMIN C) 500 MG tablet Take 500 mg by mouth daily.   carboxymethylcellulose (REFRESH PLUS) 0.5 % SOLN 1 drop 3 (three) times daily as needed.   cetirizine  (ZYRTEC  ALLERGY ) 10 MG tablet Take 1 tablet (10 mg total) by mouth daily.   [START ON 08/05/2024] ketoconazole  (NIZORAL ) 2 % shampoo Apply 1 Application topically 2 (two) times a week.   losartan  (COZAAR ) 50 MG tablet TAKE 1 TABLET(50 MG) BY MOUTH DAILY   Multiple Vitamin (MULTIVITAMIN) capsule Take 1 capsule by mouth daily.   [DISCONTINUED] Iron , Ferrous Sulfate , 325 (65 Fe) MG TABS Take 325 mg by mouth every other day.   fluticasone  (FLONASE ) 50 MCG/ACT nasal spray Place 2 sprays into both nostrils daily. (Patient not taking: Reported on 05/25/2024)   Iron , Ferrous Sulfate , 325 (65 Fe) MG TABS Take 325 mg by mouth every other day.   methocarbamol  (ROBAXIN -750) 750 MG tablet Take 1 tablet (750 mg total) by mouth 2 (two) times daily as needed for muscle spasms. (Patient not taking: Reported on 08/03/2024)   ondansetron  (ZOFRAN ) 4 MG tablet Take 1 tablet (4 mg total) by mouth every 8 (eight) hours as needed for nausea or vomiting. (Patient not taking: Reported on 08/03/2024)   [DISCONTINUED] gabapentin  (NEURONTIN ) 100 MG capsule Take 1 capsule (100 mg total) by mouth 2 (two) times daily. (Patient not taking: Reported on 08/03/2024)   [DISCONTINUED] HYDROcodone -acetaminophen  (NORCO) 5-325 MG tablet Take 1 tablet by mouth 3 (three) times daily as needed for moderate pain. (Patient not taking: Reported on 08/03/2024)   [DISCONTINUED] neomycin -polymyxin-hydrocortisone  (CORTISPORIN) 3.5-10000-1 OTIC suspension Place 3 drops into the left ear 3 (three) times daily. (Patient not taking: Reported on 08/03/2024)   [DISCONTINUED] oxyCODONE -acetaminophen  (PERCOCET) 5-325 MG tablet Take 1-2 tablets by mouth every 6 (six) hours as needed. To be  taken after surgery (Patient not taking: Reported on 08/03/2024)   [DISCONTINUED] Vitamin D , Ergocalciferol , (DRISDOL ) 1.25 MG (50000 UNIT) CAPS capsule TAKE 1 CAPSULE BY MOUTH EVERY 7 DAYS (Patient not taking: Reported on 08/03/2024)   No facility-administered encounter medications on file as of 08/03/2024.    Follow-up: Return in about 3 months (around 11/02/2024) for HTN anemia.   Elizabeth Mikus R Avanthika Dehnert, FNP

## 2024-08-03 NOTE — Assessment & Plan Note (Signed)
 BP Readings from Last 3 Encounters:  08/03/24 121/64  05/25/24 (!) 173/74  10/22/23 (!) 140/78   Blood pressure is well-controlled with current medication regimen. - Continue losartan  50 mg daily.

## 2024-08-03 NOTE — Assessment & Plan Note (Signed)
-   ketoconazole  (NIZORAL ) 2 % shampoo; Apply 1 Application topically 2 (two) times a week.  Apply twice weekly for 2 to 4 weeks

## 2024-08-03 NOTE — Patient Instructions (Addendum)
  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 Dispensacin: 30 comprimidos; Resurtido: 1 - Hemograma completo; Futuro - Perfil de hierro, TIBC y ferritina; Futuro  . Necesidad de vacunacin contra la influenza  - Vacuna antigripal de alta dosis PF (Fluzone Trivalente)  . Dermatitis seborreica  - Ketoconazol (NIZORAL ) champ al 2 %; Aplicar 1 aplicacin tpica 2 (dos) veces por semana. Dispensacin: 120 ml; Resurtido: 0 ; Aplicar 1 aplicacin tpica 2 (dos) veces por semana. : Aplicar de 5 a 10 ml sobre el cuero cabelludo hmedo, hacer espuma, dejar actuar de 3 a 5 minutos y Medical illustrator; Stage manager veces por semana durante 2 a 4 semanas        Iron  deficiency anemia, unspecified iron  deficiency anemia type  - Iron , Ferrous Sulfate , 325 (65 Fe) MG TABS; Take 325 mg by mouth every other day.  Dispense: 30 tablet; Refill: 1 - CBC; Future - Iron , TIBC and Ferritin Panel; Future  . Need for influenza vaccination  - Flu vaccine HIGH DOSE PF(Fluzone Trivalent)  . Seborrheic dermatitis  - ketoconazole  (NIZORAL ) 2 % shampoo; Apply 1 Application topically 2 (two) times a week.  Dispense: 120 mL; Refill: 0  ; Apply 1 Application topically 2 (two) times a week.  : Apply 5 to 10 mL to wet scalp, lather, leave on 3 to 5 minutes, and rinse; apply twice weekly for 2 to 4 weeks      It is important that you exercise regularly at least 30 minutes 5 times a week as tolerated  Think about what you will eat, plan ahead. Choose  clean, green, fresh or frozen over canned, processed or packaged foods which are more sugary, salty and fatty. 70 to 75% of food eaten should be vegetables and fruit. Three meals at set times with snacks allowed between meals, but they must be fruit or vegetables. Aim to eat over a 12 hour period , example 7 am to 7 pm, and STOP after  your last meal of the day. Drink  water,generally about 64 ounces per day, no other drink is as healthy. Fruit juice is best enjoyed in a healthy way, by EATING the fruit.  Thanks for choosing [facility] we consider it a privelige to serve you.

## 2024-08-03 NOTE — Progress Notes (Signed)
 Elizabeth Jennings

## 2024-08-03 NOTE — Assessment & Plan Note (Signed)
 Bilateral ear appears normal upon examination today no redness swelling or excess wax noted, tympanic membranes normal

## 2024-08-10 ENCOUNTER — Other Ambulatory Visit: Payer: Self-pay

## 2024-08-10 DIAGNOSIS — D509 Iron deficiency anemia, unspecified: Secondary | ICD-10-CM

## 2024-08-11 ENCOUNTER — Other Ambulatory Visit: Payer: Self-pay | Admitting: Nurse Practitioner

## 2024-08-11 ENCOUNTER — Ambulatory Visit: Payer: Self-pay | Admitting: Nurse Practitioner

## 2024-08-11 DIAGNOSIS — D509 Iron deficiency anemia, unspecified: Secondary | ICD-10-CM

## 2024-08-11 LAB — CBC
Hematocrit: 34.1 % (ref 34.0–46.6)
Hemoglobin: 10.1 g/dL — ABNORMAL LOW (ref 11.1–15.9)
MCH: 23.7 pg — ABNORMAL LOW (ref 26.6–33.0)
MCHC: 29.6 g/dL — ABNORMAL LOW (ref 31.5–35.7)
MCV: 80 fL (ref 79–97)
Platelets: 248 x10E3/uL (ref 150–450)
RBC: 4.26 x10E6/uL (ref 3.77–5.28)
RDW: 19.4 % — ABNORMAL HIGH (ref 11.7–15.4)
WBC: 5.5 x10E3/uL (ref 3.4–10.8)

## 2024-08-11 LAB — IRON,TIBC AND FERRITIN PANEL
Ferritin: 14 ng/mL — ABNORMAL LOW (ref 15–150)
Iron Saturation: 7 % — CL (ref 15–55)
Iron: 33 ug/dL (ref 27–139)
Total Iron Binding Capacity: 442 ug/dL (ref 250–450)
UIBC: 409 ug/dL — ABNORMAL HIGH (ref 118–369)

## 2024-08-11 MED ORDER — IRON (FERROUS SULFATE) 325 (65 FE) MG PO TABS: 325.0000 mg | ORAL_TABLET | ORAL | 1 refills | Status: DC

## 2024-09-22 ENCOUNTER — Other Ambulatory Visit: Payer: Self-pay | Admitting: Urology

## 2024-09-26 ENCOUNTER — Telehealth: Payer: Self-pay | Admitting: *Deleted

## 2024-09-26 NOTE — Telephone Encounter (Signed)
(  Late entry for 06/10/23)- 7 day phone call completed.

## 2024-11-02 ENCOUNTER — Ambulatory Visit: Payer: Self-pay | Admitting: Nurse Practitioner

## 2024-11-09 NOTE — Patient Instructions (Addendum)
 SURGICAL WAITING ROOM VISITATION  Patients having surgery or a procedure may have no more than 2 support people in the waiting area - these visitors may rotate.    Children ages 30 and under will not be able to visit patients in [facility] under most circumstances.   Visitors with respiratory illnesses are discouraged from visiting and should remain at home.  If the patient needs to stay at the hospital during part of their recovery, the visitor guidelines for inpatient rooms apply. Pre-op nurse will coordinate an appropriate time for 1 support person to accompany patient in pre-op.  This support person may not rotate.    Please refer to the [facility] website for the visitor guidelines for Inpatients (after your surgery is over and you are in a regular room).    Your procedure is scheduled on: 11-17-24   Report to [facility] Main Entrance    Report to admitting at 9:45 AM   Call this number if you have problems the morning of surgery [phone]     FOLLOW A CLEAR LIQUID DIET THE DAY BEFORE YOUR SURGERY .NOTHING BY MOUTH AFTER MIDNIGHT THE NIGHT BEFORE SURGERY EXCEPT SIPS OF WATER WITH MEDS                                     CLEAR LIQUID DIET   Water Non-Citrus Juices (without pulp, NO RED-Apple, White grape, White cranberry) Black Coffee (NO MILK/CREAM OR CREAMERS, sugar ok)  Clear Tea (NO MILK/CREAM OR CREAMERS, sugar ok) regular and decaf                             Plain Jell-O (NO RED)                                           Fruit ices (not with fruit pulp, NO RED)                                     Popsicles (NO RED)                                                               Sports drinks like Gatorade (NO RED           If you have questions, please contact your surgeons office.  ONE FLEETS ENEMA THE NIGHT BEFORE SURGERY  FOLLOW BOWEL PREP AND ANY ADDITIONAL PRE OP INSTRUCTIONS YOU RECEIVED FROM YOUR SURGEON'S OFFICE!!!     Oral Hygiene  is also important to reduce your risk of infection.                                    Remember - BRUSH YOUR TEETH THE MORNING OF SURGERY WITH YOUR REGULAR TOOTHPASTE  DENTURES WILL BE REMOVED PRIOR TO SURGERY PLEASE DO NOT APPLY Poly grip OR ADHESIVES!!!   Stop all vitamins and herbal supplements 7  days before surgery.   Take these medicines the morning of surgery with A SIP OF WATER:                               You may not have any metal on your body including hair pins, jewelry, and body piercing             Do not wear make-up, lotions, powders, perfumes, or deodorant  Do not wear nail polish including gel and S&S, artificial/acrylic nails, or any other type of covering on natural nails including finger and toenails. If you have artificial nails, gel coating, etc. that needs to be removed by a nail salon please have this removed prior to surgery or surgery may need to be canceled/ delayed if the surgeon/ anesthesia feels like they are unable to be safely monitored.   Do not shave  48 hours prior to surgery.    Do not bring valuables to the hospital. [facility] IS NOT             RESPONSIBLE   FOR VALUABLES.   Contacts, glasses, dentures or bridgework may not be worn into surgery.   Bring small overnight bag day of surgery.   DO NOT BRING YOUR HOME MEDICATIONS TO THE HOSPITAL. PHARMACY WILL DISPENSE MEDICATIONS LISTED ON YOUR MEDICATION LIST TO YOU DURING YOUR ADMISSION IN THE HOSPITAL!    Patients discharged on the day of surgery will not be allowed to drive home.  Someone NEEDS to stay with you for the first 24 hours after anesthesia.   Special Instructions: Bring a copy of your healthcare power of attorney and living will documents the day of surgery if you haven't scanned them before.              Please read over the following fact sheets you were given: IF YOU HAVE QUESTIONS ABOUT YOUR PRE-OP INSTRUCTIONS PLEASE CALL [phone].   . If you test positive for Covid or have  been in contact with anyone that has tested positive in the last 10 days please notify you surgeon.    [facility] - Preparing for Surgery Before surgery, you can play an important role.  Because skin is not sterile, your skin needs to be as free of germs as possible.  You can reduce the number of germs on your skin by washing with CHG (chlorahexidine gluconate) soap before surgery.  CHG is an antiseptic cleaner which kills germs and bonds with the skin to continue killing germs even after washing. Please DO NOT use if you have an allergy  to CHG or antibacterial soaps.  If your skin becomes reddened/irritated stop using the CHG and inform your nurse when you arrive at Short Stay. Do not shave (including legs and underarms) for at least 48 hours prior to the first CHG shower.  You may shave your face/neck.  Please follow these instructions carefully:  1.  Shower with CHG Soap the night before surgery ONLY (DO NOT USE THE SOAP THE MORNING OF SURGERY).  2.  If you choose to wash your hair, wash your hair first as usual with your normal  shampoo.  3.  After you shampoo, rinse your hair and body thoroughly to remove the shampoo.                             4.  Use CHG as you would  any other liquid soap.  You can apply chg directly to the skin and wash.  Gently with a scrungie or clean washcloth.  5.  Apply the CHG Soap to your body ONLY FROM THE NECK DOWN.   Do  not use on face/ open                           Wound or open sores. Avoid contact with eyes, ears mouth and genitals (private parts).                       Wash face,  Genitals (private parts) with your normal soap.             6.  Wash thoroughly, paying special attention to the area where your  surgery  will be performed.  7.  Thoroughly rinse your body with warm water from the neck down.  8.  DO NOT shower/wash with your normal soap after using and rinsing off the CHG Soap.                9.  Pat yourself dry with a clean towel.             10.  Wear clean pajamas.            11.  Place clean sheets on your bed the night of your first shower and do not  sleep with pets. Day of Surgery : Do not apply any CHG, lotions/deodorants the morning of surgery.  Please wear clean clothes to the hospital/surgery center.  FAILURE TO FOLLOW THESE INSTRUCTIONS MAY RESULT IN THE CANCELLATION OF YOUR SURGERY  PATIENT SIGNATURE_________________________________  NURSE SIGNATURE__________________________________  ________________________________________________________________________  DEBIDO AL COVID-19 SLO SE PERMITEN DOS VISITANTES (de 16 aos en adelante)  PARA QUE VENGAN CON USTED Y SE QUEDEN EN LA SALA DE ESPERA SOLAMENTE DURANTE EL PRE OP Y EL PROCEDIMIENTO.   **NO SE PERMITEN VISITAS EN EL REA DE CORTA ESTADA NI EN LA SALA DE RECUPERACIN!!**  SI VA A SER INGRESADO(A) AL HOSPITAL SLO SE LE PERMITEN CUATRO PERSONAS DE APOYO DURANTE LAS HORAS DE VISITA (7 AM -8PM)   La(s) persona(s) de apoyo debe(n) pasar nuestra evaluacin, entrar y salir con gel y usar la mscara en todo momento, incluso en la habitacin del paciente. Los pacientes tambin deben usar una mscara cuando el personal o su persona de apoyo estn en la habitacin. Los visitantes DEBEN LLEVAR ETIQUETA DE VISITANTE DE UNA MANERA VISIBLE. Un visitante adulto puede permanecer con usted durante la noche y DEBE estar en la habitacin a las 8 P.M.   Su procedimiento est programado en: 11/17/24   Presntese a la entrada principal del [facility]     Presntese a admisiones por la maana 9:45 AM   Llame a este nmero si tiene problemas la maana de la ciruga al [phone]   Siga Una dieta de Liquidos claros el dia aneterior a la cirugia  Agua Caf negro (con azcar, SIN LECHE, NI CREMA)  T normal y descafeinado (con azcar, SIN LECHE, NI CREMA)                              Gelatina normal (NO ROJA)  Helados de frutas  (sin pulpa. NO DE COLOR ROJO)                                     Helados de hielo (NO ROJO)                                                                  Jugo: de manzana, uva BLANCA, arndano BLANCO Bebidas deportivas como Gatorade (NO ROJAS)              Nada de beber despues de medianoche.          Si tiene preguntas, por favor. Pngase en contacto con la oficina de su cirujano.   SIGA LA PREPARACIN INTESTINAL Y CUALQUIER INSTRUCCIN ADICIONAL PREOPERATORIA QUE HAYA RECIBIDO DEL OFICINA DE DU CIRUJANO!!!     La higiene bucal tambin es importante para reducir el riesgo de infeccin.                                   Recuerde - LVESE LOS DIENTES EN LA MAANA DE LA CIRUGA CON SU PASTA DENTAL HABITUAL   Tome estos medicamentos en la maana de la ciruga con UN SORBO DE AGUA: No                              No debe trae ningn metal en el cuerpo, incluyendo pinzas para el cabello, joyas, ni aretes/pendientes             No use maquillaje, lociones/cremas, polvos, perfumes/colonias o desodorante  No use esmalte de uas, incluyendo los de gel ni S&S, uas artificiales/acrlicas o cualquier otro tipo de cobertura en las uas naturales, incluyendo las uas de las manos y avaya. Si tiene uas artificiales, con capas de gel, etc. que necesite que le quiten en un saln de uas, por favor, pida que se lo quiten antes de la ciruga o la ciruga podra ser cancelada/retrasada si el cirujano o el anestesilogo consideran que no puede ser monitoreado(a) de una forma segura.   No se rasure en las 48 horas antes de la operacin.    No traiga objetos de valor al hospital. [place] NO SE HACE RESPONSABLE DE LOS OBJETOS DE VALOR.   Los contactos, las dentaduras o los puentes no se pueden usar durante la ciruga.   Melonie una bolsa pequea para la noche el da de la [place].   NO TRAIGA AL HOSPITAL LOS MEDICAMENTOS QUE TOMA EN CASA . LA FARMACIA LE SUMINISTRAR LOS MEDICAMENTOS QUE  TENGA EN SU LISTA DE MEDICAMENTOS DURANTE SU ESTADA EN EL HOSPITAL!    Los pacientes dados de alta el mismo da de la ciruga no podrn company secretary a casa.  Es NECESARIO que alguien se quede con usted durante las primeras 24 horas despus de la anestesia.   Instrucciones especiales: Melonie query copia de sus documentos de poder notarial y testamento vital el da de su ciruga si no los ha escaneado antes.              Por favor,  lea las siguientes hojas informativas que le dieron: SI TIENE PREGUNTAS SOBRE SUS INSTRUCCIONES PREOPERATORIAS POR FAVOR LLAME AL [phone]                       PREPARACIN PARA LA CIRUGA                                            Preparing for Surgery  Debido a que la piel no est esterilizada, sta necesita estar lo ms libre de grmenes como sea posible.  Usted puede reducir el nmero de grmenes en la piel lavndose con el jabn de CHG (Chlorahexidine gluconate) antes de la ciruga.  El CHG es un jabn antisptico el cual mata los grmenes y se une a la piel para continuar matando los grmenes incluso hasta despus de lavarse. POR FAVOR NO LO USE SI USTED TIENE ALERGIAS AL CHG.  SI LA PIEL SE IRRITA, DEJE DE USAR EL CHG.  NO SE RASURE DURANTE AL MENOS 12 HORAS ANTES DE LA PRIMERA DUCHA CON EL CHG. Siga estas instrucciones cuidadosamente:  Dchese la noche anterior a la ciruga y de nuevo en la maana de la ciruga. Si decide lavarse el cabello, lvelo con su champ normal primero. Enjuague el cabello y el cuerpo para quitarse el [place]. Use el CHG como lo hara con cualquier otro jabn lquido, usando una toallita o esponja vegetal o exfoliante. Aplique el CHG al cuerpo solamente DEL CUELLO PARA ABAJO.  No lo use cerca de los ojos o los genitales. No se lave con su jabn normal despus de usar el CHG. Squese con una toalla limpia. Espere hasta la maana siguiente para aplicarse desodorantes, lociones, excepto en el da de la [place], NO SE APLIQUE  LOCIONES. Use pijamas limpias o una bata. Coloque sbanas limpias en su cama la noche de su primera ducha - no duerma con mascotas. 10.  Use ropa limpia al venir al hospital.

## 2024-11-10 ENCOUNTER — Encounter (HOSPITAL_COMMUNITY): Payer: Self-pay

## 2024-11-10 ENCOUNTER — Encounter (HOSPITAL_COMMUNITY)
Admission: RE | Admit: 2024-11-10 | Discharge: 2024-11-10 | Disposition: A | Discharge status: Home / Self Care | Source: Ambulatory Visit | Attending: Urology | Admitting: Urology

## 2024-11-10 ENCOUNTER — Other Ambulatory Visit: Payer: Self-pay

## 2024-11-10 VITALS — BP 185/92 | HR 77 | Temp 98.1°F | Resp 16 | Ht 59.0 in | Wt 160.0 lb

## 2024-11-10 DIAGNOSIS — R9431 Abnormal electrocardiogram [ECG] [EKG]: Secondary | ICD-10-CM | POA: Diagnosis not present

## 2024-11-10 DIAGNOSIS — Z112 Encounter for screening for other bacterial diseases: Secondary | ICD-10-CM | POA: Insufficient documentation

## 2024-11-10 DIAGNOSIS — I10 Essential (primary) hypertension: Secondary | ICD-10-CM | POA: Diagnosis not present

## 2024-11-10 DIAGNOSIS — Z01818 Encounter for other preprocedural examination: Secondary | ICD-10-CM | POA: Diagnosis present

## 2024-11-10 HISTORY — DX: Nausea with vomiting, unspecified: R11.2

## 2024-11-10 HISTORY — DX: Other specified postprocedural states: Z98.890

## 2024-11-10 LAB — BASIC METABOLIC PANEL WITH GFR
Anion gap: 9 (ref 5–15)
BUN: 20 mg/dL (ref 8–23)
CO2: 26 mmol/L (ref 22–32)
Calcium: 9.6 mg/dL (ref 8.9–10.3)
Chloride: 107 mmol/L (ref 98–111)
Creatinine, Ser: 0.54 mg/dL (ref 0.44–1.00)
GFR, Estimated: >60 mL/min
Glucose, Bld: 104 mg/dL — ABNORMAL HIGH (ref 70–99)
Potassium: 3.9 mmol/L (ref 3.5–5.1)
Sodium: 141 mmol/L (ref 135–145)

## 2024-11-10 LAB — CBC
HCT: 39.9 % (ref 36.0–46.0)
Hemoglobin: 12.9 g/dL (ref 12.0–15.0)
MCH: 27.9 pg (ref 26.0–34.0)
MCHC: 32.3 g/dL (ref 30.0–36.0)
MCV: 86.4 fL (ref 80.0–100.0)
Platelets: 237 K/uL (ref 150–400)
RBC: 4.62 MIL/uL (ref 3.87–5.11)
RDW: 19.8 % — ABNORMAL HIGH (ref 11.5–15.5)
WBC: 7.5 K/uL (ref 4.0–10.5)
nRBC: 0 % (ref 0.0–0.2)

## 2024-11-10 LAB — NO BLOOD PRODUCTS

## 2024-11-10 NOTE — Progress Notes (Signed)
 Date of COVID positive in last 90 days:  PCP - Folashade Paseda, FNP LOV 08/03/24 Cardiologist -   Chest x-ray - N/A EKG - 11/10/24 Epic/chart Stress Test - N/A ECHO - N/A Cardiac Cath - N/A Pacemaker/ICD device last checked:N/A Spinal Cord Stimulator:N/A  Bowel Prep - clears days before and flee enema night before. Patient aware  Sleep Study - N/A CPAP -   Fasting Blood Sugar - preDM Checks Blood Sugar _____ times a day  Last dose of GLP1 agonist-  N/A GLP1 instructions:  Do not take after     Last dose of SGLT-2 inhibitors-  N/A SGLT-2 instructions:  Do not take after     Blood Thinner Instructions: N/A Last dose:   Time: Aspirin  Instructions:N/A Last Dose:  Activity level:  Can go up a flight of stairs and perform activities of daily living without stopping and without symptoms of chest pain or shortness of breath.  Able to exercise without symptoms  Unable to go up a flight of stairs without symptoms of     Anesthesia review: N/A  Patient denies shortness of breath, fever, cough and chest pain at PAT appointment  Patient verbalized understanding of instructions that were given to them at the PAT appointment. Patient was also instructed that they will need to review over the PAT instructions again at home before surgery.

## 2024-11-11 LAB — URINE CULTURE: Culture: <10000 — AB

## 2024-11-12 NOTE — Progress Notes (Signed)
Culture results sent to Dr. Louis Meckel to review.

## 2024-11-16 NOTE — Anesthesia Preprocedure Evaluation (Addendum)
 "                                  Anesthesia Evaluation  Patient identified by MRN, date of birth, ID band Patient awake    Reviewed: Allergy  & Precautions, NPO status , Patient's Chart, lab work & pertinent test results  History of Anesthesia Complications (+) PONV and history of anesthetic complications  Airway Mallampati: II  TM Distance: >3 FB Neck ROM: Full    Dental no notable dental hx. (+) Teeth Intact, Dental Advisory Given   Pulmonary neg pulmonary ROS   Pulmonary exam normal breath sounds clear to auscultation       Cardiovascular hypertension, Pt. on medications Normal cardiovascular exam Rhythm:Regular Rate:Normal     Neuro/Psych negative neurological ROS     GI/Hepatic negative GI ROS, Neg liver ROS,,,  Endo/Other  negative endocrine ROS    Renal/GU negative Renal ROSLab Results      Component                Value               Date                                 K                        3.9                 11/10/2024                        CREATININE               0.54                11/10/2024                    Musculoskeletal  (+) Arthritis , Osteoarthritis,    Abdominal   Peds  Hematology Lab Results      Component                Value               Date                      WBC                      7.5                 11/10/2024                HGB                      12.9                11/10/2024                HCT                      39.9                11/10/2024                MCV  86.4                11/10/2024                PLT                      237                 11/10/2024              Anesthesia Other Findings   Reproductive/Obstetrics                              Anesthesia Physical Anesthesia Plan  ASA: 2  Anesthesia Plan: General   Post-op Pain Management: Lidocaine  infusion*, Ofirmev  IV (intra-op)* and Ketamine IV*   Induction: Intravenous  PONV  Risk Score and Plan: Treatment may vary due to age or medical condition, Ondansetron , Scopolamine patch - Pre-op, Propofol  infusion and TIVA  Airway Management Planned: Oral ETT  Additional Equipment: None  Intra-op Plan:   Post-operative Plan: Extubation in OR  Informed Consent: I have reviewed the patients History and Physical, chart, labs and discussed the procedure including the risks, benefits and alternatives for the proposed anesthesia with the patient or authorized representative who has indicated his/her understanding and acceptance.     Dental advisory given  Plan Discussed with: CRNA and Surgeon  Anesthesia Plan Comments: (TIVA GA)         Anesthesia Quick Evaluation  "

## 2024-11-17 ENCOUNTER — Encounter (HOSPITAL_COMMUNITY): Payer: Self-pay | Admitting: Urology

## 2024-11-17 ENCOUNTER — Ambulatory Visit (HOSPITAL_COMMUNITY): Admitting: Anesthesiology

## 2024-11-17 ENCOUNTER — Observation Stay (HOSPITAL_COMMUNITY)
Admission: RE | Admit: 2024-11-17 | Discharge: 2024-11-18 | Disposition: A | Discharge status: Home / Self Care | Attending: Urology | Admitting: Urology

## 2024-11-17 ENCOUNTER — Encounter (HOSPITAL_COMMUNITY)
Admission: RE | Disposition: A | Discharge status: Home / Self Care | Payer: Self-pay | Source: Home / Self Care | Attending: Urology

## 2024-11-17 ENCOUNTER — Ambulatory Visit (HOSPITAL_COMMUNITY): Payer: Self-pay | Admitting: Medical

## 2024-11-17 ENCOUNTER — Other Ambulatory Visit: Payer: Self-pay

## 2024-11-17 DIAGNOSIS — N72 Inflammatory disease of cervix uteri: Secondary | ICD-10-CM | POA: Diagnosis not present

## 2024-11-17 DIAGNOSIS — N814 Uterovaginal prolapse, unspecified: Secondary | ICD-10-CM | POA: Diagnosis not present

## 2024-11-17 DIAGNOSIS — M810 Age-related osteoporosis without current pathological fracture: Secondary | ICD-10-CM | POA: Diagnosis not present

## 2024-11-17 DIAGNOSIS — I10 Essential (primary) hypertension: Secondary | ICD-10-CM | POA: Insufficient documentation

## 2024-11-17 DIAGNOSIS — Z79899 Other long term (current) drug therapy: Secondary | ICD-10-CM | POA: Diagnosis not present

## 2024-11-17 DIAGNOSIS — D271 Benign neoplasm of left ovary: Secondary | ICD-10-CM | POA: Diagnosis not present

## 2024-11-17 DIAGNOSIS — D259 Leiomyoma of uterus, unspecified: Principal | ICD-10-CM | POA: Insufficient documentation

## 2024-11-17 HISTORY — PX: ROBOTIC ASSISTED SUPRACERVICAL HYSTERECTOMY WITH BILATERAL SALPINGO OOPHERECTOMY: SHX6084

## 2024-11-17 HISTORY — PX: ROBOTIC ASSISTED LAPAROSCOPIC SACROCOLPOPEXY: SHX5388

## 2024-11-17 LAB — HEMOGLOBIN AND HEMATOCRIT, BLOOD
HCT: 35.8 % — ABNORMAL LOW (ref 36.0–46.0)
Hemoglobin: 11.7 g/dL — ABNORMAL LOW (ref 12.0–15.0)

## 2024-11-17 SURGERY — HYSTERECTOMY, SUPRACERVICAL, ROBOT-ASSISTED, LAPAROSCOPIC, WITH BILATERAL SALPINGECTOMY
Anesthesia: General | Site: Pelvis

## 2024-11-17 MED ORDER — CEFAZOLIN SODIUM-DEXTROSE 2-4 GM/100ML-% IV SOLN
2.0000 g | INTRAVENOUS | Status: AC
  Administered 2024-11-17: 2 g via INTRAVENOUS
  Filled 2024-11-17: qty 100

## 2024-11-17 MED ORDER — ESTRADIOL 0.01 % VA CREA: 1.0000 | TOPICAL_CREAM | VAGINAL | 12 refills | Status: DC

## 2024-11-17 MED ORDER — GLYCOPYRROLATE 0.2 MG/ML IJ SOLN
INTRAMUSCULAR | Status: DC | PRN
  Administered 2024-11-17: .2 mg via INTRAVENOUS

## 2024-11-17 MED ORDER — ONDANSETRON HCL 4 MG/2ML IJ SOLN
INTRAMUSCULAR | Status: AC
  Filled 2024-11-17: qty 2

## 2024-11-17 MED ORDER — TRAMADOL HCL 50 MG PO TABS: 50.0000 mg | ORAL_TABLET | Freq: Four times a day (QID) | ORAL | Status: DC | PRN

## 2024-11-17 MED ORDER — SUGAMMADEX SODIUM 200 MG/2ML IV SOLN
INTRAVENOUS | Status: DC | PRN
  Administered 2024-11-17: 200 mg via INTRAVENOUS

## 2024-11-17 MED ORDER — DIPHENHYDRAMINE HCL 12.5 MG/5ML PO ELIX: 12.5000 mg | ORAL_SOLUTION | Freq: Four times a day (QID) | ORAL | Status: DC | PRN

## 2024-11-17 MED ORDER — BUPIVACAINE-EPINEPHRINE 0.5% -1:200000 IJ SOLN
INTRAMUSCULAR | Status: DC | PRN
  Administered 2024-11-17: 30 mL

## 2024-11-17 MED ORDER — ONDANSETRON HCL 4 MG/2ML IJ SOLN: 4.0000 mg | INTRAMUSCULAR | Status: DC | PRN

## 2024-11-17 MED ORDER — KETOROLAC TROMETHAMINE 15 MG/ML IJ SOLN
15.0000 mg | Freq: Four times a day (QID) | INTRAMUSCULAR | Status: DC
  Administered 2024-11-17 – 2024-11-18 (×4): 15 mg via INTRAVENOUS
  Filled 2024-11-17 (×4): qty 1

## 2024-11-17 MED ORDER — DIPHENHYDRAMINE HCL 50 MG/ML IJ SOLN: 12.5000 mg | Freq: Four times a day (QID) | INTRAMUSCULAR | Status: DC | PRN

## 2024-11-17 MED ORDER — PROPOFOL 10 MG/ML IV BOLUS
INTRAVENOUS | Status: DC | PRN
  Administered 2024-11-17: 120 mg via INTRAVENOUS

## 2024-11-17 MED ORDER — BUPIVACAINE LIPOSOME 1.3 % IJ SUSP
INTRAMUSCULAR | Status: DC | PRN
  Administered 2024-11-17: 20 mL

## 2024-11-17 MED ORDER — FENTANYL CITRATE (PF) 50 MCG/ML IJ SOSY
25.0000 ug | PREFILLED_SYRINGE | INTRAMUSCULAR | Status: DC | PRN
  Administered 2024-11-17: 50 ug via INTRAVENOUS

## 2024-11-17 MED ORDER — BUPIVACAINE LIPOSOME 1.3 % IJ SUSP
INTRAMUSCULAR | Status: AC
  Filled 2024-11-17: qty 20

## 2024-11-17 MED ORDER — PROPOFOL 10 MG/ML IV BOLUS
INTRAVENOUS | Status: AC
  Filled 2024-11-17: qty 20

## 2024-11-17 MED ORDER — SUGAMMADEX SODIUM 200 MG/2ML IV SOLN
INTRAVENOUS | Status: AC
  Filled 2024-11-17: qty 2

## 2024-11-17 MED ORDER — SULFAMETHOXAZOLE-TRIMETHOPRIM 800-160 MG PO TABS: 1.0000 | ORAL_TABLET | Freq: Two times a day (BID) | ORAL | 0 refills | Status: DC

## 2024-11-17 MED ORDER — FENTANYL CITRATE (PF) 100 MCG/2ML IJ SOLN
INTRAMUSCULAR | Status: AC
  Filled 2024-11-17: qty 2

## 2024-11-17 MED ORDER — ACETAMINOPHEN 10 MG/ML IV SOLN
INTRAVENOUS | Status: DC | PRN
  Administered 2024-11-17: 1000 mg via INTRAVENOUS

## 2024-11-17 MED ORDER — LACTATED RINGERS IV SOLN: INTRAVENOUS | Status: DC

## 2024-11-17 MED ORDER — PROPOFOL 1000 MG/100ML IV EMUL
INTRAVENOUS | Status: AC
  Filled 2024-11-17: qty 100

## 2024-11-17 MED ORDER — KETAMINE HCL 10 MG/ML IJ SOLN
INTRAMUSCULAR | Status: DC | PRN
  Administered 2024-11-17 (×2): 10 mg via INTRAVENOUS
  Administered 2024-11-17: 20 mg via INTRAVENOUS

## 2024-11-17 MED ORDER — LIDOCAINE HCL (PF) 2 % IJ SOLN
INTRAMUSCULAR | Status: AC
  Filled 2024-11-17: qty 5

## 2024-11-17 MED ORDER — TRAMADOL HCL 50 MG PO TABS: 50.0000 mg | ORAL_TABLET | Freq: Four times a day (QID) | ORAL | 0 refills | Status: DC | PRN

## 2024-11-17 MED ORDER — ACETAMINOPHEN 10 MG/ML IV SOLN: 1000.0000 mg | Freq: Once | INTRAVENOUS | Status: DC | PRN

## 2024-11-17 MED ORDER — DEXAMETHASONE SOD PHOSPHATE PF 10 MG/ML IJ SOLN
INTRAMUSCULAR | Status: DC | PRN
  Administered 2024-11-17: 10 mg via INTRAVENOUS

## 2024-11-17 MED ORDER — FLEET ENEMA RE ENEM: 1.0000 | ENEMA | Freq: Once | RECTAL | Status: DC

## 2024-11-17 MED ORDER — LIDOCAINE HCL (PF) 2 % IJ SOLN
INTRAMUSCULAR | Status: DC | PRN
  Administered 2024-11-17: 1.5 mg/kg/h via INTRADERMAL

## 2024-11-17 MED ORDER — OXYCODONE HCL 5 MG/5ML PO SOLN: 5.0000 mg | Freq: Once | ORAL | Status: DC | PRN

## 2024-11-17 MED ORDER — ACETAMINOPHEN 10 MG/ML IV SOLN
1000.0000 mg | Freq: Four times a day (QID) | INTRAVENOUS | Status: AC
  Administered 2024-11-17 – 2024-11-18 (×4): 1000 mg via INTRAVENOUS
  Filled 2024-11-17 (×4): qty 100

## 2024-11-17 MED ORDER — LACTATED RINGERS IR SOLN
Status: DC | PRN
  Administered 2024-11-17: 1000 mL

## 2024-11-17 MED ORDER — LOSARTAN POTASSIUM 50 MG PO TABS
50.0000 mg | ORAL_TABLET | Freq: Every day | ORAL | Status: DC
  Administered 2024-11-18: 50 mg via ORAL
  Filled 2024-11-17 (×2): qty 1

## 2024-11-17 MED ORDER — OXYCODONE HCL 5 MG PO TABS: 5.0000 mg | ORAL_TABLET | Freq: Once | ORAL | Status: DC | PRN

## 2024-11-17 MED ORDER — ACETAMINOPHEN 10 MG/ML IV SOLN
INTRAVENOUS | Status: AC
  Filled 2024-11-17: qty 100

## 2024-11-17 MED ORDER — LIDOCAINE HCL (CARDIAC) PF 100 MG/5ML IV SOSY
PREFILLED_SYRINGE | INTRAVENOUS | Status: DC | PRN
  Administered 2024-11-17: 80 mg via INTRAVENOUS

## 2024-11-17 MED ORDER — STERILE WATER FOR IRRIGATION IR SOLN
Status: DC | PRN
  Administered 2024-11-17: 1000 mL

## 2024-11-17 MED ORDER — BUPIVACAINE-EPINEPHRINE (PF) 0.5% -1:200000 IJ SOLN
INTRAMUSCULAR | Status: AC
  Filled 2024-11-17: qty 30

## 2024-11-17 MED ORDER — KETAMINE HCL 50 MG/5ML IJ SOSY
PREFILLED_SYRINGE | INTRAMUSCULAR | Status: AC
  Filled 2024-11-17: qty 5

## 2024-11-17 MED ORDER — ROCURONIUM BROMIDE 100 MG/10ML IV SOLN
INTRAVENOUS | Status: DC | PRN
  Administered 2024-11-17: 60 mg via INTRAVENOUS
  Administered 2024-11-17: 20 mg via INTRAVENOUS
  Administered 2024-11-17: 10 mg via INTRAVENOUS

## 2024-11-17 MED ORDER — ONDANSETRON HCL 4 MG/2ML IJ SOLN: 4.0000 mg | Freq: Once | INTRAMUSCULAR | Status: DC | PRN

## 2024-11-17 MED ORDER — ESTRADIOL 0.01 % VA CREA
TOPICAL_CREAM | VAGINAL | Status: DC | PRN
  Administered 2024-11-17: 1 via VAGINAL

## 2024-11-17 MED ORDER — ESTRADIOL 0.01 % VA CREA
TOPICAL_CREAM | VAGINAL | Status: AC
  Filled 2024-11-17: qty 42.5

## 2024-11-17 MED ORDER — FENTANYL CITRATE (PF) 100 MCG/2ML IJ SOLN
INTRAMUSCULAR | Status: DC | PRN
  Administered 2024-11-17 (×4): 50 ug via INTRAVENOUS

## 2024-11-17 MED ORDER — ORAL CARE MOUTH RINSE: 15.0000 mL | Freq: Once | OROMUCOSAL | Status: AC

## 2024-11-17 MED ORDER — HYDROMORPHONE HCL 1 MG/ML IJ SOLN: 0.5000 mg | INTRAMUSCULAR | Status: DC | PRN

## 2024-11-17 MED ORDER — CHLORHEXIDINE GLUCONATE 0.12 % MT SOLN
15.0000 mL | Freq: Once | OROMUCOSAL | Status: AC
  Administered 2024-11-17: 15 mL via OROMUCOSAL

## 2024-11-17 MED ORDER — ONDANSETRON HCL 4 MG/2ML IJ SOLN
INTRAMUSCULAR | Status: DC | PRN
  Administered 2024-11-17: 4 mg via INTRAVENOUS

## 2024-11-17 MED ORDER — DOCUSATE SODIUM 100 MG PO CAPS
100.0000 mg | ORAL_CAPSULE | Freq: Two times a day (BID) | ORAL | Status: DC
  Administered 2024-11-17 – 2024-11-18 (×2): 100 mg via ORAL
  Filled 2024-11-17 (×2): qty 1

## 2024-11-17 MED ORDER — HYOSCYAMINE SULFATE 0.125 MG SL SUBL: 0.1250 mg | SUBLINGUAL_TABLET | SUBLINGUAL | Status: DC | PRN

## 2024-11-17 MED ORDER — FENTANYL CITRATE (PF) 50 MCG/ML IJ SOSY
PREFILLED_SYRINGE | INTRAMUSCULAR | Status: AC
  Filled 2024-11-17: qty 1

## 2024-11-17 MED ORDER — PROPOFOL 500 MG/50ML IV EMUL
INTRAVENOUS | Status: DC | PRN
  Administered 2024-11-17: 125 ug/kg/min via INTRAVENOUS

## 2024-11-17 MED ORDER — SODIUM CHLORIDE 0.45 % IV SOLN: INTRAVENOUS | Status: DC

## 2024-11-17 SURGICAL SUPPLY — 67 items
BAG COUNTER SPONGE SURGICOUNT (BAG) IMPLANT
BAG URINE DRAIN 2000ML AR STRL (UROLOGICAL SUPPLIES) IMPLANT
BRIEF MESH DISP LRG (UNDERPADS AND DIAPERS) IMPLANT
CATH FOLEY 2WAY SLVR 5CC 16FR (CATHETERS) ×2 IMPLANT
CHLORAPREP W/TINT 26 (MISCELLANEOUS) ×2 IMPLANT
CLIP LIGATING HEM O LOK PURPLE (MISCELLANEOUS) ×2 IMPLANT
CLIP LIGATING HEMO LOK XL GOLD (MISCELLANEOUS) IMPLANT
CLIP LIGATING HEMO O LOK GREEN (MISCELLANEOUS) IMPLANT
CLIP SUT LAPRA TY ABSORB (SUTURE) IMPLANT
COVER BACK TABLE 60X90IN (DRAPES) ×2 IMPLANT
COVER SURGICAL LIGHT HANDLE (MISCELLANEOUS) ×2 IMPLANT
COVER TIP SHEARS 8 DVNC (MISCELLANEOUS) ×2 IMPLANT
DERMABOND ADVANCED .7 DNX12 (GAUZE/BANDAGES/DRESSINGS) ×2 IMPLANT
DRAPE ARM DVNC X/XI (DISPOSABLE) ×8 IMPLANT
DRAPE COLUMN DVNC XI (DISPOSABLE) ×2 IMPLANT
DRAPE INCISE IOBAN 66X45 STRL (DRAPES) ×2 IMPLANT
DRAPE SHEET LG 3/4 BI-LAMINATE (DRAPES) ×4 IMPLANT
DRAPE SURG IRRIG POUCH 19X23 (DRAPES) ×2 IMPLANT
DRIVER NDLE LRG 8 DVNC XI (INSTRUMENTS) ×4 IMPLANT
ELECT PENCIL ROCKER SW 15FT (MISCELLANEOUS) ×2 IMPLANT
ELECT REM PT RETURN 15FT ADLT (MISCELLANEOUS) ×2 IMPLANT
FORCEPS BPLR LNG DVNC XI (INSTRUMENTS) ×2 IMPLANT
FORCEPS PROGRASP DVNC XI (FORCEP) ×2 IMPLANT
FORCEPS TENACULUM DVNC XI (FORCEP) IMPLANT
GAUZE 4X4 16PLY ~~LOC~~+RFID DBL (SPONGE) IMPLANT
GLOVE BIO SURGEON STRL SZ 6.5 (GLOVE) ×2 IMPLANT
GLOVE SURG LX STRL 7.5 STRW (GLOVE) ×4 IMPLANT
GOWN STRL REUS W/ TWL XL LVL3 (GOWN DISPOSABLE) ×4 IMPLANT
GOWN STRL SURGICAL XL XLNG (GOWN DISPOSABLE) ×2 IMPLANT
HOLDER FOLEY CATH W/STRAP (MISCELLANEOUS) ×2 IMPLANT
IRRIGATION SUCT STRKRFLW 2 WTP (MISCELLANEOUS) ×2 IMPLANT
KIT BASIN OR (CUSTOM PROCEDURE TRAY) ×2 IMPLANT
KIT TURNOVER KIT A (KITS) ×2 IMPLANT
MANIPULATOR UTERINE 4.5 ZUMI (MISCELLANEOUS) IMPLANT
MARKER SKIN DUAL TIP RULER LAB (MISCELLANEOUS) ×2 IMPLANT
MESH Y UPSYLON VAGINAL (Mesh General) IMPLANT
OCCLUDER COLPOPNEUMO (BALLOONS) IMPLANT
PACKING VAGINAL (PACKING) IMPLANT
PAD OB MATERNITY 11 LF (PERSONAL CARE ITEMS) IMPLANT
PAD POSITIONING PINK XL (MISCELLANEOUS) ×2 IMPLANT
SCISSORS LAP 5X45 EPIX DISP (ENDOMECHANICALS) IMPLANT
SCISSORS MNPLR CVD DVNC XI (INSTRUMENTS) ×2 IMPLANT
SCRUB CHG 4% DYNA-HEX 4OZ (MISCELLANEOUS) IMPLANT
SEAL UNIV 5-12 XI (MISCELLANEOUS) ×8 IMPLANT
SET IRRIG Y TYPE TUR BLADDER L (SET/KITS/TRAYS/PACK) IMPLANT
SET TUBE SMOKE EVAC HIGH FLOW (TUBING) ×2 IMPLANT
SHEET LAVH (DRAPES) ×2 IMPLANT
SOL PREP POV-IOD 4OZ 10% (MISCELLANEOUS) IMPLANT
SOLUTION ELECTROSURG ANTI STCK (MISCELLANEOUS) ×2 IMPLANT
SURGILUBE 2OZ TUBE FLIPTOP (MISCELLANEOUS) IMPLANT
SUT MNCRL AB 4-0 PS2 18 (SUTURE) ×4 IMPLANT
SUT MON AB 3-0 SH27 (SUTURE) IMPLANT
SUT PDS PLUS AB 0 CT-2 (SUTURE) ×2 IMPLANT
SUT PROLENE 2 0 CT 1 (SUTURE) ×2 IMPLANT
SUT STRATA 2-0 15 CT-1.5 (SUTURE) IMPLANT
SUT STRATAFIX SPIRAL PDS3-0 (SUTURE) ×2 IMPLANT
SUT VIC AB 0 CT1 27XBRD ANTBC (SUTURE) ×6 IMPLANT
SUT VIC AB 2-0 SH 27XBRD (SUTURE) ×10 IMPLANT
SUT VIC AB 3-0 SH 27X BRD (SUTURE) ×2 IMPLANT
SUT VICRYL 0 UR6 27IN ABS (SUTURE) IMPLANT
SYR 50ML LL SCALE MARK (SYRINGE) IMPLANT
SYSTEM BAG RETRIEVAL 10MM (BASKET) IMPLANT
TOWEL OR DSP ST BLU DLX 10/PK (DISPOSABLE) ×2 IMPLANT
TRAY LAPAROSCOPIC (CUSTOM PROCEDURE TRAY) ×2 IMPLANT
TROCAR Z THREAD OPTICAL 12X100 (TROCAR) ×2 IMPLANT
TROCAR Z-THREAD FIOS 5X100MM (TROCAR) ×2 IMPLANT
WATER STERILE IRR 1000ML POUR (IV SOLUTION) ×2 IMPLANT

## 2024-11-17 NOTE — Anesthesia Procedure Notes (Signed)
 Procedure Name: Intubation Date/Time: 11/17/2024 11:42 AM  Performed by: Areatha Glendia HERO, CRNAPre-anesthesia Checklist: Patient identified, Emergency Drugs available, Suction available and Patient being monitored Patient Re-evaluated:Patient Re-evaluated prior to induction Oxygen Delivery Method: Circle System Utilized Preoxygenation: Pre-oxygenation with 100% oxygen Induction Type: IV induction Ventilation: Mask ventilation without difficulty Laryngoscope Size: Mac and 3 Grade View: Grade I Tube type: Oral Tube size: 7.0 mm Number of attempts: 1 Airway Equipment and Method: Stylet and Oral airway Placement Confirmation: ETT inserted through vocal cords under direct vision, positive ETCO2 and breath sounds checked- equal and bilateral Secured at: 20 cm Tube secured with: Tape Dental Injury: Teeth and Oropharynx as per pre-operative assessment

## 2024-11-17 NOTE — Anesthesia Postprocedure Evaluation (Signed)
"   Anesthesia Post Note  Patient: Elizabeth Jennings  Procedure(s) Performed: HYSTERECTOMY, SUPRACERVICAL, ROBOT-ASSISTED, LAPAROSCOPIC, WITH BILATERAL SALPINGECTOMY (Bilateral: Pelvis) SACROCOLPOPEXY, ROBOT-ASSISTED, LAPAROSCOPIC     Patient location during evaluation: PACU Anesthesia Type: General Level of consciousness: awake and alert Pain management: pain level controlled Vital Signs Assessment: post-procedure vital signs reviewed and stable Respiratory status: spontaneous breathing, nonlabored ventilation, respiratory function stable and patient connected to nasal cannula oxygen Cardiovascular status: blood pressure returned to baseline and stable Postop Assessment: no apparent nausea or vomiting Anesthetic complications: no   No notable events documented.  Last Vitals:  Vitals:   11/17/24 1645 11/17/24 1657  BP: (!) 199/67   Pulse: (!) 49 (!) 46  Resp: 19 17  Temp:    SpO2: 97% 93%    Last Pain:  Vitals:   11/17/24 1657  TempSrc:   PainSc: Asleep                 Garnette DELENA Gab      "

## 2024-11-17 NOTE — Plan of Care (Signed)

## 2024-11-17 NOTE — Interval H&P Note (Signed)
 History and Physical Interval Note:  11/17/2024 11:04 AM  Elizabeth Jennings  has presented today for surgery, with the diagnosis of PELVIC ORGAN PROLAPSE.  The various methods of treatment have been discussed with the patient and family. After consideration of risks, benefits and other options for treatment, the patient has consented to  Procedures with comments: HYSTERECTOMY, SUPRACERVICAL, ROBOT-ASSISTED, LAPAROSCOPIC, WITH BILATERAL SALPINGECTOMY (Bilateral) - ROBOTIC SUPRACERVICAL HYSTERECTOMY WITH BILATERAL SALPINGO-OOPHORECTOMY AND SACROCOLPOPEXY SACROCOLPOPEXY, ROBOT-ASSISTED, LAPAROSCOPIC (N/A) as a surgical intervention.  The patient's history has been reviewed, patient examined, no change in status, stable for surgery.  I have reviewed the patient's chart and labs.  Questions were answered to the patient's satisfaction.     [name]

## 2024-11-17 NOTE — Transfer of Care (Signed)
 Immediate Anesthesia Transfer of Care Note  Patient: Elizabeth Jennings  Procedure(s) Performed: HYSTERECTOMY, SUPRACERVICAL, ROBOT-ASSISTED, LAPAROSCOPIC, WITH BILATERAL SALPINGECTOMY (Bilateral: Pelvis) SACROCOLPOPEXY, ROBOT-ASSISTED, LAPAROSCOPIC  Patient Location: PACU  Anesthesia Type:General  Level of Consciousness: sedated and responds to stimulation  Airway & Oxygen Therapy: Patient Spontanous Breathing and Patient connected to face mask oxygen  Post-op Assessment: Report given to RN and Post -op Vital signs reviewed and stable  Post vital signs: Reviewed and stable  Last Vitals:  Vitals Value Taken Time  BP 124/61 11/17/24 14:57  Temp 97   Pulse 51 11/17/24 14:59  Resp 21 11/17/24 14:59  SpO2 95 % 11/17/24 14:59  Vitals shown include unfiled device data.  Last Pain:  Vitals:   11/17/24 1021  TempSrc: Oral         Complications: No notable events documented.

## 2024-11-17 NOTE — Discharge Instructions (Signed)

## 2024-11-17 NOTE — Op Note (Signed)
 Preoperative diagnosis:   Pelvic organ prolapse  Postoperative diagnosis:   Same  Procedure: Robotic-assisted laparoscopic supracervical hysterectomy and  bilateral salpingo-oopherectomy Robotic-assisted laparoscopic sacrocolpopexy  Surgeon: Morene MICAEL Salines, MD First assistant: Alan Hammonds, PA-C  An assistant was required for this surgical procedure.  The duties of the assistant included but were not limited to suctioning, passing suture, camera manipulation, retraction. This procedure would not be able to be performed without an geophysicist/field seismologist.   Anesthesia: General  Complications: None  Intraoperative findings:   #1 - retroverted uterus.  Part of cervix removed, posterior apex of vagina was opened and closed with 2-0 stratifix suture.  #2 - [employer] Upsilon Y mesh used for the sacrocolpopexy.  EBL: 50 mL  Specimens: Uterus and proximal cervix, bilateral fallopian tubes and ovaries  Indication: Elizabeth Jennings is a 78 y.o. female patient with symptomatic pelvic organ prolapse.    After reviewing the management options for treatment, she elected to proceed with the above surgical procedure(s). We have discussed the potential benefits and risks of the procedure, side effects of the proposed treatment, the likelihood of the patient achieving the goals of the procedure, and any potential problems that might occur during the procedure or recuperation. Informed consent has been obtained.  Description of procedure:  The patient was taken to the operating room and general anesthesia was induced.  The patient was placed in the dorsal lithotomy position, prepped and draped in the usual sterile fashion, and preoperative antibiotics were administered. A preoperative time-out was performed.    A Foley catheter was then placed and placed to gravity drainage. I then made a periumbilical incision carrying the dissection down to the patient's fascia with electrocautery.  Once to the fascia,  the fascia was incised and a small puncture hole made in the peritoneum to allow passage of a 8mm port.   The abdomen was insufflated and the remaining ports placed under digital guidance.  2 ports were placed lateral to the umbilicus on the right proximally 10 cm apart.  The most lateral port was approximately 3 cm above the anterior iliac spine.  2 additional ports were placed in the patient's right side in comparable positions to the most lateral port on the right was a 12 mm port.the robot was then docked at an angle from the leg obliquely along the side of the left leg.  We then began our surgery by cleaning up some of the pelvic adhesions to the small bowel and colon.  Once this was completed I started dissecting at the sacral promontory located 3 cm medial to the location where the ureter crosses over the right iliac vessels at the pelvic brim. The posterior peritoneum was incised and the sacral prominence cleared off an area taking care to avoid the middle sacral vessels and the iliac branches.  I then created a posterior peritoneal tunnel starting at the sacral promontory and tunneling down the right pelvic sidewall down into the pelvis breaking back through the posterior peritoneum around the vesico-vaginal junction posteriorly.  I then continued the posterior dissection retracting down on the rectum and finding the avascular plane between the posterior vaginal wall and the rectum.  I carried this dissection down as far as I could to along the area of the perineal body.  Then focused my attention to the uterus and hysterectomy.  I first started by taking the right round ligament with a series of by polar cautery.  I then dissected the anterior leaf of the broad ligament  slightly more proximal and then distally down across the anterior mucosa to the salpinx and the internal cervical os.  I then took of the uterine ovarian ligament on the right and dissected free the right salpinx. Once the anterior leaf  of the broad ligament had been completely dissected on the patient's right and a small bladder flap had been created anteriorly attention was turned to the left side where a similar dissection was carried out. I then turned my attention to the anterior plane between the anterior vaginal wall and the bladder.  I was able to obtain access to the avascular plane and with a combination of both monopolar cautery and blunt dissection was able to get down to the bladder neck.  I then turned my attention back to the patient's uterus and skeletonized the right uterine artery and vein and then took this with a series of bipolar moves.  I then performed a similar uterus pedicle ligation on the left.  At his point I was able to identify the patient's cervix and came through supracervical with monopolar cautery once the uterus was freed from all its attachments it was pushed into the left paracolic gutter and our attention was turned to placing the mesh.  Mesh was measured at approximately 7.5 cm anteriorly and 7.5 cm posteriorly and I cut this on the back table.  The mesh was then placed into the patient's abdomen through the assistant port and the anterior leaf was secured down onto the anterior vaginal wall with the apex at the bladder neck.  The posterior leaf was then secured down on the posterior vaginal wall.  These were sewn down with 2-0 Vicryl.  Between 6 and 8 were done on each side.  At this point I then went back to the previously dissected sacral promontory and posterior peritoneal tunnel and inserted a instrument through the tunnel and grasped the end of the mesh at the vaginal cuff and pull it up to the sacrum.  I then checked to ensure that the sacral mesh was not too tight by performing a vaginal exam.  I then secured the sacral leg of the mesh using a 0 Prolene.  I then reapproximated the posterior peritoneum with a 2-0 Vicryl in a running fashion around the sacral promontory.  The pelvic peritoneum was  closed using a pursestring.  A small Endo Catch bag was then gently passed through the assistant port and the uterus was placed in the bag.  The bag was then brought out through the camera port once the trochars were removed.  We then made a slightly larger extraction incision to remove the uterus.  The fascia was then closed with 0 Vicryl in a figure-of-eight fashion.  The skin was closed with 4-0 Monocryl's.  Dermabond was applied to the incision and exparel  injected into the incisions.  Estrace impregnated packing was then placed into her vagina which will be left in overnight.  The patient was subsequently extubated and returned to the PACU in excellent condition.

## 2024-11-18 ENCOUNTER — Encounter (HOSPITAL_COMMUNITY): Payer: Self-pay | Admitting: Urology

## 2024-11-18 DIAGNOSIS — D259 Leiomyoma of uterus, unspecified: Secondary | ICD-10-CM | POA: Diagnosis not present

## 2024-11-18 LAB — BASIC METABOLIC PANEL WITH GFR
Anion gap: 13 (ref 5–15)
BUN: 13 mg/dL (ref 8–23)
CO2: 21 mmol/L — ABNORMAL LOW (ref 22–32)
Calcium: 9.1 mg/dL (ref 8.9–10.3)
Chloride: 100 mmol/L (ref 98–111)
Creatinine, Ser: 0.61 mg/dL (ref 0.44–1.00)
GFR, Estimated: >60 mL/min
Glucose, Bld: 114 mg/dL — ABNORMAL HIGH (ref 70–99)
Potassium: 3.7 mmol/L (ref 3.5–5.1)
Sodium: 134 mmol/L — ABNORMAL LOW (ref 135–145)

## 2024-11-18 LAB — HEMOGLOBIN AND HEMATOCRIT, BLOOD
HCT: 37.5 % (ref 36.0–46.0)
Hemoglobin: 12.5 g/dL (ref 12.0–15.0)

## 2024-11-18 MED ORDER — TRAMADOL HCL 50 MG PO TABS: 50.0000 mg | ORAL_TABLET | Freq: Four times a day (QID) | ORAL | 0 refills | Status: AC | PRN

## 2024-11-18 MED ORDER — SULFAMETHOXAZOLE-TRIMETHOPRIM 800-160 MG PO TABS: 1.0000 | ORAL_TABLET | Freq: Two times a day (BID) | ORAL | 0 refills | Status: AC

## 2024-11-18 MED ORDER — ESTRADIOL 0.01 % VA CREA: 1.0000 | TOPICAL_CREAM | VAGINAL | 12 refills | Status: AC

## 2024-11-18 NOTE — Progress Notes (Signed)
 Urology Inpatient Progress Report    Intv/Subj: No acute events overnight.  Catheter and vaginal packing removed this AM.  She has not yet voided or passed flatus.    Principal Problem:   Cystocele with prolapse  Current Facility-Administered Medications  Medication Dose Route Frequency Provider Last Rate Last Admin   0.45 % sodium chloride  infusion   Intravenous Continuous Cory Palma, PA-C 75 mL/hr at 11/17/24 1734 New Bag at 11/17/24 1734   acetaminophen  (OFIRMEV ) IV 1,000 mg  1,000 mg Intravenous Q6H Cory Palma, PA-C 400 mL/hr at 11/18/24 0635 1,000 mg at 11/18/24 0635   diphenhydrAMINE (BENADRYL) injection 12.5 mg  12.5 mg Intravenous Q6H PRN Cory Palma, PA-C       Or   diphenhydrAMINE (BENADRYL) 12.5 MG/5ML elixir 12.5 mg  12.5 mg Oral Q6H PRN Cory Palma, PA-C       docusate sodium  (COLACE) capsule 100 mg  100 mg Oral BID Dancy, Amanda, PA-C   100 mg at 11/17/24 2150   HYDROmorphone  (DILAUDID ) injection 0.5-1 mg  0.5-1 mg Intravenous Q2H PRN Cory Palma, PA-C       hyoscyamine (LEVSIN SL) SL tablet 0.125-0.25 mg  0.125-0.25 mg Sublingual Q4H PRN Cory Palma, PA-C       ketorolac  (TORADOL ) 15 MG/ML injection 15 mg  15 mg Intravenous Q6H Dancy, Palma, PA-C   15 mg at 11/18/24 9367   losartan  (COZAAR ) tablet 50 mg  50 mg Oral Daily Dancy, Amanda, PA-C       ondansetron  (ZOFRAN ) injection 4 mg  4 mg Intravenous Q4H PRN Cory Palma, PA-C       traMADol  (ULTRAM ) tablet 50-100 mg  50-100 mg Oral Q6H PRN Cory Palma, PA-C         Objective: Vital: Vitals:   11/17/24 2043 11/17/24 2232 11/18/24 0123 11/18/24 0537  BP: (!) 213/81 (!) 180/87 (!) 163/95 (!) 158/79  Pulse: 70 68 76 79  Resp: 18 17 19 17   Temp: 98.5 F (36.9 C) 98.8 F (37.1 C) 98.4 F (36.9 C) 98.7 F (37.1 C)  TempSrc: Oral Oral Oral Oral  SpO2: 93% 91% 90% 90%  Weight:      Height:       I/Os: I/O last 3 completed shifts: In: 2714.7 [P.O.:420; I.V.:1894.7; IV Piggyback:400] Out: 2900  [Urine:2875; Blood:25]  Physical Exam:  General: Patient is in no apparent distress Lungs: Normal respiratory effort, chest expands symmetrically. GI: Incisions are c/d/i. The abdomen is soft and appropriately ttp Foley: removed  Ext: lower extremities symmetric  Lab Results: Recent Labs    11/17/24 1536 11/18/24 0516  HGB 11.7* 12.5  HCT 35.8* 37.5   Recent Labs    11/18/24 0516  NA 134*  K 3.7  CL 100  CO2 21*  GLUCOSE 114*  BUN 13  CREATININE 0.61  CALCIUM 9.1   No results for input(s): LABPT, INR in the last 72 hours. No results for input(s): LABURIN in the last 72 hours. Results for orders placed or performed during the hospital encounter of 11/10/24  Urine Culture     Status: Abnormal   Collection Time: 11/10/24  3:24 PM   Specimen: Urine, Clean Catch  Result Value Ref Range Status   Specimen Description   Final    URINE, CLEAN CATCH Performed at [facility], [address] [place], [place] [address]    Special Requests   Final    NONE Performed at [facility], [address], [place], [place] [address]  Culture (A)  Final    <10,000 COLONIES/mL INSIGNIFICANT GROWTH Performed at [facility], [address], [place], [place] [address]    Report Status 11/11/2024 FINAL  Final    Studies/Results: No results found.  Assessment/Plan: 79 yo woman with pelvic organ prolapse POD1 s/p robotic hysterectomy, BSO, and sacrocolpopexy.  -ambulate -advance diet as tolerated -pain meds -home meds -foley/vaginal packing has been removed.  Will need to void -anticipate discharge later this afternoon pending above criteria have been met   Valli Shank, MD Urology 11/18/2024, 9:13 AM

## 2024-11-18 NOTE — Discharge Summary (Signed)
 Date of admission: 11/17/2024  Date of discharge: 11/18/2024  Admission diagnosis: pelvic organ prolapse  Discharge diagnosis: pelvic organ prolapse  Secondary diagnoses:  Patient Active Problem List   Diagnosis Date Noted   Cystocele with prolapse 11/17/2024   Seborrheic dermatitis 08/03/2024   Iron  deficiency anemia 08/03/2024   History of shingles 05/26/2024   Plantar fasciitis of left foot 04/27/2024   Primary osteoarthritis of left knee 04/27/2024   Bladder distension 10/22/2023   Itching of ear 10/22/2023   Impacted cerumen of left ear 10/22/2023   Facial pain 10/22/2023   Need for influenza vaccination 10/22/2023   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   Facial swelling 12/25/2022   Primary osteoarthritis of right knee 06/21/2022   Chronic pain of right knee 06/14/2022   Left-sided Bell's palsy 12/12/2020   Ptosis of left eyelid 12/12/2020   Left leg paresthesias 12/12/2020   Refusal of blood transfusions as patient is Jehovah's Witness 02/26/2017   Prediabetes 04/11/2016   Vitamin D  deficiency 04/11/2016   Essential hypertension 04/10/2016   Arthritis 04/10/2016   Osteoporosis 04/10/2016   Obesity 04/10/2016   Hyperglycemia 04/10/2016   Environmental allergies 04/10/2016   Language barrier to communication 04/10/2016    Procedures performed: Procedures: HYSTERECTOMY, SUPRACERVICAL, ROBOT-ASSISTED, LAPAROSCOPIC, WITH BILATERAL SALPINGECTOMY SACROCOLPOPEXY, ROBOT-ASSISTED, LAPAROSCOPIC  History and Physical: For full details, please see admission history and physical. Briefly, Elizabeth Jennings is a 79 y.o. year old patient with pelvic organ prolapse.   Hospital Course: Patient tolerated the procedure well.  She was then transferred to the floor after an uneventful PACU stay.  Her hospital course was uncomplicated.  On POD#1 she had met discharge criteria: was eating a regular diet, was up and ambulating independently,  pain was well controlled, was  voiding without a catheter, and was ready to for discharge.   NAD Vitals:   11/18/24 0123 11/18/24 0537 11/18/24 1043 11/18/24 1503  BP: (!) 163/95 (!) 158/79 (!) 192/90 (!) 146/69  Pulse: 76 79 68 68  Resp: 19 17 16    Temp: 98.4 F (36.9 C) 98.7 F (37.1 C) 98 F (36.7 C)   TempSrc: Oral Oral    SpO2: 90% 90% 98%   Weight:      Height:         Intake/Output Summary (Last 24 hours) at 11/18/2024 1902 Last data filed at 11/18/2024 0640 Gross per 24 hour  Intake 1479.19 ml  Output 1775 ml  Net -295.81 ml    Non-labored breathing Abdomen appropriately tender Incision c/d/I Extremity symmetric    Laboratory values:  Recent Labs    11/17/24 1536 11/18/24 0516  HGB 11.7* 12.5  HCT 35.8* 37.5   Recent Labs    11/18/24 0516  NA 134*  K 3.7  CL 100  CO2 21*  GLUCOSE 114*  BUN 13  CREATININE 0.61  CALCIUM 9.1   No results for input(s): LABPT, INR in the last 72 hours. No results for input(s): LABURIN in the last 72 hours. Results for orders placed or performed during the hospital encounter of 11/10/24  Urine Culture     Status: Abnormal   Collection Time: 11/10/24  3:24 PM   Specimen: Urine, Clean Catch  Result Value Ref Range Status   Specimen Description   Final    URINE, CLEAN CATCH Performed at [facility], [address] [place], [place] [address]    Special Requests   Final    NONE Performed at [facility], [address] [place], [place] [address]    Culture (A)  Final    <10,000 COLONIES/mL INSIGNIFICANT GROWTH Performed at [facility], [address], [place], [place] [address]    Report Status 11/11/2024 FINAL  Final    Disposition: Home  Discharge instruction: The patient was instructed to be ambulatory but told to refrain from heavy lifting, strenuous activity, or driving.   Discharge medications:  Allergies as of 11/18/2024   No Known Allergies      Medication List     TAKE  these medications    cetirizine  10 MG tablet Commonly known as: ZyrTEC  Allergy  Take 1 tablet (10 mg total) by mouth daily.   estradiol 0.01 % Crea vaginal cream Commonly known as: ESTRACE Place 1 Applicatorful vaginally 3 (three) times a week. Start taking on: November 19, 2024   fluticasone  50 MCG/ACT nasal spray Commonly known as: FLONASE  Place 2 sprays into both nostrils daily.   Iron  (Ferrous Sulfate ) 325 (65 Fe) MG Tabs Take 325 mg by mouth every other day.   ketoconazole  2 % shampoo Commonly known as: NIZORAL  Apply 1 Application topically 2 (two) times a week.   losartan  50 MG tablet Commonly known as: COZAAR  TAKE 1 TABLET(50 MG) BY MOUTH DAILY   methocarbamol  750 MG tablet Commonly known as: Robaxin -750 Take 1 tablet (750 mg total) by mouth 2 (two) times daily as needed for muscle spasms.   ondansetron  4 MG tablet Commonly known as: Zofran  Take 1 tablet (4 mg total) by mouth every 8 (eight) hours as needed for nausea or vomiting.   sulfamethoxazole -trimethoprim  800-160 MG tablet Commonly known as: BACTRIM  DS Take 1 tablet by mouth 2 (two) times daily. Start taking one day prior to your appointment for your first follow-up and catheter removal.  Continue taking for three days.   traMADol  50 MG tablet Commonly known as: Ultram  Take 1-2 tablets (50-100 mg total) by mouth every 6 (six) hours as needed for moderate pain (pain score 4-6).               Durable Medical Equipment  (From admission, onward)           Start     Ordered   11/18/24 1337  For home use only DME Walker rolling  Once       Question Answer Comment  Walker: With 5 Inch Wheels   Patient needs a walker to treat with the following condition Pelvic organ prolapse quantification stage 2 cystocele      11/18/24 1336            Followup:   Follow-up Information     Cam Morene ORN, MD Follow up.   Specialty: Urology Why: office will call with date and time of appt. Contact  information: [address] [place] [address] [phone]

## 2024-11-18 NOTE — TOC Transition Note (Signed)
 Transition of Care [facility]) - Discharge Note   Patient Details  Name: Elizabeth Jennings MRN: [id number] Date of Birth: [birth year]  Transition of [facility] CM/SW Contact:  Jon ONEIDA Anon, RN Phone Number: 11/18/2024, 2:18 PM   Clinical Narrative:    Pt will discharge home with relatives. Order for DME [facility] placed. Orders sent to Jermaine with [facility]. RW to be delivered to pt hospital room prior to discharge. Family to provide transportation at discharge. No other ICM needs identified at this time. ICM will sign off.    Final next level of care: Home/Self Care Barriers to Discharge: Barriers Resolved   Patient Goals and CMS Choice   CMS Medicare.gov Compare Post Acute Care list provided to:: Patient Choice offered to / list presented to : Patient [employer] ownership interest in [facility].provided to:: Patient    Discharge Placement                  Name of family member notified: Arlee Factor  Daughter, Emergency Contact  [phone] Patient and family notified of of transfer: 11/18/24  Discharge Plan and Services Additional resources added to the After Visit Summary for                  DME Arranged: Walker rolling DME Agency: [employer] Date DME Agency Contacted: 11/18/24 Time DME Agency Contacted: 1338 Representative spoke with at DME Agency: [place] with [employer] HH Arranged: NA HH Agency: NA        Social Drivers of Health (SDOH) Interventions SDOH Screenings   Food Insecurity: No Food Insecurity (11/17/2024)  Housing: Low Risk (11/17/2024)  Transportation Needs: No Transportation Needs (11/17/2024)  Utilities: Not At Risk (11/17/2024)  Alcohol Screen: Low Risk (01/16/2023)  Depression (PHQ2-9): Low Risk (08/03/2024)  Financial Resource Strain: Low Risk (01/16/2023)  Physical Activity: Insufficiently Active (01/16/2023)  Social Connections: Moderately Integrated (11/17/2024)  Stress: No Stress Concern Present  (01/16/2023)  Tobacco Use: Low Risk (11/17/2024)     Readmission Risk Interventions     No data to display

## 2024-11-22 LAB — SURGICAL PATHOLOGY

## 2024-11-23 ENCOUNTER — Ambulatory Visit (INDEPENDENT_AMBULATORY_CARE_PROVIDER_SITE_OTHER): Admitting: Nurse Practitioner

## 2024-11-23 ENCOUNTER — Encounter: Payer: Self-pay | Admitting: Nurse Practitioner

## 2024-11-23 VITALS — BP 144/75 | HR 85 | Temp 97.5°F | Wt 165.0 lb

## 2024-11-23 DIAGNOSIS — K5900 Constipation, unspecified: Secondary | ICD-10-CM | POA: Insufficient documentation

## 2024-11-23 DIAGNOSIS — Z9109 Other allergy status, other than to drugs and biological substances: Secondary | ICD-10-CM

## 2024-11-23 DIAGNOSIS — I10 Essential (primary) hypertension: Secondary | ICD-10-CM | POA: Diagnosis not present

## 2024-11-23 DIAGNOSIS — D509 Iron deficiency anemia, unspecified: Secondary | ICD-10-CM | POA: Diagnosis not present

## 2024-11-23 DIAGNOSIS — N814 Uterovaginal prolapse, unspecified: Secondary | ICD-10-CM

## 2024-11-23 MED ORDER — POLYETHYLENE GLYCOL 3350 17 GM/SCOOP PO POWD: 17.0000 g | Freq: Every day | ORAL | 1 refills | Status: AC | PRN

## 2024-11-23 MED ORDER — IRON (FERROUS SULFATE) 325 (65 FE) MG PO TABS: 325.0000 mg | ORAL_TABLET | ORAL | 0 refills | Status: AC

## 2024-11-23 MED ORDER — LORATADINE 10 MG PO TABS: 10.0000 mg | ORAL_TABLET | Freq: Every day | ORAL | 11 refills | Status: AC

## 2024-11-23 MED ORDER — LOSARTAN POTASSIUM 100 MG PO TABS: 100.0000 mg | ORAL_TABLET | Freq: Every day | ORAL | 1 refills | Status: AC

## 2024-11-23 NOTE — Assessment & Plan Note (Signed)
 Constipation Post-surgery constipation likely due to tramadol  use. - Prescribed Miralax  17 grams daily as needed. - Advised to increase intake of vegetables and water . - Advised to avoid soda.

## 2024-11-23 NOTE — Assessment & Plan Note (Signed)
 Postoperative state following hysterectomy for pelvic organ prolapse Postoperative pain at incision site without infection. - Advised to hold a pillow when coughing to alleviate pain. - Instructed to monitor for signs of infection and contact surgeon if she occurs. Continue Toradol  50 to 100 mg every 6 hours as needed.  Complete full course of Bactrim  ordered  .

## 2024-11-23 NOTE — Assessment & Plan Note (Signed)
 Lab Results  Component Value Date   WBC 7.5 11/10/2024   HGB 12.5 11/18/2024   HCT 37.5 11/18/2024   MCV 86.4 11/10/2024   PLT 237 11/10/2024    Iron  deficiency anemia Previous anemia resolved; iron  supplementation stopped post-surgery. - Ordered iron  level test to determine need for continued supplementation. - Refilled iron  prescription for continued supplementation for a few more months.

## 2024-11-23 NOTE — Assessment & Plan Note (Signed)
 Environmental allergies Intermittent itchy ears and eyes likely due to allergies. - Recommended Claritin  10 mg daily as needed for allergy  symptoms

## 2024-11-23 NOTE — Progress Notes (Signed)
 "  Established Patient Office Visit  Subjective:  Patient ID: Elizabeth Jennings, female    DOB: June 14, 1946  Age: 79 y.o. MRN: [id number]  CC:  Chief Complaint  Patient presents with   Hospitalization Follow-up    Feeling a little better   Ear Problem    Itching     HPI    Discussed the use of AI scribe software for clinical note transcription with the patient, who gave verbal consent to proceed.  History of Present Illness Elizabeth Jennings is a 79 year old female  has a past medical history of Bell's palsy, Hemorrhoids, Hyperlipidemia, Hypertension, Osteoarthritis, Osteoporosis, PONV (postoperative nausea and vomiting), and Prediabetes. who presents for follow-up after a hysterectomy for pelvic organ prolapse.  Interpretation services provided by a medical interpreter  She underwent a hysterectomy for pelvic organ prolapse and was discharged from the hospital on November 18, 2024. Since the procedure, she experiences pain at the incision site, especially when coughing, which causes her fear. She holds the area to alleviate discomfort when coughing.  Her blood pressure has been elevated since the procedure, with readings of 143/73 mmHg and 144/75 mmHg. She is currently taking losartan  50 mg, with her last dose taken at 6 AM today. She does not have a blood pressure machine at home but plans to purchase one.  She experiences itching in her ears and eyes, particularly at night, and has watery eyes. She was prescribed Claritin  for allergies but has run out. No sneezing, runny nose, or scratchy throat.  She reports constipation since the surgery, which she attributes to the tramadol  prescribed for pain. She has not experienced constipation with iron  supplements in the past.  She has a history of anemia and was taking iron  supplements until about a month ago when she ran out. Her blood count was reportedly good at the hospital. She is trying to drink more water  and has stopped consuming soda due to  prediabetes.    Assessment & Plan      Past Medical History:  Diagnosis Date   Bell's palsy    Hemorrhoids    Hyperlipidemia    Hypertension    Osteoarthritis    right knee   Osteoporosis    PONV (postoperative nausea and vomiting)    Prediabetes     Past Surgical History:  Procedure Laterality Date   EYE SURGERY  2017   cataracts both eyes    EYE SURGERY  2018   eye lid surgery    ROBOTIC ASSISTED LAPAROSCOPIC SACROCOLPOPEXY N/A 11/17/2024   Procedure: SACROCOLPOPEXY, ROBOT-ASSISTED, LAPAROSCOPIC;  Surgeon: Cam Morene ORN, MD;  Location: [facility];  Service: Urology;  Laterality: N/A;   ROBOTIC ASSISTED SUPRACERVICAL HYSTERECTOMY WITH BILATERAL SALPINGO OOPHERECTOMY Bilateral 11/17/2024   Procedure: HYSTERECTOMY, SUPRACERVICAL, ROBOT-ASSISTED, LAPAROSCOPIC, WITH BILATERAL SALPINGECTOMY;  Surgeon: Cam Morene ORN, MD;  Location: [facility];  Service: Urology;  Laterality: Bilateral;  ROBOTIC SUPRACERVICAL HYSTERECTOMY WITH BILATERAL SALPINGO-OOPHORECTOMY AND SACROCOLPOPEXY   TOTAL KNEE ARTHROPLASTY Right 06/02/2023   Procedure: RIGHT TOTAL KNEE ARTHROPLASTY;  Surgeon: Jerri Kay HERO, MD;  Location: [facility];  Service: Orthopedics;  Laterality: Right;    Family History  Problem Relation Age of Onset   Breast cancer Neg Hx    BRCA 1/2 Neg Hx     Social History   Socioeconomic History   Marital status: Divorced    Spouse name: Not on file   Number of children: 2   Years of education: Not on file   Highest education level: Not  on file  Occupational History   Not on file  Tobacco Use   Smoking status: Never    Passive exposure: Never   Smokeless tobacco: Never  Vaping Use   Vaping status: Never Used  Substance and Sexual Activity   Alcohol use: No   Drug use: No   Sexual activity: Not Currently    Birth control/protection: None  Other Topics Concern   Not on file  Social History Narrative      Right Handed   Drinks no caffeine   Social Drivers of Health    Tobacco Use: Low Risk (11/23/2024)   Patient History    Smoking Tobacco Use: Never    Smokeless Tobacco Use: Never    Passive Exposure: Never  Financial Resource Strain: Low Risk (01/16/2023)   Overall Financial Resource Strain (CARDIA)    Difficulty of Paying Living Expenses: Not hard at all  Food Insecurity: No Food Insecurity (11/23/2024)   Epic    Worried About Radiation Protection Practitioner of Food in the Last Year: Never true    Ran Out of Food in the Last Year: Never true  Transportation Needs: No Transportation Needs (11/23/2024)   Epic    Lack of Transportation (Medical): No    Lack of Transportation (Non-Medical): No  Physical Activity: Insufficiently Active (01/16/2023)   Exercise Vital Sign    Days of Exercise per Week: 3 days    Minutes of Exercise per Session: 20 min  Stress: No Stress Concern Present (01/16/2023)   [employer] of Occupational Health - Occupational Stress Questionnaire    Feeling of Stress : Not at all  Social Connections: Moderately Integrated (11/17/2024)   Social Connection and Isolation Panel    Frequency of Communication with Friends and Family: More than three times a week    Frequency of Social Gatherings with Friends and Family: More than three times a week    Attends Religious Services: More than 4 times per year    Active Member of Clubs or Organizations: Yes    Attends Banker Meetings: More than 4 times per year    Marital Status: Divorced  Intimate Partner Violence: Not At Risk (11/23/2024)   Epic    Fear of Current or Ex-Partner: No    Emotionally Abused: No    Physically Abused: No    Sexually Abused: No  Depression (PHQ2-9): Low Risk (11/23/2024)   Depression (PHQ2-9)    PHQ-2 Score: 0  Alcohol Screen: Low Risk (01/16/2023)   Alcohol Screen    Last Alcohol Screening Score (AUDIT): 0  Housing: Low Risk (11/23/2024)   Epic    Unable to Pay for Housing in the Last Year: No    Number of Times Moved in the Last Year: 0    Homeless in the  Last Year: No  Utilities: Not At Risk (11/23/2024)   Epic    Threatened with loss of utilities: No  Health Literacy: Not on file    Outpatient Medications Prior to Visit  Medication Sig Dispense Refill   estradiol  (ESTRACE ) 0.01 % CREA vaginal cream Place 1 Applicatorful vaginally 3 (three) times a week. 42.5 g 12   sulfamethoxazole -trimethoprim  (BACTRIM  DS) 800-160 MG tablet Take 1 tablet by mouth 2 (two) times daily. Start taking one day prior to your appointment for your first follow-up and catheter removal.  Continue taking for three days. 6 tablet 0   traMADol  (ULTRAM ) 50 MG tablet Take 1-2 tablets (50-100 mg total) by mouth every 6 (six) hours as  needed for moderate pain (pain score 4-6). 15 tablet 0   losartan  (COZAAR ) 50 MG tablet TAKE 1 TABLET(50 MG) BY MOUTH DAILY 90 tablet 1   fluticasone  (FLONASE ) 50 MCG/ACT nasal spray Place 2 sprays into both nostrils daily. (Patient not taking: Reported on 11/23/2024) 48 g 0   ketoconazole  (NIZORAL ) 2 % shampoo Apply 1 Application topically 2 (two) times a week. (Patient not taking: Reported on 11/23/2024) 120 mL 0   methocarbamol  (ROBAXIN -750) 750 MG tablet Take 1 tablet (750 mg total) by mouth 2 (two) times daily as needed for muscle spasms. (Patient not taking: Reported on 11/23/2024) 20 tablet 2   ondansetron  (ZOFRAN ) 4 MG tablet Take 1 tablet (4 mg total) by mouth every 8 (eight) hours as needed for nausea or vomiting. (Patient not taking: Reported on 11/23/2024) 40 tablet 0   cetirizine  (ZYRTEC  ALLERGY ) 10 MG tablet Take 1 tablet (10 mg total) by mouth daily. (Patient not taking: Reported on 11/23/2024) 90 tablet 3   Iron , Ferrous Sulfate , 325 (65 Fe) MG TABS Take 325 mg by mouth every other day. (Patient not taking: Reported on 11/23/2024) 90 tablet 1   No facility-administered medications prior to visit.    Allergies[1]  ROS Review of Systems  Constitutional:  Negative for appetite change, chills, fatigue and fever.  HENT:  Negative for  congestion, postnasal drip, rhinorrhea and sneezing.   Eyes:  Positive for itching.  Respiratory:  Negative for cough, shortness of breath and wheezing.   Cardiovascular:  Negative for chest pain, palpitations and leg swelling.  Gastrointestinal:  Positive for abdominal pain and constipation. Negative for nausea and vomiting.  Genitourinary:  Negative for dysuria, flank pain and frequency.  Musculoskeletal:  Negative for back pain, joint swelling and myalgias.  Skin:  Negative for color change, pallor, rash and wound.  Neurological:  Negative for dizziness, facial asymmetry, weakness, numbness and headaches.  Psychiatric/Behavioral:  Negative for behavioral problems, confusion, self-injury and suicidal ideas.       Objective:    Physical Exam Vitals and nursing note reviewed.  Constitutional:      General: She is not in acute distress.    Appearance: Normal appearance. She is obese. She is not ill-appearing, toxic-appearing or diaphoretic.  HENT:     Right Ear: Tympanic membrane, ear canal and external ear normal. There is no impacted cerumen.     Left Ear: Tympanic membrane, ear canal and external ear normal. There is no impacted cerumen.     Nose: No congestion or rhinorrhea.     Mouth/Throat:     Mouth: Mucous membranes are moist.     Pharynx: Oropharynx is clear. No oropharyngeal exudate or posterior oropharyngeal erythema.  Eyes:     General: No scleral icterus.       Right eye: No discharge.        Left eye: No discharge.     Extraocular Movements: Extraocular movements intact.     Conjunctiva/sclera: Conjunctivae normal.  Cardiovascular:     Rate and Rhythm: Normal rate and regular rhythm.     Pulses: Normal pulses.     Heart sounds: Normal heart sounds. No murmur heard.    No friction rub. No gallop.  Pulmonary:     Effort: Pulmonary effort is normal. No respiratory distress.     Breath sounds: Normal breath sounds. No stridor. No wheezing, rhonchi or rales.  Chest:      Chest wall: No tenderness.  Abdominal:     General: There is no  distension.     Palpations: Abdomen is soft.     Tenderness: There is abdominal tenderness. There is no right CVA tenderness or left CVA tenderness.     Comments: Incision sites well-approximated clean and dry no redness or drainage noted  Musculoskeletal:        General: No swelling, tenderness, deformity or signs of injury.     Right lower leg: No edema.     Left lower leg: No edema.  Skin:    General: Skin is warm and dry.     Capillary Refill: Capillary refill takes less than 2 seconds.     Coloration: Skin is not jaundiced or pale.     Findings: No bruising, erythema or lesion.  Neurological:     Mental Status: She is alert and oriented to person, place, and time.     Motor: No weakness.  Psychiatric:        Mood and Affect: Mood normal.        Behavior: Behavior normal.        Thought Content: Thought content normal.        Judgment: Judgment normal.     BP (!) 144/75   Pulse 85   Temp (!) 97.5 F (36.4 C)   Wt 165 lb (74.8 kg)   SpO2 96%   BMI 33.33 kg/m  Wt Readings from Last 3 Encounters:  11/23/24 165 lb (74.8 kg)  11/17/24 160 lb (72.6 kg)  11/10/24 160 lb (72.6 kg)    Lab Results  Component Value Date   TSH 2.350 02/28/2022   Lab Results  Component Value Date   WBC 7.5 11/10/2024   HGB 12.5 11/18/2024   HCT 37.5 11/18/2024   MCV 86.4 11/10/2024   PLT 237 11/10/2024   Lab Results  Component Value Date   NA 134 (L) 11/18/2024   K 3.7 11/18/2024   CO2 21 (L) 11/18/2024   GLUCOSE 114 (H) 11/18/2024   BUN 13 11/18/2024   CREATININE 0.61 11/18/2024   BILITOT <0.2 05/25/2024   ALKPHOS 114 05/25/2024   AST 32 05/25/2024   ALT 30 03/25/2023   PROT 7.0 05/25/2024   ALBUMIN 4.2 05/25/2024   CALCIUM 9.1 11/18/2024   ANIONGAP 13 11/18/2024   EGFR 95 05/25/2024   Lab Results  Component Value Date   CHOL 130 12/25/2022   Lab Results  Component Value Date   HDL 48 12/25/2022    Lab Results  Component Value Date   LDLCALC 63 12/25/2022   Lab Results  Component Value Date   TRIG 100 12/25/2022   Lab Results  Component Value Date   CHOLHDL 2.7 12/25/2022   Lab Results  Component Value Date   HGBA1C 6.1 (A) 05/26/2024      Assessment & Plan:   Problem List Items Addressed This Visit       Cardiovascular and Mediastinum   Essential hypertension   BP Readings from Last 3 Encounters:  11/23/24 (!) 144/75  11/18/24 (!) 146/69  11/10/24 (!) 185/92   Blood pressure elevated post-surgery; current losartan  dose insufficient. - Increased losartan  to 100 mg daily. - Advised low-salt, low-fat diet and to avoid salty foods. - Encouraged 30 minutes of moderate exercise, 5 days a week. - Instructed to monitor for dizziness or shortness of breath and report if these occur. - Scheduled follow-up labs in two weeks for kidney function. - Scheduled follow-up appointment in four weeks for blood pressure management.      Relevant Medications  losartan  (COZAAR ) 100 MG tablet   Other Relevant Orders   Basic Metabolic Panel     Genitourinary   Cystocele with prolapse   Postoperative state following hysterectomy for pelvic organ prolapse Postoperative pain at incision site without infection. - Advised to hold a pillow when coughing to alleviate pain. - Instructed to monitor for signs of infection and contact surgeon if she occurs. Continue Toradol  50 to 100 mg every 6 hours as needed.  Complete full course of Bactrim  ordered  .        Other   Environmental allergies   Environmental allergies Intermittent itchy ears and eyes likely due to allergies. - Recommended Claritin  10 mg daily as needed for allergy  symptoms      Relevant Medications   loratadine  (CLARITIN ) 10 MG tablet   Iron  deficiency anemia - Primary   Lab Results  Component Value Date   WBC 7.5 11/10/2024   HGB 12.5 11/18/2024   HCT 37.5 11/18/2024   MCV 86.4 11/10/2024   PLT 237  11/10/2024    Iron  deficiency anemia Previous anemia resolved; iron  supplementation stopped post-surgery. - Ordered iron  level test to determine need for continued supplementation. - Refilled iron  prescription for continued supplementation for a few more months.       Relevant Medications   Iron , Ferrous Sulfate , 325 (65 Fe) MG TABS   Other Relevant Orders   Iron , TIBC and Ferritin Panel   Constipation   Constipation Post-surgery constipation likely due to tramadol  use. - Prescribed Miralax  17 grams daily as needed. - Advised to increase intake of vegetables and water . - Advised to avoid soda.        Relevant Medications   polyethylene glycol powder (GLYCOLAX /MIRALAX ) 17 GM/SCOOP powder    Meds ordered this encounter  Medications   losartan  (COZAAR ) 100 MG tablet    Sig: Take 1 tablet (100 mg total) by mouth daily.    Dispense:  60 tablet    Refill:  1   Iron , Ferrous Sulfate , 325 (65 Fe) MG TABS    Sig: Take 325 mg by mouth every other day.    Dispense:  90 tablet    Refill:  0   polyethylene glycol powder (GLYCOLAX /MIRALAX ) 17 GM/SCOOP powder    Sig: Take 17 g by mouth daily as needed. Dissolve 1 capful (17g) in 4-8 ounces of liquid and take by mouth daily.    Dispense:  510 g    Refill:  1   loratadine  (CLARITIN ) 10 MG tablet    Sig: Take 1 tablet (10 mg total) by mouth daily.    Dispense:  30 tablet    Refill:  11    Follow-up: Return in about 4 weeks (around 12/21/2024) for HTN.    Gayleen Sholtz R Sanya Kobrin, FNP     [1] No Known Allergies  "

## 2024-11-23 NOTE — Patient Instructions (Addendum)
 " 1. Anemia por deficiencia de hierro, tipo de anemia por deficiencia de hierro no especificada (Principal) - Panel de hierro, TIBC y ferritina - Sulfato ferroso, tabletas de 325 mg (65 mg de hierro); Tomar 325 mg por va oral [employer]. Cantidad a dispensar: 90 tabletas; Recargas: 0  2. Hipertensin esencial - Losartn (COZAAR ) tabletas de 100 mg; Tomar 1 tableta (100 mg en total) por va oral diariamente. Cantidad a dispensar: 60 tabletas; Recargas: 1 - Panel metablico bsico; Prximamente  3. Estreimiento, tipo de estreimiento no especificado - Polvo de polietilenglicol (GLYCOLAX /MIRALAX ) 17 g/cucharada; Tomar 17 g por va oral diariamente segn sea necesario. Disolver una medida (17 g) en 4-8 onzas de lquido y tomar por va oral diariamente. Cantidad a dispensar: 510 g; Recargas: 1  4. Alergias ambientales - Loratadina (CLARITIN ) tabletas de 10 mg; Tomar 1 tableta (10 mg en total) por va oral diariamente. Cantidad a dispensar: 30 tabletas; Recargas: 11      1. Iron  deficiency anemia, unspecified iron  deficiency anemia type (Primary) - Iron , TIBC and Ferritin Panel - Iron , Ferrous Sulfate , 325 (65 Fe) MG TABS; Take 325 mg by mouth every other day.  Dispense: 90 tablet; Refill: 0  2. Essential hypertension - losartan  (COZAAR ) 100 MG tablet; Take 1 tablet (100 mg total) by mouth daily.  Dispense: 60 tablet; Refill: 1 - Basic Metabolic Panel; Future  3. Constipation, unspecified constipation type - polyethylene glycol powder (GLYCOLAX /MIRALAX ) 17 GM/SCOOP powder; Take 17 g by mouth daily as needed. Dissolve 1 capful (17g) in 4-8 ounces of liquid and take by mouth daily.  Dispense: 510 g; Refill: 1  4. Environmental allergies - loratadine  (CLARITIN ) 10 MG tablet; Take 1 tablet (10 mg total) by mouth daily.  Dispense: 30 tablet; Refill: 11     Es importante que haga ejercicio regularmente, al menos 30 minutos, 5 veces por semana, segn su tolerancia. Piense en lo que va a  comer y planifique con anticipacin. Elija alimentos frescos, naturales o congelados en lugar de alimentos enlatados, procesados o empaquetados, que suelen ser ms ricos en azcar, sal y grasas. El 70 al 75% de los alimentos que consuma deben ser verduras y frutas. Realice tres comidas a horas fijas y, si lo desea, puede comer bocadillos entre comidas, siempre y cuando sean frutas o verduras. Intente comer durante un perodo de 12 horas, por ejemplo, de 7 a. m. a 7 p. m., y deje de comer despus de su ltima comida del da. Renea schmitz, generalmente alrededor de 64 onzas al da; ninguna otra bebida es tan saludable. La mejor manera de disfrutar del jugo de frutas es comiendo la fruta cytogeneticist.  Gracias por elegir [employer] de [employer] al Paciente; consideramos un privilegio atenderle.    It is important that you exercise regularly at least 30 minutes 5 times a week as tolerated  Think about what you will eat, plan ahead. Choose  clean, green, fresh or frozen over canned, processed or packaged foods which are more sugary, salty and fatty. 70 to 75% of food eaten should be vegetables and fruit. Three meals at set times with snacks allowed between meals, but they must be fruit or vegetables. Aim to eat over a 12 hour period , example 7 am to 7 pm, and STOP after  your last meal of the day. Drink water ,generally about 64 ounces per day, no other drink is as healthy. Fruit juice is best enjoyed in a healthy way, by EATING the fruit.  Thanks for choosing  [facility] we consider it a privelige to serve you.  "

## 2024-11-23 NOTE — Assessment & Plan Note (Signed)
 BP Readings from Last 3 Encounters:  11/23/24 (!) 144/75  11/18/24 (!) 146/69  11/10/24 (!) 185/92   Blood pressure elevated post-surgery; current losartan  dose insufficient. - Increased losartan  to 100 mg daily. - Advised low-salt, low-fat diet and to avoid salty foods. - Encouraged 30 minutes of moderate exercise, 5 days a week. - Instructed to monitor for dizziness or shortness of breath and report if these occur. - Scheduled follow-up labs in two weeks for kidney function. - Scheduled follow-up appointment in four weeks for blood pressure management.

## 2024-11-24 ENCOUNTER — Ambulatory Visit: Payer: Self-pay | Admitting: Nurse Practitioner

## 2024-11-24 LAB — IRON,TIBC AND FERRITIN PANEL
Ferritin: 77 ng/mL (ref 15–150)
Iron Saturation: 14 % — ABNORMAL LOW (ref 15–55)
Iron: 49 ug/dL (ref 27–139)
Total Iron Binding Capacity: 355 ug/dL (ref 250–450)
UIBC: 306 ug/dL (ref 118–369)

## 2024-11-25 ENCOUNTER — Other Ambulatory Visit: Payer: Self-pay

## 2024-11-25 DIAGNOSIS — I10 Essential (primary) hypertension: Secondary | ICD-10-CM

## 2024-11-25 MED ORDER — BLOOD PRESSURE MONITOR DEVI: 1.0000 | Freq: Every day | Status: AC

## 2024-12-07 ENCOUNTER — Other Ambulatory Visit: Payer: Self-pay

## 2024-12-07 DIAGNOSIS — I10 Essential (primary) hypertension: Secondary | ICD-10-CM

## 2024-12-08 LAB — BASIC METABOLIC PANEL WITH GFR
BUN/Creatinine Ratio: 35 — ABNORMAL HIGH (ref 12–28)
BUN: 23 mg/dL (ref 8–27)
CO2: 19 mmol/L — ABNORMAL LOW (ref 20–29)
Calcium: 9.1 mg/dL (ref 8.7–10.3)
Chloride: 107 mmol/L — ABNORMAL HIGH (ref 96–106)
Creatinine, Ser: 0.66 mg/dL (ref 0.57–1.00)
Glucose: 106 mg/dL — ABNORMAL HIGH (ref 70–99)
Potassium: 3.9 mmol/L (ref 3.5–5.2)
Sodium: 142 mmol/L (ref 134–144)
eGFR: 90 mL/min/1.73

## 2024-12-14 ENCOUNTER — Telehealth: Payer: Self-pay | Admitting: Nurse Practitioner

## 2024-12-14 NOTE — Telephone Encounter (Signed)
 Pt has been contacted and advised on the medication changes. She voiced her understanding.

## 2024-12-14 NOTE — Telephone Encounter (Signed)
 LOSARTAN  POTASSIUM 50MG  TAB

## 2024-12-14 NOTE — Telephone Encounter (Signed)
 Please advise medication request is not active on medication list. CB.

## 2024-12-21 ENCOUNTER — Ambulatory Visit: Payer: Self-pay | Admitting: Nurse Practitioner

## 2025-01-12 ENCOUNTER — Ambulatory Visit: Admitting: Nurse Practitioner
